# Patient Record
Sex: Male | Born: 1935
Health system: Southern US, Community
[De-identification: ages and names within clinical notes are randomized; demographics above are authoritative.]

## PROBLEM LIST (undated history)

## (undated) DIAGNOSIS — I2510 Atherosclerotic heart disease of native coronary artery without angina pectoris: Secondary | ICD-10-CM

## (undated) DIAGNOSIS — M1990 Unspecified osteoarthritis, unspecified site: Secondary | ICD-10-CM

## (undated) DIAGNOSIS — F32A Depression, unspecified: Secondary | ICD-10-CM

## (undated) DIAGNOSIS — I214 Non-ST elevation (NSTEMI) myocardial infarction: Secondary | ICD-10-CM

## (undated) DIAGNOSIS — I959 Hypotension, unspecified: Secondary | ICD-10-CM

## (undated) DIAGNOSIS — F419 Anxiety disorder, unspecified: Secondary | ICD-10-CM

## (undated) DIAGNOSIS — F329 Major depressive disorder, single episode, unspecified: Secondary | ICD-10-CM

## (undated) DIAGNOSIS — I10 Essential (primary) hypertension: Secondary | ICD-10-CM

## (undated) DIAGNOSIS — E7800 Pure hypercholesterolemia, unspecified: Secondary | ICD-10-CM

## (undated) DIAGNOSIS — R42 Dizziness and giddiness: Secondary | ICD-10-CM

## (undated) HISTORY — PX: JOINT REPLACEMENT: SHX530

## (undated) HISTORY — DX: Non-ST elevation (NSTEMI) myocardial infarction: I21.4

## (undated) HISTORY — DX: Atherosclerotic heart disease of native coronary artery without angina pectoris: I25.10

## (undated) HISTORY — DX: Dizziness and giddiness: R42

## (undated) HISTORY — DX: Depression, unspecified: F32.A

## (undated) HISTORY — DX: Hypotension, unspecified: I95.9

## (undated) HISTORY — PX: CORONARY ANGIOPLASTY: SHX604

## (undated) HISTORY — PX: KNEE SURGERY: SHX244

## (undated) HISTORY — PX: CATARACT EXTRACTION W/ INTRAOCULAR LENS  IMPLANT, BILATERAL: SHX1307

## (undated) HISTORY — PX: CARDIAC CATHETERIZATION: SHX172

## (undated) HISTORY — PX: HERNIA REPAIR: SHX51

---
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
## 1995-06-08 HISTORY — PX: SHOULDER SURGERY: SHX246

## 2000-07-25 ENCOUNTER — Encounter: Payer: Self-pay | Admitting: Specialist

## 2000-08-01 ENCOUNTER — Inpatient Hospital Stay (HOSPITAL_COMMUNITY): Admission: RE | Admit: 2000-08-01 | Discharge: 2000-08-02 | Payer: Self-pay | Admitting: Specialist

## 2003-04-19 ENCOUNTER — Encounter
Admission: RE | Admit: 2003-04-19 | Discharge: 2003-04-19 | Payer: Self-pay | Admitting: Physical Medicine and Rehabilitation

## 2010-03-31 ENCOUNTER — Inpatient Hospital Stay (HOSPITAL_COMMUNITY): Admission: RE | Admit: 2010-03-31 | Discharge: 2010-04-03 | Payer: Self-pay | Admitting: Orthopedic Surgery

## 2010-04-28 ENCOUNTER — Encounter
Admission: RE | Admit: 2010-04-28 | Discharge: 2010-06-04 | Payer: Self-pay | Source: Home / Self Care | Attending: Orthopedic Surgery | Admitting: Orthopedic Surgery

## 2010-05-28 ENCOUNTER — Encounter
Admission: RE | Admit: 2010-05-28 | Discharge: 2010-06-04 | Payer: Self-pay | Source: Home / Self Care | Attending: Orthopedic Surgery | Admitting: Orthopedic Surgery

## 2010-08-19 LAB — CBC
HCT: 30.1 % — ABNORMAL LOW (ref 39.0–52.0)
HCT: 39.3 % (ref 39.0–52.0)
Hemoglobin: 13.2 g/dL (ref 13.0–17.0)
MCH: 31.9 pg (ref 26.0–34.0)
MCHC: 33.6 g/dL (ref 30.0–36.0)
MCV: 94.9 fL (ref 78.0–100.0)
MCV: 95.6 fL (ref 78.0–100.0)
Platelets: 215 10*3/uL (ref 150–400)
Platelets: 215 10*3/uL (ref 150–400)
Platelets: 279 10*3/uL (ref 150–400)
RBC: 3.15 MIL/uL — ABNORMAL LOW (ref 4.22–5.81)
RBC: 3.46 MIL/uL — ABNORMAL LOW (ref 4.22–5.81)
RBC: 4.14 MIL/uL — ABNORMAL LOW (ref 4.22–5.81)
RDW: 13.5 % (ref 11.5–15.5)
RDW: 13.6 % (ref 11.5–15.5)
WBC: 10.8 10*3/uL — ABNORMAL HIGH (ref 4.0–10.5)
WBC: 12.3 10*3/uL — ABNORMAL HIGH (ref 4.0–10.5)
WBC: 12.7 10*3/uL — ABNORMAL HIGH (ref 4.0–10.5)

## 2010-08-19 LAB — URINALYSIS, ROUTINE W REFLEX MICROSCOPIC
Glucose, UA: NEGATIVE mg/dL
Nitrite: NEGATIVE
Protein, ur: NEGATIVE mg/dL
Urobilinogen, UA: 0.2 mg/dL (ref 0.0–1.0)

## 2010-08-19 LAB — BASIC METABOLIC PANEL
BUN: 32 mg/dL — ABNORMAL HIGH (ref 6–23)
CO2: 31 mEq/L (ref 19–32)
Calcium: 8.5 mg/dL (ref 8.4–10.5)
Calcium: 9.3 mg/dL (ref 8.4–10.5)
Chloride: 104 mEq/L (ref 96–112)
Chloride: 104 mEq/L (ref 96–112)
Creatinine, Ser: 1.47 mg/dL (ref 0.4–1.5)
Creatinine, Ser: 1.62 mg/dL — ABNORMAL HIGH (ref 0.4–1.5)
Creatinine, Ser: 1.71 mg/dL — ABNORMAL HIGH (ref 0.4–1.5)
GFR calc Af Amer: 46 mL/min — ABNORMAL LOW (ref >60)
GFR calc Af Amer: 49 mL/min — ABNORMAL LOW (ref >60)
GFR calc Af Amer: 55 mL/min — ABNORMAL LOW (ref >60)
GFR calc non Af Amer: 38 mL/min — ABNORMAL LOW (ref >60)
GFR calc non Af Amer: 46 mL/min — ABNORMAL LOW (ref >60)
Glucose, Bld: 85 mg/dL (ref 70–99)
Potassium: 4.4 mEq/L (ref 3.5–5.1)
Potassium: 4.7 mEq/L (ref 3.5–5.1)
Sodium: 140 mEq/L (ref 135–145)

## 2010-08-19 LAB — DIFFERENTIAL
Basophils Absolute: 0 10*3/uL (ref 0.0–0.1)
Basophils Relative: 0 % (ref 0–1)
Eosinophils Absolute: 0.1 10*3/uL (ref 0.0–0.7)
Eosinophils Relative: 1 % (ref 0–5)
Lymphocytes Relative: 20 % (ref 12–46)
Lymphs Abs: 2.2 10*3/uL (ref 0.7–4.0)
Monocytes Absolute: 0.8 10*3/uL (ref 0.1–1.0)
Monocytes Relative: 8 % (ref 3–12)
Neutro Abs: 7.6 10*3/uL (ref 1.7–7.7)
Neutrophils Relative %: 70 % (ref 43–77)

## 2010-08-19 LAB — PROTIME-INR
INR: 1.03 (ref 0.00–1.49)
Prothrombin Time: 13.7 seconds (ref 11.6–15.2)

## 2010-08-19 LAB — TYPE AND SCREEN
ABO/RH(D): O POS
Antibody Screen: NEGATIVE

## 2010-08-19 LAB — URINE MICROSCOPIC-ADD ON

## 2010-08-19 LAB — ABO/RH: ABO/RH(D): O POS

## 2010-10-23 NOTE — Op Note (Signed)
[facility]  Patient:    Leon Morris, Leon Morris                    MRN: [id number] Proc. Date: 08/01/00 Adm. Date:  [id number] Disc. Date: [id number] Attending:  Montez Morita, Philips John                           Operative Report  PREOPERATIVE DIAGNOSIS:  Rotator cuff tear, right shoulder.  POSTOPERATIVE DIAGNOSIS:  Severe massive rotator cuff tear of right shoulder.  OPERATION:  Anterior acromioplasty, distal clavicle excision, and rotator cuff repair.  SURGEON:  Philips J. Montez Morita, M.D.  ASSISTANT:  Alexzandrew L. Perkins, P.A.-C.  DESCRIPTION OF PROCEDURE:  After suitable general anesthesia, he was positioned in the shoulder frame and prepped and draped routinely.  An incision was made over the distal clavicle in line with the anterior acromion and about an inch into the deltoid.  The anterior acromion was exposed as was the tip of the clavicle, and both were removed with a saw, beveling the anterior acromion with protection by ends of a Cobb underneath.   Hemostasis all along with a Bovie and bone wax to the end of the clavicle.  A terrible rotator cuff tear with the infraspinatus tendons retracted, very difficult to get to.  Three longitudinal sutures were placed from anterior to posterior in a mattress type so they could be pulled on, and two horizontal sutures of #1 PDS were used to repair the longitudinal defects in three segments.  A semicircular trough was cut from anterior to posterior, and three drill holes were made.  The two sutures in the most posterior portion were passed through that posterior hole, and the two in the middle portion were passed through the middle hole, and in the anterior portion, one through the most anterior hole and one through the middle hole.  At this point, the shoulder was flexed and abducted, and the two sutures through the rear hole were tied to the two sutures through the middle hole, and then the front two sutures  were tied to each other, one having passed through the front hole and one through the middle.  Nice repair and nice pulling of the tendon back into it trough except at the very middle center, there was just a wee bit of gap.  There was nothing really there to close.  It was felt that the underneath portion adhered nicely.  Two drill holes were placed in the acromion for three sutures of PDS #1, and two additional sutures of PDS more proximal to that over the distal clavicle were placed.  A catheter was put in anteriorly, threaded in the pain pump catheter which was folded on itself and placed beneath the sutures.  Once the catheter was in place, the previously inserted sutures were tied down, reapproximating the deltoid.  Remnants of the bursa had been excised all along and were trimmed out.  The subcutaneum was closed with 2-0 PDS, and then 20 cc of 0.5% plain Marcaine was put about the area, and the pain pump was started. Closure of the skin with 4-0 running Monocryl, Steri-Strips, to recovery in good condition.DD:  08/01/00 TD:  08/02/00 Job: [id number]

## 2011-06-12 ENCOUNTER — Emergency Department (HOSPITAL_COMMUNITY): Payer: Medicare Other

## 2011-06-12 ENCOUNTER — Other Ambulatory Visit: Payer: Self-pay

## 2011-06-12 ENCOUNTER — Inpatient Hospital Stay (HOSPITAL_COMMUNITY)
Admission: EM | Admit: 2011-06-12 | Discharge: 2011-06-16 | DRG: 246 | Disposition: A | Discharge status: Home / Self Care | Payer: Medicare Other | Source: Ambulatory Visit | Attending: Cardiology | Admitting: Cardiology

## 2011-06-12 ENCOUNTER — Encounter: Payer: Self-pay | Admitting: *Deleted

## 2011-06-12 DIAGNOSIS — I214 Non-ST elevation (NSTEMI) myocardial infarction: Principal | ICD-10-CM

## 2011-06-12 DIAGNOSIS — Z7901 Long term (current) use of anticoagulants: Secondary | ICD-10-CM

## 2011-06-12 DIAGNOSIS — M129 Arthropathy, unspecified: Secondary | ICD-10-CM | POA: Diagnosis present

## 2011-06-12 DIAGNOSIS — Z79899 Other long term (current) drug therapy: Secondary | ICD-10-CM

## 2011-06-12 DIAGNOSIS — Z87891 Personal history of nicotine dependence: Secondary | ICD-10-CM

## 2011-06-12 DIAGNOSIS — D72829 Elevated white blood cell count, unspecified: Secondary | ICD-10-CM | POA: Diagnosis not present

## 2011-06-12 DIAGNOSIS — I10 Essential (primary) hypertension: Secondary | ICD-10-CM

## 2011-06-12 DIAGNOSIS — D649 Anemia, unspecified: Secondary | ICD-10-CM | POA: Diagnosis not present

## 2011-06-12 DIAGNOSIS — E785 Hyperlipidemia, unspecified: Secondary | ICD-10-CM

## 2011-06-12 DIAGNOSIS — Z6827 Body mass index (BMI) 27.0-27.9, adult: Secondary | ICD-10-CM

## 2011-06-12 DIAGNOSIS — R079 Chest pain, unspecified: Secondary | ICD-10-CM

## 2011-06-12 DIAGNOSIS — I2510 Atherosclerotic heart disease of native coronary artery without angina pectoris: Secondary | ICD-10-CM

## 2011-06-12 DIAGNOSIS — F411 Generalized anxiety disorder: Secondary | ICD-10-CM | POA: Diagnosis present

## 2011-06-12 DIAGNOSIS — Z7982 Long term (current) use of aspirin: Secondary | ICD-10-CM

## 2011-06-12 HISTORY — DX: Essential (primary) hypertension: I10

## 2011-06-12 HISTORY — DX: Pure hypercholesterolemia, unspecified: E78.00

## 2011-06-12 HISTORY — DX: Unspecified osteoarthritis, unspecified site: M19.90

## 2011-06-12 HISTORY — DX: Anxiety disorder, unspecified: F41.9

## 2011-06-12 LAB — POCT I-STAT, CHEM 8
BUN: 25 mg/dL — ABNORMAL HIGH (ref 6–23)
Calcium, Ion: 1.15 mmol/L (ref 1.12–1.32)
Chloride: 102 mEq/L (ref 96–112)
HCT: 39 % (ref 39.0–52.0)
Potassium: 3.9 mEq/L (ref 3.5–5.1)
Sodium: 139 mEq/L (ref 135–145)

## 2011-06-12 LAB — COMPREHENSIVE METABOLIC PANEL
Albumin: 3.5 g/dL (ref 3.5–5.2)
BUN: 21 mg/dL (ref 6–23)
Chloride: 102 mEq/L (ref 96–112)
Creatinine, Ser: 1.29 mg/dL (ref 0.50–1.35)
GFR calc Af Amer: 56 mL/min — ABNORMAL LOW (ref >90)
Glucose, Bld: 116 mg/dL — ABNORMAL HIGH (ref 70–99)
Total Bilirubin: 0.7 mg/dL (ref 0.3–1.2)
Total Protein: 6.9 g/dL (ref 6.0–8.3)

## 2011-06-12 LAB — DIFFERENTIAL
Basophils Absolute: 0 10*3/uL (ref 0.0–0.1)
Basophils Relative: 0 % (ref 0–1)
Eosinophils Relative: 1 % (ref 0–5)
Lymphocytes Relative: 21 % (ref 12–46)
Lymphs Abs: 2.1 10*3/uL (ref 0.7–4.0)
Lymphs Abs: 2.5 10*3/uL (ref 0.7–4.0)
Monocytes Absolute: 1.1 10*3/uL — ABNORMAL HIGH (ref 0.1–1.0)
Monocytes Relative: 9 % (ref 3–12)
Neutro Abs: 8.7 10*3/uL — ABNORMAL HIGH (ref 1.7–7.7)
Neutro Abs: 8.2 10*3/uL — ABNORMAL HIGH (ref 1.7–7.7)
Neutrophils Relative %: 72 % (ref 43–77)

## 2011-06-12 LAB — CBC
HCT: 38.3 % — ABNORMAL LOW (ref 39.0–52.0)
Hemoglobin: 12.6 g/dL — ABNORMAL LOW (ref 13.0–17.0)
MCHC: 32.9 g/dL (ref 30.0–36.0)
MCV: 92.9 fL (ref 78.0–100.0)
Platelets: 271 10*3/uL (ref 150–400)
RBC: 4.09 MIL/uL — ABNORMAL LOW (ref 4.22–5.81)
RBC: 4.2 MIL/uL — ABNORMAL LOW (ref 4.22–5.81)
WBC: 11.6 10*3/uL — ABNORMAL HIGH (ref 4.0–10.5)

## 2011-06-12 LAB — CARDIAC PANEL(CRET KIN+CKTOT+MB+TROPI)
CK, MB: 6.1 ng/mL (ref 0.3–4.0)
CK, MB: 5.2 ng/mL — ABNORMAL HIGH (ref 0.3–4.0)
Relative Index: INVALID (ref 0.0–2.5)
Total CK: 92 U/L (ref 7–232)
Troponin I: 0.6 ng/mL (ref <0.30)
Troponin I: 0.52 ng/mL (ref <0.30)

## 2011-06-12 LAB — APTT
aPTT: 30 seconds (ref 24–37)
aPTT: 101 seconds — ABNORMAL HIGH (ref 24–37)

## 2011-06-12 LAB — POCT I-STAT TROPONIN I: Troponin i, poc: 0.17 ng/mL (ref 0.00–0.08)

## 2011-06-12 LAB — HEPARIN LEVEL (UNFRACTIONATED)
Heparin Unfractionated: 0.51 IU/mL (ref 0.30–0.70)
Heparin Unfractionated: 0.27 IU/mL — ABNORMAL LOW (ref 0.30–0.70)

## 2011-06-12 LAB — PROTIME-INR: INR: 1.01 (ref 0.00–1.49)

## 2011-06-12 LAB — HEMOGLOBIN A1C
Hgb A1c MFr Bld: 6 % — ABNORMAL HIGH (ref <5.7)
Mean Plasma Glucose: 126 mg/dL — ABNORMAL HIGH (ref <117)

## 2011-06-12 MED ORDER — SODIUM CHLORIDE 0.9 % IV SOLN
INTRAVENOUS | Status: DC
  Administered 2011-06-12: 06:00:00 via INTRAVENOUS

## 2011-06-12 MED ORDER — SODIUM CHLORIDE 0.9 % IV SOLN: 1.0000 mL/kg/h | INTRAVENOUS | Status: DC

## 2011-06-12 MED ORDER — ACETAMINOPHEN 325 MG PO TABS: 650.0000 mg | ORAL_TABLET | ORAL | Status: DC | PRN

## 2011-06-12 MED ORDER — ALUM & MAG HYDROXIDE-SIMETH 200-200-20 MG/5ML PO SUSP
30.0000 mL | Freq: Once | ORAL | Status: AC
  Administered 2011-06-12: 30 mL via ORAL
  Filled 2011-06-12: qty 30

## 2011-06-12 MED ORDER — ONDANSETRON HCL 4 MG/2ML IJ SOLN
4.0000 mg | Freq: Four times a day (QID) | INTRAMUSCULAR | Status: DC | PRN
  Administered 2011-06-14: 4 mg via INTRAVENOUS
  Filled 2011-06-12: qty 2

## 2011-06-12 MED ORDER — TICAGRELOR 90 MG PO TABS
180.0000 mg | ORAL_TABLET | ORAL | Status: AC
  Administered 2011-06-12: 180 mg via ORAL
  Filled 2011-06-12: qty 2

## 2011-06-12 MED ORDER — SODIUM CHLORIDE 0.9 % IV SOLN
250.0000 mL | INTRAVENOUS | Status: DC | PRN
  Administered 2011-06-12: 1000 mL via INTRAVENOUS
  Administered 2011-06-13: 250 mL via INTRAVENOUS

## 2011-06-12 MED ORDER — ASPIRIN 300 MG RE SUPP: 300.0000 mg | RECTAL | Status: DC

## 2011-06-12 MED ORDER — HEPARIN SOD (PORCINE) IN D5W 100 UNIT/ML IV SOLN
INTRAVENOUS | Status: AC
  Filled 2011-06-12: qty 250

## 2011-06-12 MED ORDER — SODIUM CHLORIDE 0.9 % IJ SOLN
3.0000 mL | Freq: Two times a day (BID) | INTRAMUSCULAR | Status: DC
Start: 2011-06-12 — End: 2011-06-16
  Administered 2011-06-13 – 2011-06-16 (×6): 3 mL via INTRAVENOUS

## 2011-06-12 MED ORDER — SODIUM CHLORIDE 0.9 % IJ SOLN: 3.0000 mL | INTRAMUSCULAR | Status: DC | PRN

## 2011-06-12 MED ORDER — HEPARIN (PORCINE) IN NACL 100-0.45 UNIT/ML-% IJ SOLN
1200.0000 [IU]/h | INTRAMUSCULAR | Status: DC
  Administered 2011-06-12 (×2): 1000 [IU]/h via INTRAVENOUS
  Filled 2011-06-12 (×3): qty 250

## 2011-06-12 MED ORDER — ROSUVASTATIN CALCIUM 20 MG PO TABS
20.0000 mg | ORAL_TABLET | Freq: Every day | ORAL | Status: DC
  Administered 2011-06-12 – 2011-06-14 (×3): 20 mg via ORAL
  Filled 2011-06-12 (×4): qty 1

## 2011-06-12 MED ORDER — ASPIRIN EC 81 MG PO TBEC
81.0000 mg | DELAYED_RELEASE_TABLET | Freq: Every day | ORAL | Status: DC
  Administered 2011-06-13 – 2011-06-16 (×4): 81 mg via ORAL
  Filled 2011-06-12 (×5): qty 1

## 2011-06-12 MED ORDER — NITROGLYCERIN 0.4 MG SL SUBL
0.4000 mg | SUBLINGUAL_TABLET | SUBLINGUAL | Status: DC | PRN
  Administered 2011-06-13 (×2): 0.4 mg via SUBLINGUAL
  Filled 2011-06-12 (×4): qty 25

## 2011-06-12 MED ORDER — CITALOPRAM HYDROBROMIDE 10 MG PO TABS
10.0000 mg | ORAL_TABLET | Freq: Every day | ORAL | Status: DC
  Administered 2011-06-12 – 2011-06-16 (×5): 10 mg via ORAL
  Filled 2011-06-12 (×5): qty 1

## 2011-06-12 MED ORDER — ASPIRIN 81 MG PO CHEW
324.0000 mg | CHEWABLE_TABLET | ORAL | Status: DC
  Filled 2011-06-12: qty 1

## 2011-06-12 MED ORDER — HEPARIN BOLUS VIA INFUSION
4000.0000 [IU] | Freq: Once | INTRAVENOUS | Status: AC
  Administered 2011-06-12: 4000 [IU] via INTRAVENOUS

## 2011-06-12 MED ORDER — TICAGRELOR 90 MG PO TABS
90.0000 mg | ORAL_TABLET | Freq: Two times a day (BID) | ORAL | Status: DC
  Administered 2011-06-13: 90 mg via ORAL
  Filled 2011-06-12 (×2): qty 1

## 2011-06-12 MED ORDER — ZOLPIDEM TARTRATE 5 MG PO TABS
5.0000 mg | ORAL_TABLET | Freq: Every evening | ORAL | Status: DC | PRN
  Administered 2011-06-12: 5 mg via ORAL
  Filled 2011-06-12: qty 1

## 2011-06-12 MED ORDER — SODIUM CHLORIDE 0.9 % IJ SOLN
3.0000 mL | Freq: Two times a day (BID) | INTRAMUSCULAR | Status: DC
  Administered 2011-06-13 – 2011-06-14 (×2): 3 mL via INTRAVENOUS

## 2011-06-12 MED ORDER — METOPROLOL TARTRATE 25 MG PO TABS
25.0000 mg | ORAL_TABLET | Freq: Two times a day (BID) | ORAL | Status: DC
Start: 2011-06-12 — End: 2011-06-16
  Administered 2011-06-12 – 2011-06-15 (×6): 25 mg via ORAL
  Filled 2011-06-12 (×13): qty 1

## 2011-06-12 NOTE — Progress Notes (Signed)
ANTICOAGULATION CONSULT NOTE - Initial Consult  Pharmacy Consult for Heparin Indication: chest pain/ACS  Assessment: 78 y.o. M who was initiated on heparin for CP/ACS this a.m. The patient received a 4000 unit bolus and was started on drip at rate of 1000 units/hr in the ED by the physician however pharmacy now asked to dose heparin. Per discussion with patient, no recent surgeries or hx CVA. Hgb/Hct/Plt ok. No bleeding noted at this time. Heparin level ~8 hours after drip initiated was therapeutic at 0.51 (goal of 0.3-0.7).   Goal of Therapy:  Heparin level 0.3-0.7 units/ml   Plan:  1. Continue heparin drip at rate of 1000 units/hr (10 ml/hr) 2. Daily heparin levels 3. Will continue to monitor for any signs/symptoms of bleeding and will follow up with heparin level in 8 hours to confirm level therapeutic.   Georgina Pillion, PharmD, BCPS 06/12/2011 4:33 PM     No Known Allergies  Patient Measurements: Height: 5\' 3"  (160 cm) Weight: 147 lb (66.679 kg) IBW/kg (Calculated) : 56.9  Heparin Dosing Weight: 66.7 kg   Vital Signs: Temp: 97.9 F (36.6 C) (01/05 0522) Temp src: Oral (01/05 0522) BP: 149/68 mmHg (01/05 1527) Pulse Rate: 71  (01/05 1527)  Labs:  Basename 06/12/11 1413 06/12/11 1400 06/12/11 1131 06/12/11 0550 06/12/11 0517  HGB 13.3 -- -- 13.3 --  HCT 39.0 -- -- 39.0 38.3*  PLT 271 -- -- -- 268  APTT 101* -- -- -- 30  LABPROT 14.1 -- -- -- 13.5  INR 1.07 -- -- -- 1.01  HEPARINUNFRC -- 0.51 -- -- --  CREATININE 1.29 -- -- 1.40* --  CKTOTAL -- -- 94 -- --  CKMB -- -- 6.1* -- --  TROPONINI -- -- 0.60* -- --   Estimated Creatinine Clearance: 33.1 ml/min (by C-G formula based on Cr of 1.29).  Medical History: Past Medical History  Diagnosis Date  . Anxiety   . Hypertension   . Hypercholesterolemia     Medications:   (Not in a hospital admission) Scheduled:    . alum & mag hydroxide-simeth  30 mL Oral Once  . aspirin  324 mg Oral NOW   Or  . aspirin   300 mg Rectal NOW  . aspirin EC  81 mg Oral Daily  . citalopram  10 mg Oral Daily  . heparin      . heparin  4,000 Units Intravenous Once  . metoprolol tartrate  25 mg Oral BID  . rosuvastatin  20 mg Oral Daily  . sodium chloride  3 mL Intravenous Q12H  . sodium chloride  3 mL Intravenous Q12H  . Ticagrelor  180 mg Oral To Major   Followed by  . Ticagrelor  90 mg Oral BID

## 2011-06-12 NOTE — ED Notes (Signed)
Patient transported to X-ray 

## 2011-06-12 NOTE — ED Notes (Signed)
Family at bedside. 

## 2011-06-12 NOTE — ED Notes (Signed)
Per Lanora Manis in Pharmacy. Keep heparin infusion at the same rate.

## 2011-06-12 NOTE — ED Notes (Signed)
Troponin of .60 and ckmb of6.1 reported to Plunk[place],pa

## 2011-06-12 NOTE — ED Notes (Signed)
Meal tray delivered to pt room at this time.

## 2011-06-12 NOTE — Progress Notes (Signed)
  Patient Name: Zaine Elsass [facility]      SUBJECTIVE: No further chest pain.  Past Medical History  Diagnosis Date  . Anxiety   . Hypertension   . Hypercholesterolemia     PHYSICAL EXAM Filed Vitals:   06/12/11 1200 06/12/11 1300 06/12/11 1400 06/12/11 1527  BP: 146/82 130/80 159/83 149/68  Pulse: 64 64 67 71  Temp:      TempSrc:      Resp: 23 21 13    Height:  5\' 3"  (1.6 m)    Weight:  147 lb (66.679 kg)    SpO2: 98% 98% 99% 97%     Well developed and nourished in no acute distress HENT normal Neck supple with JVP-flat Carotids brisk and full without bruits Clear Regular rate and rhythm, no murmurs or gallops Abd-soft with active BS without hepatomegaly No Clubbing cyanosis edema Skin-warm and dry A & Oriented  Grossly normal sensory and motor function    TELEMETRY: Reviewed telemetry pt in nsr :   No intake or output data in the 24 hours ending 06/12/11 1609  LABS: Basic Metabolic Panel:  Lab 06/12/11 1610 06/12/11 0550  NA 138 139  K 4.0 3.9  CL 102 102  CO2 26 --  GLUCOSE 116* 102*  BUN 21 25*  CREATININE 1.29 1.40*  CALCIUM 9.3 --  MG -- --  PHOS -- --   Cardiac Enzymes:  Basename 06/12/11 1131  CKTOTAL 94  CKMB 6.1*  CKMBINDEX --  TROPONINI 0.60*   CBC:  Lab 06/12/11 1413 06/12/11 0550 06/12/11 0517  WBC 11.6* -- 12.1*  NEUTROABS 8.2* -- 8.7*  HGB 13.3 13.3 12.6*  HCT 39.0 39.0 38.3*  MCV 92.9 -- 93.6  PLT 271 -- 268   PROTIME:  Basename 06/12/11 1413 06/12/11 0517  LABPROT 14.1 13.5  INR 1.07 1.01   Liver Function Tests:  Basename 06/12/11 1413  AST 22  ALT 11  ALKPHOS 89  BILITOT 0.7  PROT 6.9  ALBUMIN 3.5       ASSESSMENT AND PLAN: The patient presented with new onset exertional chest discomfort associated with positive enzymes. His electrocardiogram was normal. He is on heparin and aspirin. We'll add tricagelor will anticipate catheterization on Monday     Signed, Sherryl Manges MD  06/12/2011

## 2011-06-12 NOTE — ED Notes (Signed)
Per EMS:  Pt c/o intermittent CP since 9 am this morning that was pressure in his left chest and radiating down left arm.  NO n/v/d, diaphoresis.  Pain associated with SOB.  No hx ofo angina, MI.

## 2011-06-12 NOTE — ED Notes (Signed)
Meal tray ordered for pt at this time.

## 2011-06-12 NOTE — ED Provider Notes (Signed)
History     CSN: [id number]  Arrival date & time 06/12/11  0451   First MD Initiated Contact with Patient 06/12/11 0507      Chief Complaint  Patient presents with  . Chest Pain    (Consider location/radiation/quality/duration/timing/severity/associated sxs/prior treatment) The history is provided by the patient.   patient's had episodes of chest pain since yesterday. Began yesterday while at the gym. He states he can normally ride the bike for 30 minutes, yesterday at 15 minutes developed left-sided chest pain. It went away with rest. Today the pain has been more frequent. The gym is only to 3 minutes on the bike. He's also had the pain come on at rest and with other exertions. He is pain-free now. The pain will comply. He does not smoke. He has a history of high cholesterol. He does not have any coronary disease he knows of. No cough. The pain is associated with shortness of breath.  Past Medical History  Diagnosis Date  . Anxiety   . Hypertension   . Hypercholesterolemia     Past Surgical History  Procedure Date  . Knee surgery   . Shoulder surgery     No family history on file.  History  Substance Use Topics  . Smoking status: Not on file  . Smokeless tobacco: Not on file  . Alcohol Use:       Review of Systems  Constitutional: Negative for activity change and appetite change.  HENT: Negative for neck stiffness.   Eyes: Negative for pain.  Respiratory: Negative for chest tightness and shortness of breath.   Cardiovascular: Positive for chest pain. Negative for leg swelling.  Gastrointestinal: Negative for nausea, vomiting, abdominal pain and diarrhea.  Genitourinary: Negative for flank pain.  Musculoskeletal: Negative for back pain.  Skin: Negative for rash.  Neurological: Negative for weakness, numbness and headaches.  Psychiatric/Behavioral: Negative for behavioral problems.    Allergies  Review of patient's allergies indicates no known allergies.  Home  Medications   Current Outpatient Rx  Name Route Sig Dispense Refill  . CITALOPRAM HYDROBROMIDE 10 MG PO TABS Oral Take 10 mg by mouth daily.      Marland Kitchen SIMVASTATIN 10 MG PO TABS Oral Take 10 mg by mouth at bedtime.        BP 141/84  Pulse 63  Temp(Src) 97.9 F (36.6 C) (Oral)  Resp 20  SpO2 100%  Physical Exam  Nursing note and vitals reviewed. Constitutional: He is oriented to person, place, and time. He appears well-developed and well-nourished.  HENT:  Head: Normocephalic and atraumatic.  Eyes: EOM are normal. Pupils are equal, round, and reactive to light.  Neck: Normal range of motion. Neck supple.  Cardiovascular: Normal rate, regular rhythm and normal heart sounds.   No murmur heard. Pulmonary/Chest: Effort normal and breath sounds normal.  Abdominal: Soft. Bowel sounds are normal. He exhibits no distension and no mass. There is no tenderness. There is no rebound and no guarding.  Musculoskeletal: Normal range of motion. He exhibits no edema.  Neurological: He is alert and oriented to person, place, and time. No cranial nerve deficit.  Skin: Skin is warm and dry.  Psychiatric: He has a normal mood and affect.    ED Course  Procedures (including critical care time)  Labs Reviewed  POCT I-STAT, CHEM 8 - Abnormal; Notable for the following:    BUN 25 (*)    Creatinine, Ser 1.40 (*)    Glucose, Bld 102 (*)  All other components within normal limits  POCT I-STAT TROPONIN I - Abnormal; Notable for the following:    Troponin i, poc 0.17 (*)    All other components within normal limits  CBC  DIFFERENTIAL  I-STAT, CHEM 8  PROTIME-INR  APTT  I-STAT TROPONIN I   No results found.   1. Non-STEMI (non-ST elevated myocardial infarction)      Date: 06/12/2011  Rate: 61  Rhythm: normal sinus rhythm  QRS Axis: normal  Intervals: normal  ST/T Wave abnormalities: normal  Conduction Disutrbances:none  Narrative Interpretation:   Old EKG Reviewed:  unchanged  CRITICAL CARE Performed by: Billee Cashing   Total critical care time: 30  Critical care time was exclusive of separately billable procedures and treating other patients.  Critical care was necessary to treat or prevent imminent or life-threatening deterioration.  Critical care was time spent personally by me on the following activities: development of treatment plan with patient and/or surrogate as well as nursing, discussions with consultants, evaluation of patient's response to treatment, examination of patient, obtaining history from patient or surrogate, ordering and performing treatments and interventions, ordering and review of laboratory studies, ordering and review of radiographic studies, pulse oximetry and re-evaluation of patient's condition.  MDM  Patient presents with left-sided chest pain for the last 2 days. It initially started after 15 minutes on an exercise bike, then 3 minutes, then at rest. Pain-free now. It woke him tonight. He describes the pain as crushing left-sided. Patient has a good story for unstable angina. He was given aspirin by EMS. Initial troponin was slightly elevated. He'll be admitted to cardiology. His pain-free now. Heparin has been started.        Juliet Rude. Rubin Payor, MD 06/12/11 [phone]

## 2011-06-12 NOTE — H&P (Signed)
Leon Morris is an 78 y.o. male.    Chief Complaint: Chest pain  HPI: 78 y/o male with a PMH of hyperlipidemia presenting with a two day history of escalating angina.  He states that since 1983 he has consistently been able to exercise for 30 to 45 minutes by lifting weights and riding on a stationary bicycle without having any symptoms.  Two days prior to presentation, he complained of chest pain, which he describes as "something sitting on my chest" that started about 15 minutes after he started to exercise.  The chest pain lasted about 20 minutes before spontaneous relief.  Yesterday, he noted that he could only exercise for 3 minutes before experiencing similar chest pain.  His chest pain is associated with shortness of breath, and he has noted that he now has PND and orthopnea for the last 2 days.  He denies any radiation, diaphoresis, palpitation or syncope.  In the ER, his ECG did not show any evidence of acute ischemia.  He is currently chest pain free.   Past Medical History  Diagnosis Date  . Anxiety   . Hypertension   . Hypercholesterolemia     Past Surgical History  Procedure Date  . Knee surgery   . Shoulder surgery     No family history on file. Social History:  does not have a smoking history on file. He does not have any smokeless tobacco history on file. His alcohol and drug histories not on file.  Allergies: No Known Allergies  Medications Prior to Admission  Medication Dose Route Frequency Provider Last Rate Last Dose  . 0.9 %  sodium chloride infusion   Intravenous Continuous Juliet Rude. Pickering, MD 125 mL/hr at 06/12/11 0547    . heparin 100 UNIT/ML infusion           . heparin ADULT infusion 100 units/mL (25000 units/250 mL)  1,000 Units/hr Intravenous Continuous Juliet Rude. Pickering, MD 10 mL/hr at 06/12/11 0642 1,000 Units/hr at 06/12/11 0642  . heparin bolus via infusion 4,000 Units  4,000 Units Intravenous Once Nathan R. Rubin Payor, MD   4,000 Units at  06/12/11 0640   No current outpatient prescriptions on file as of 06/12/2011.    Results for orders placed during the hospital encounter of 06/12/11 (from the past 48 hour(s))  CBC     Status: Abnormal   Collection Time   06/12/11  5:17 AM      Component Value Range Comment   WBC 12.1 (*) 4.0 - 10.5 (K/uL)    RBC 4.09 (*) 4.22 - 5.81 (MIL/uL)    Hemoglobin 12.6 (*) 13.0 - 17.0 (g/dL)    HCT 40.9 (*) 81.1 - 52.0 (%)    MCV 93.6  78.0 - 100.0 (fL)    MCH 30.8  26.0 - 34.0 (pg)    MCHC 32.9  30.0 - 36.0 (g/dL)    RDW 91.4  78.2 - 95.6 (%)    Platelets 268  150 - 400 (K/uL)   DIFFERENTIAL     Status: Abnormal   Collection Time   06/12/11  5:17 AM      Component Value Range Comment   Neutrophils Relative 72  43 - 77 (%)    Neutro Abs 8.7 (*) 1.7 - 7.7 (K/uL)    Lymphocytes Relative 17  12 - 46 (%)    Lymphs Abs 2.1  0.7 - 4.0 (K/uL)    Monocytes Relative 9  3 - 12 (%)    Monocytes Absolute 1.1 (*)  0.1 - 1.0 (K/uL)    Eosinophils Relative 1  0 - 5 (%)    Eosinophils Absolute 0.2  0.0 - 0.7 (K/uL)    Basophils Relative 0  0 - 1 (%)    Basophils Absolute 0.0  0.0 - 0.1 (K/uL)   PROTIME-INR     Status: Normal   Collection Time   06/12/11  5:17 AM      Component Value Range Comment   Prothrombin Time 13.5  11.6 - 15.2 (seconds)    INR 1.01  0.00 - 1.49    APTT     Status: Normal   Collection Time   06/12/11  5:17 AM      Component Value Range Comment   aPTT 30  24 - 37 (seconds)   POCT I-STAT TROPONIN I     Status: Abnormal   Collection Time   06/12/11  5:47 AM      Component Value Range Comment   Troponin i, poc 0.17 (*) 0.00 - 0.08 (ng/mL)    Comment NOTIFIED PHYSICIAN      Comment 3            POCT I-STAT, CHEM 8     Status: Abnormal   Collection Time   06/12/11  5:50 AM      Component Value Range Comment   Sodium 139  135 - 145 (mEq/L)    Potassium 3.9  3.5 - 5.1 (mEq/L)    Chloride 102  96 - 112 (mEq/L)    BUN 25 (*) 6 - 23 (mg/dL)    Creatinine, Ser 4.09 (*) 0.50 - 1.35  (mg/dL)    Glucose, Bld 811 (*) 70 - 99 (mg/dL)    Calcium, Ion 9.14  1.12 - 1.32 (mmol/L)    TCO2 27  0 - 100 (mmol/L)    Hemoglobin 13.3  13.0 - 17.0 (g/dL)    HCT 78.2  95.6 - 21.3 (%)    Dg Chest Port 1 View  06/12/2011  *RADIOLOGY REPORT*  Clinical Data: Shortness of breath, chest pain.  PORTABLE CHEST - 1 VIEW  Comparison: None.  Findings: Heart size upper normal limits to mildly enlarged.  Mild eventration of the right hemi diaphragm.  Linear lung base opacities are mild.  Otherwise, no focal areas of consolidation. Limited osseous evaluation by portable technique demonstrates no definite acute osseous abnormality. Right AC joint DJD.  IMPRESSION: Heart size upper normal limits to mildly enlarged.  Minimal linear lung base opacities, favored to represent scarring or atelectasis.  Original Report Authenticated By: Waneta Martins, M.D.    Review of Systems  Constitutional: Negative.   HENT: Negative.   Eyes: Negative.   Respiratory: Negative.   Cardiovascular: Positive for chest pain and PND. Negative for palpitations, orthopnea, claudication and leg swelling.  Gastrointestinal: Negative.   Musculoskeletal: Negative.   Skin: Negative.   Neurological: Negative.   Endo/Heme/Allergies: Negative.   Psychiatric/Behavioral: Negative.     Blood pressure 166/75, pulse 73, temperature 97.9 F (36.6 C), temperature source Oral, resp. rate 18, SpO2 100.00%. Physical Exam  Constitutional: He is oriented to person, place, and time. He appears well-developed and well-nourished. No distress.  HENT:  Head: Normocephalic.  Eyes: EOM are normal.  Neck: Normal range of motion. Neck supple.  Cardiovascular: Normal rate and regular rhythm.  Exam reveals no gallop and no friction rub.   No murmur heard. Respiratory: Effort normal and breath sounds normal.  GI: Soft. Bowel sounds are normal.  Musculoskeletal: Normal range of  motion.  Neurological: He is alert and oriented to person, place, and  time.  Skin: He is not diaphoretic.  Psychiatric: He has a normal mood and affect.     Assessment/Plan  1. Unstable angina 2. Hyperlipidemia  I will admit patient to cardiology and obtain 3 sets of cardiac markers.  He is already on Aspirin and Heparin drip since he has typical symptoms.  I will start him on beta-blockers and change his statin from Simvastatin to Rosuvastatin.  I will obtain fasting lipids and 2-D echo tomorrow to evaluate his lipids and LV function.  He will undergo a cardiac cath on Monday to define his coronaries.  If he develops uncontrolled chest pain and/or becomes hemodynamically unstable, will send him to the cath lab sooner.  Florentina Marquart E 06/12/2011, 7:23 AM

## 2011-06-13 ENCOUNTER — Other Ambulatory Visit: Payer: Self-pay

## 2011-06-13 ENCOUNTER — Inpatient Hospital Stay (HOSPITAL_COMMUNITY): Payer: Medicare Other

## 2011-06-13 ENCOUNTER — Encounter (HOSPITAL_COMMUNITY)
Admission: EM | Disposition: A | Discharge status: Home / Self Care | Payer: Self-pay | Source: Ambulatory Visit | Attending: Cardiology

## 2011-06-13 DIAGNOSIS — I517 Cardiomegaly: Secondary | ICD-10-CM

## 2011-06-13 DIAGNOSIS — I2510 Atherosclerotic heart disease of native coronary artery without angina pectoris: Secondary | ICD-10-CM

## 2011-06-13 HISTORY — PX: LEFT HEART CATHETERIZATION WITH CORONARY ANGIOGRAM: SHX5451

## 2011-06-13 LAB — CBC
Hemoglobin: 12.1 g/dL — ABNORMAL LOW (ref 13.0–17.0)
MCH: 31.3 pg (ref 26.0–34.0)
MCHC: 33.4 g/dL (ref 30.0–36.0)
Platelets: 246 10*3/uL (ref 150–400)

## 2011-06-13 LAB — LIPID PANEL
LDL Cholesterol: 72 mg/dL (ref 0–99)
Total CHOL/HDL Ratio: 3.2 RATIO
Triglycerides: 84 mg/dL (ref <150)
VLDL: 17 mg/dL (ref 0–40)

## 2011-06-13 LAB — CARDIAC PANEL(CRET KIN+CKTOT+MB+TROPI)
CK, MB: 5.4 ng/mL — ABNORMAL HIGH (ref 0.3–4.0)
Total CK: 101 U/L (ref 7–232)

## 2011-06-13 LAB — HEPARIN LEVEL (UNFRACTIONATED): Heparin Unfractionated: 0.64 IU/mL (ref 0.30–0.70)

## 2011-06-13 SURGERY — LEFT HEART CATHETERIZATION WITH CORONARY ANGIOGRAM
Anesthesia: LOCAL

## 2011-06-13 MED ORDER — NITROGLYCERIN 0.2 MG/ML ON CALL CATH LAB
INTRAVENOUS | Status: AC
  Filled 2011-06-13: qty 1

## 2011-06-13 MED ORDER — NITROGLYCERIN IN D5W 200-5 MCG/ML-% IV SOLN
INTRAVENOUS | Status: AC
  Filled 2011-06-13: qty 250

## 2011-06-13 MED ORDER — LIDOCAINE HCL (PF) 1 % IJ SOLN
INTRAMUSCULAR | Status: AC
  Filled 2011-06-13: qty 30

## 2011-06-13 MED ORDER — SODIUM CHLORIDE 0.9 % IV SOLN
1.0000 mL/kg/h | INTRAVENOUS | Status: AC
  Administered 2011-06-13: 1 mL/kg/h via INTRAVENOUS

## 2011-06-13 MED ORDER — NITROGLYCERIN 0.4 MG SL SUBL
0.4000 mg | SUBLINGUAL_TABLET | SUBLINGUAL | Status: DC | PRN
  Administered 2011-06-13 – 2011-06-14 (×4): 0.4 mg via SUBLINGUAL
  Filled 2011-06-13 (×3): qty 25
  Filled 2011-06-13: qty 50

## 2011-06-13 MED ORDER — VERAPAMIL HCL 2.5 MG/ML IV SOLN
INTRAVENOUS | Status: AC
  Filled 2011-06-13: qty 2

## 2011-06-13 MED ORDER — LORAZEPAM 0.5 MG PO TABS: 0.5000 mg | ORAL_TABLET | Freq: Four times a day (QID) | ORAL | Status: DC | PRN

## 2011-06-13 MED ORDER — MORPHINE SULFATE 4 MG/ML IJ SOLN
4.0000 mg | INTRAMUSCULAR | Status: DC | PRN
  Administered 2011-06-14: 4 mg via INTRAVENOUS
  Filled 2011-06-13: qty 1

## 2011-06-13 MED ORDER — HEPARIN SODIUM (PORCINE) 1000 UNIT/ML IJ SOLN
INTRAMUSCULAR | Status: AC
  Filled 2011-06-13: qty 1

## 2011-06-13 MED ORDER — NITROGLYCERIN IN D5W 200-5 MCG/ML-% IV SOLN
2.0000 ug/min | INTRAVENOUS | Status: DC
  Administered 2011-06-13: 30 ug/min via INTRAVENOUS
  Administered 2011-06-13: 10:00:00 via INTRAVENOUS
  Administered 2011-06-14: 80 ug/min via INTRAVENOUS
  Administered 2011-06-15: 50 ug/min via INTRAVENOUS
  Filled 2011-06-13 (×2): qty 250

## 2011-06-13 MED ORDER — MORPHINE SULFATE 10 MG/ML IJ SOLN
INTRAMUSCULAR | Status: AC
  Filled 2011-06-13: qty 1

## 2011-06-13 MED ORDER — HEPARIN SOD (PORCINE) IN D5W 100 UNIT/ML IV SOLN
1350.0000 [IU]/h | INTRAVENOUS | Status: DC
  Administered 2011-06-14: 1200 [IU]/h via INTRAVENOUS
  Filled 2011-06-13 (×3): qty 250

## 2011-06-13 MED ORDER — FENTANYL CITRATE 0.05 MG/ML IJ SOLN
INTRAMUSCULAR | Status: AC
  Filled 2011-06-13: qty 2

## 2011-06-13 MED ORDER — MIDAZOLAM HCL 2 MG/2ML IJ SOLN
INTRAMUSCULAR | Status: AC
  Filled 2011-06-13: qty 2

## 2011-06-13 MED ORDER — HEPARIN (PORCINE) IN NACL 2-0.9 UNIT/ML-% IJ SOLN
INTRAMUSCULAR | Status: AC
  Filled 2011-06-13: qty 2000

## 2011-06-13 NOTE — Progress Notes (Signed)
12:18 AM   Heparin level now 0.27. (last level most likely bolus effect)   Increase to 1200/hr f/u scheduled am lab.  No bleeding reported   Janice Coffin

## 2011-06-13 NOTE — Op Note (Signed)
Cardiac Catheterization Procedure Note  Name: Leon Morris MRN: [id number] DOB: 1922/06/08  Procedure: Left Heart Cath, Selective Coronary Angiography, LV angiography  Indication: 78 year old white male who presents with a non-ST elevation myocardial infarction. He has refractory chest pain at rest despite optimal medical therapy.   Procedural Details: The right wrist was prepped, draped, and anesthetized with 1% lidocaine. Using the modified Seldinger technique, a 6 French sheath was introduced into the right radial artery. 3 mg of verapamil was administered through the sheath, weight-based unfractionated heparin was administered intravenously. Standard Judkins catheters were used for selective coronary angiography and left ventriculography. Catheter exchanges were performed over an exchange length guidewire. There were no immediate procedural complications. A TR band was used for radial hemostasis at the completion of the procedure.  The patient was transferred to the post catheterization recovery area for further monitoring.  Procedural Findings: Hemodynamics: AO 155/70 with a mean of 109 mmHg LV 153/18 mmHg  Coronary angiography: Coronary dominance: right  Left mainstem: Short without significant disease.  Left anterior descending (LAD): Segmental 80-90% disease in the proximal vessel involving the first septal perforator and first diagonal. There is then a 90% focal stenosis in the mid LAD. The first diagonal has a 60-70% stenosis proximally.  Left circumflex (LCx): 80% segmental disease proximally.  Right coronary artery (RCA): Dominant vessel with a 95% stenosis in the mid vessel. There is TIMI grade 2 flow distally. There is a 60-70% stenosis in the first posterior lateral branch.  Left ventriculography: Left ventricular systolic function demonstrates inferior wall hypokinesis, LVEF is well preserved at 55%, there is no significant mitral regurgitation   Final Conclusions:     1. Severe 3 vessel obstructive atherosclerotic coronary disease. The culprit lesion appears to be in the mid right coronary but he has complex three-vessel disease. 2. Well-preserved left ventricular systolic function with inferior hypokinesis. Recommendations: Recommend surgical evaluation for coronary bypass surgery.  Lynae Pederson [facility] 06/13/2011, 6:00 PM

## 2011-06-13 NOTE — Progress Notes (Signed)
06/13/2011  Pt c/o CP 5/10. Increase Ntg gtt bp 162/102. Increased o2 [place] to 4L.  At 2304 pain 6/10 gave 1 SL Ntg BP 143/91.  At 2309 pt CP 4/10 increased Ntg gtt and gave 2nd SL Ntg.  BP 146/78 Pain decreased to 1/10.  Obtained EKg  ST elevation noted Md Hochrein called aware and no new orders at this time.   Obtained 2nd Ekg improved some.  At 2329 still not pain free 1/10 increased Ntg Gtt to 30 pt became pain free.VS stable will continue to monitor.  Emilie [place] Liter

## 2011-06-13 NOTE — Progress Notes (Signed)
  Echocardiogram 2D Echocardiogram has been performed.  Cathie Beams Deneen 06/13/2011, 12:41 PM

## 2011-06-13 NOTE — Progress Notes (Signed)
Pt used bsc to have bm. After returning to bed after bsc, bp 187/85. 1 minute later at 0809 pt complained of chest pain level 7/10. 1 nitro sl administered, after 5 minutes pain level 2/10 and bp 122/67. After 2 more minutes chest pain free. ekg on the way to perform am ekg.

## 2011-06-13 NOTE — Progress Notes (Signed)
  Patient Name: Leon Morris      SUBJECTIVE: Recurrent chest pain with minimal movement Past Medical History  Diagnosis Date  . Anxiety   . Hypercholesterolemia   . Arthritis   . Shortness of breath     PHYSICAL EXAM Filed Vitals:   06/13/11 0500 06/13/11 0805 06/13/11 0806 06/13/11 0817  BP: 108/51  187/85 122/67  Pulse: 66  63   Temp:  97.6 F (36.4 C)    TempSrc:  Oral    Resp: 16  20   Height:      Weight: 146 lb 6.2 oz (66.4 kg)     SpO2: 97%  96%      Well developed and nourished in no acute distress HENT normal Neck supple with JVP-flat Carotids brisk and full without bruits Clear Regular rate and rhythm, no murmurs or gallops Abd-soft with active BS without hepatomegaly No Clubbing cyanosis edema Skin-warm and dry A & Oriented  Grossly normal sensory and motor function    TELEMETRY: Reviewed telemetry pt in nsr : ECGs demonstrate dynamic T-wave inversions and a hint of elevation in the inferior leads   Intake/Output Summary (Last 24 hours) at 06/13/11 0939 Last data filed at 06/13/11 0800  Gross per 24 hour  Intake  374.9 ml  Output    825 ml  Net -450.1 ml    LABS: Basic Metabolic Panel:  Lab 06/12/11 1413 06/12/11 0550  NA 138 139  K 4.0 3.9  CL 102 102  CO2 26 --  GLUCOSE 116* 102*  BUN 21 25*  CREATININE 1.29 1.40*  CALCIUM 9.3 --  MG -- --  PHOS -- --   Cardiac Enzymes:  Basename 06/12/11 1756 06/12/11 1131  CKTOTAL 92 94  CKMB 5.2* 6.1*  CKMBINDEX -- --  TROPONINI 0.52* 0.60*   CBC:  Lab 06/12/11 1413 06/12/11 0550 06/12/11 0517  WBC 11.6* -- 12.1*  NEUTROABS 8.2* -- 8.7*  HGB 13.3 13.3 12.6*  HCT 39.0 39.0 38.3*  MCV 92.9 -- 93.6  PLT 271 -- 268   PROTIME:  Basename 06/12/11 1413 06/12/11 0517  LABPROT 14.1 13.5  INR 1.07 1.01   Liver Function Tests:  Basename 06/12/11 1413  AST 22  ALT 11  ALKPHOS 89  BILITOT 0.7  PROT 6.9  ALBUMIN 3.5       ASSESSMENT AND PLAN:   The patient presented  with new onset exertional chest discomfort associated with positive enzymes. His electrocardiogram was normal. He is on heparin and aspirin and  Ticagelor.  will add intravenous nitroglycerin   I have spoken with Dr. Jordan. If he has recurrent pain following the addition of IV nitroglycerin, go to the Cath Lab today. Heart rate +/- blood pressure preclude further up titration of his beta blockers and a limits the utility of IV nitroglycerin.  I have reviewed this with the patient.   Signed, Bilal Manzer MD  06/13/2011   

## 2011-06-13 NOTE — Progress Notes (Signed)
06/13/11 0305 At 0228 Pt C/O Left CP  to the Left arm 7/10 pain. Short of breath. Increased O2NC to 4l and 1 Sl NTG given BP 183/99.  0230 BP 174/108 5/10 CP. 0233 2nd SL NTG given BP 129/70.  0240 pain free BP 112/55. EKG obtained NSR with 1st degree AV block noted.  Pt resting will continue to monitor. Md notified aware with no new orders at this time.Emilie [place] Liter

## 2011-06-13 NOTE — Progress Notes (Signed)
   I was called to see the patient late this afternoon in light of recurrent unstable angina symptoms. He has been managed with antiplatelet therapy, heparin, intravenous nitroglycerin. Serial ECGs reviewed showing dynamic ST segment changes, mainly in the inferior leads, suggestive of perhaps an RCA injury current, although not diagnostic for STEMI. Dr. Graciela Husbands was also informed of this, and as a result the patient is being taken to the heart catheterization lab by Dr. Swaziland today. I also spoke with the patient's son and his wife present. Patient had been apprised of the risks and benefits of the procedure. He is in agreement to proceed.   Jonelle Sidle, M.D., F.A.C.C.

## 2011-06-13 NOTE — H&P (View-Only) (Signed)
  Patient Name: Leon Morris [facility]      SUBJECTIVE: Recurrent chest pain with minimal movement Past Medical History  Diagnosis Date  . Anxiety   . Hypercholesterolemia   . Arthritis   . Shortness of breath     PHYSICAL EXAM Filed Vitals:   06/13/11 0500 06/13/11 0805 06/13/11 0806 06/13/11 0817  BP: 108/51  187/85 122/67  Pulse: 66  63   Temp:  97.6 F (36.4 C)    TempSrc:  Oral    Resp: 16  20   Height:      Weight: 146 lb 6.2 oz (66.4 kg)     SpO2: 97%  96%      Well developed and nourished in no acute distress HENT normal Neck supple with JVP-flat Carotids brisk and full without bruits Clear Regular rate and rhythm, no murmurs or gallops Abd-soft with active BS without hepatomegaly No Clubbing cyanosis edema Skin-warm and dry A & Oriented  Grossly normal sensory and motor function    TELEMETRY: Reviewed telemetry pt in nsr : ECGs demonstrate dynamic T-wave inversions and a hint of elevation in the inferior leads   Intake/Output Summary (Last 24 hours) at 06/13/11 0939 Last data filed at 06/13/11 0800  Gross per 24 hour  Intake  374.9 ml  Output    825 ml  Net -450.1 ml    LABS: Basic Metabolic Panel:  Lab 06/12/11 4540 06/12/11 0550  NA 138 139  K 4.0 3.9  CL 102 102  CO2 26 --  GLUCOSE 116* 102*  BUN 21 25*  CREATININE 1.29 1.40*  CALCIUM 9.3 --  MG -- --  PHOS -- --   Cardiac Enzymes:  Basename 06/12/11 1756 06/12/11 1131  CKTOTAL 92 94  CKMB 5.2* 6.1*  CKMBINDEX -- --  TROPONINI 0.52* 0.60*   CBC:  Lab 06/12/11 1413 06/12/11 0550 06/12/11 0517  WBC 11.6* -- 12.1*  NEUTROABS 8.2* -- 8.7*  HGB 13.3 13.3 12.6*  HCT 39.0 39.0 38.3*  MCV 92.9 -- 93.6  PLT 271 -- 268   PROTIME:  Basename 06/12/11 1413 06/12/11 0517  LABPROT 14.1 13.5  INR 1.07 1.01   Liver Function Tests:  Basename 06/12/11 1413  AST 22  ALT 11  ALKPHOS 89  BILITOT 0.7  PROT 6.9  ALBUMIN 3.5       ASSESSMENT AND PLAN:   The patient presented  with new onset exertional chest discomfort associated with positive enzymes. His electrocardiogram was normal. He is on heparin and aspirin and  Ticagelor.  will add intravenous nitroglycerin   I have spoken with Dr. Swaziland. If he has recurrent pain following the addition of IV nitroglycerin, go to the Cath Lab today. Heart rate +/- blood pressure preclude further up titration of his beta blockers and a limits the utility of IV nitroglycerin.  I have reviewed this with the patient.   Signed, Sherryl Manges MD  06/13/2011

## 2011-06-13 NOTE — Progress Notes (Signed)
ANTICOAGULATION CONSULT NOTE - Follow Up Consult  Pharmacy Consult for Heparin Indication: chest pain/ACS  Assessment: 88 yom on heparin for cp/acs. Heparin level 0.64. Slight drop in H/H/Plts no bleeding reported. Noted plans for potential cath today if pt still complaining of chest pain after IV nitro drip started.   Goal of Therapy:  Heparin level 0.3-0.7 units/ml   Plan:  1. Continue heparin at 1200 units/hr (no change) 2. F/u AM Heparin level and CBC  Thank you,  Brett Fairy, PharmD Pager: [phone]  06/13/2011 1:43 PM   No Known Allergies  Patient Measurements: Height: 5\' 3"  (160 cm) Weight: 146 lb 6.2 oz (66.4 kg) IBW/kg (Calculated) : 56.9   Vital Signs: Temp: 97.9 F (36.6 C) (01/06 1130) Temp src: Oral (01/06 1130) BP: 114/62 mmHg (01/06 1015) Pulse Rate: 53  (01/06 1015)  Labs:  Basename 06/13/11 1042 06/13/11 0906 06/13/11 0500 06/12/11 2238 06/12/11 1756 06/12/11 1413 06/12/11 1400 06/12/11 1131 06/12/11 0550 06/12/11 0517  HGB -- 12.1* -- -- -- 13.3 -- -- -- --  HCT -- 36.2* -- -- -- 39.0 -- -- 39.0 --  PLT -- 246 -- -- -- 271 -- -- -- 268  APTT -- -- -- -- -- 101* -- -- -- 30  LABPROT -- -- -- -- -- 14.1 -- -- -- 13.5  INR -- -- -- -- -- 1.07 -- -- -- 1.01  HEPARINUNFRC -- -- 0.64 0.27* -- -- 0.51 -- -- --  CREATININE -- -- -- -- -- 1.29 -- -- 1.40* --  CKTOTAL 101 -- -- -- 92 -- -- 94 -- --  CKMB 5.4* -- -- -- 5.2* -- -- 6.1* -- --  TROPONINI 0.47* -- -- -- 0.52* -- -- 0.60* -- --   Estimated Creatinine Clearance: 33.1 ml/min (by C-G formula based on Cr of 1.29).  Medications:  Scheduled:    . alum & mag hydroxide-simeth  30 mL Oral Once  . aspirin  324 mg Oral NOW   Or  . aspirin  300 mg Rectal NOW  . aspirin EC  81 mg Oral Daily  . citalopram  10 mg Oral Daily  . heparin      . metoprolol tartrate  25 mg Oral BID  . rosuvastatin  20 mg Oral Daily  . sodium chloride  3 mL Intravenous Q12H  . sodium chloride  3 mL Intravenous Q12H  .  Ticagrelor  180 mg Oral To Major   Followed by  . Ticagrelor  90 mg Oral BID   Infusions:    . sodium chloride    . heparin 1,200 Units/hr (06/13/11 0028)  . nitroGLYCERIN 6 mcg/min (06/13/11 1000)  . DISCONTD: sodium chloride 125 mL/hr at 06/12/11 0547   PRN: sodium chloride, acetaminophen, nitroGLYCERIN, nitroGLYCERIN, ondansetron (ZOFRAN) IV, sodium chloride, sodium chloride, zolpidem   Bufford Buttner, Yurianna Tusing N 06/13/2011,1:37 PM

## 2011-06-13 NOTE — Progress Notes (Signed)
Cath revealed severe 3vCAD, necessitating surgical evaluation for CABG.  To resume heparin 8h after sheath pulled per MD orders.  Will resume heparin at previously therapeutic rate of 1200 units/hr.  Check heparin level 8h after drip is resumed. Check daily heparin level and CBC.  Thanks,  Marchelle Folks. Illene Bolus, PharmD, BCPS Clinical Pharmacist Pager: [phone] 06/13/2011 6:58 PM

## 2011-06-13 NOTE — Interval H&P Note (Signed)
History and Physical Interval Note:  06/13/2011 5:08 PM  Leon Morris [facility]  has presented today for surgery, with the diagnosis of Non-stemi. He has continuous chest pain at rest despite optimal medical therapy.  The various methods of treatment have been discussed with the patient and family. After consideration of risks, benefits and other options for treatment, the patient has consented to  Procedure(s): LEFT HEART CATHETERIZATION WITH CORONARY ANGIOGRAM as a surgical intervention .  The patients' history has been reviewed, patient examined, no change in status, stable for surgery.  I have reviewed the patients' chart and labs.  Questions were answered to the patient's satisfaction.     Theron Arista [facility]

## 2011-06-13 NOTE — Progress Notes (Signed)
Pt taken to cath lab

## 2011-06-14 ENCOUNTER — Encounter (HOSPITAL_COMMUNITY)
Admission: EM | Disposition: A | Discharge status: Home / Self Care | Payer: Self-pay | Source: Ambulatory Visit | Attending: Cardiology

## 2011-06-14 ENCOUNTER — Encounter (HOSPITAL_COMMUNITY): Payer: Medicare Other

## 2011-06-14 ENCOUNTER — Other Ambulatory Visit: Payer: Self-pay

## 2011-06-14 DIAGNOSIS — I2510 Atherosclerotic heart disease of native coronary artery without angina pectoris: Secondary | ICD-10-CM

## 2011-06-14 DIAGNOSIS — Z01810 Encounter for preprocedural cardiovascular examination: Secondary | ICD-10-CM

## 2011-06-14 LAB — CARDIAC PANEL(CRET KIN+CKTOT+MB+TROPI)
CK, MB: 14.2 ng/mL (ref 0.3–4.0)
Total CK: 128 U/L (ref 7–232)
Troponin I: 1.2 ng/mL (ref <0.30)

## 2011-06-14 LAB — BASIC METABOLIC PANEL
BUN: 20 mg/dL (ref 6–23)
BUN: 18 mg/dL (ref 6–23)
CO2: 29 mEq/L (ref 19–32)
Calcium: 8.3 mg/dL — ABNORMAL LOW (ref 8.4–10.5)
Calcium: 8.2 mg/dL — ABNORMAL LOW (ref 8.4–10.5)
Creatinine, Ser: 1.56 mg/dL — ABNORMAL HIGH (ref 0.50–1.35)
GFR calc non Af Amer: 39 mL/min — ABNORMAL LOW (ref >90)
GFR calc non Af Amer: 44 mL/min — ABNORMAL LOW (ref >90)
Glucose, Bld: 109 mg/dL — ABNORMAL HIGH (ref 70–99)
Glucose, Bld: 122 mg/dL — ABNORMAL HIGH (ref 70–99)
Sodium: 140 mEq/L (ref 135–145)
Sodium: 137 mEq/L (ref 135–145)

## 2011-06-14 LAB — POCT ACTIVATED CLOTTING TIME: Activated Clotting Time: 165 seconds

## 2011-06-14 SURGERY — PERCUTANEOUS CORONARY STENT INTERVENTION (PCI-S)
Anesthesia: LOCAL

## 2011-06-14 MED ORDER — SODIUM CHLORIDE 0.9 % IV SOLN
INTRAVENOUS | Status: DC
  Administered 2011-06-14: 19:00:00 via INTRAVENOUS

## 2011-06-14 MED ORDER — HEPARIN (PORCINE) IN NACL 2-0.9 UNIT/ML-% IJ SOLN
INTRAMUSCULAR | Status: AC
  Filled 2011-06-14: qty 2000

## 2011-06-14 MED ORDER — NITROGLYCERIN 0.2 MG/ML ON CALL CATH LAB
INTRAVENOUS | Status: AC
  Filled 2011-06-14: qty 1

## 2011-06-14 MED ORDER — HYDRALAZINE HCL 20 MG/ML IJ SOLN
10.0000 mg | INTRAMUSCULAR | Status: DC | PRN
  Filled 2011-06-14: qty 1

## 2011-06-14 MED ORDER — ACETAMINOPHEN 325 MG PO TABS: 650.0000 mg | ORAL_TABLET | ORAL | Status: DC | PRN

## 2011-06-14 MED ORDER — MORPHINE SULFATE 2 MG/ML IJ SOLN
INTRAMUSCULAR | Status: AC
  Administered 2011-06-14: 2 mg via INTRAVENOUS
  Filled 2011-06-14: qty 1

## 2011-06-14 MED ORDER — BIVALIRUDIN 250 MG IV SOLR
INTRAVENOUS | Status: AC
  Filled 2011-06-14: qty 250

## 2011-06-14 MED ORDER — CEFAZOLIN SODIUM 1-5 GM-% IV SOLN
INTRAVENOUS | Status: AC
  Filled 2011-06-14: qty 50

## 2011-06-14 MED ORDER — LIDOCAINE HCL (PF) 1 % IJ SOLN
INTRAMUSCULAR | Status: AC
  Filled 2011-06-14: qty 30

## 2011-06-14 MED ORDER — SODIUM CHLORIDE 0.9 % IJ SOLN
3.0000 mL | Freq: Two times a day (BID) | INTRAMUSCULAR | Status: DC
  Administered 2011-06-14: 3 mL via INTRAVENOUS

## 2011-06-14 MED ORDER — MIDAZOLAM HCL 2 MG/2ML IJ SOLN
INTRAMUSCULAR | Status: AC
  Filled 2011-06-14: qty 2

## 2011-06-14 MED ORDER — MORPHINE SULFATE 2 MG/ML IJ SOLN: 2.0000 mg | INTRAMUSCULAR | Status: DC | PRN

## 2011-06-14 MED ORDER — ROSUVASTATIN CALCIUM 40 MG PO TABS
40.0000 mg | ORAL_TABLET | Freq: Every day | ORAL | Status: DC
  Administered 2011-06-15: 40 mg via ORAL
  Filled 2011-06-14: qty 1

## 2011-06-14 MED ORDER — SODIUM CHLORIDE 0.9 % IV SOLN
INTRAVENOUS | Status: DC
  Administered 2011-06-14: 10 mL via INTRAVENOUS

## 2011-06-14 MED ORDER — SODIUM CHLORIDE 0.9 % IV SOLN: 250.0000 mL | INTRAVENOUS | Status: DC | PRN

## 2011-06-14 MED ORDER — TICAGRELOR 90 MG PO TABS
90.0000 mg | ORAL_TABLET | Freq: Two times a day (BID) | ORAL | Status: DC
  Administered 2011-06-14 – 2011-06-15 (×2): 90 mg via ORAL
  Filled 2011-06-14 (×3): qty 1

## 2011-06-14 MED ORDER — ONDANSETRON HCL 4 MG/2ML IJ SOLN: 4.0000 mg | Freq: Four times a day (QID) | INTRAMUSCULAR | Status: DC | PRN

## 2011-06-14 MED ORDER — FENTANYL CITRATE 0.05 MG/ML IJ SOLN
INTRAMUSCULAR | Status: AC
  Filled 2011-06-14: qty 2

## 2011-06-14 MED ORDER — ATROPINE SULFATE 1 MG/ML IJ SOLN
INTRAMUSCULAR | Status: AC
  Filled 2011-06-14: qty 1

## 2011-06-14 MED ORDER — SODIUM CHLORIDE 0.9 % IJ SOLN: 3.0000 mL | INTRAMUSCULAR | Status: DC | PRN

## 2011-06-14 MED ORDER — TICAGRELOR 90 MG PO TABS
ORAL_TABLET | ORAL | Status: AC
  Filled 2011-06-14: qty 2

## 2011-06-14 NOTE — Progress Notes (Signed)
Nurse called regarding pt c/o increased chest pain rated at a 10/10. EKG drawn revealed precordial ST depressions, inferolateral ST elevations. He underwent cath on 01/06 revealing severe 3-vessel obstructive disease with the culprit lesion suspected to be the mid-RCA; preserved LVEF 55%. Loading dose of Brilinta was given 01/05, maintenance dose given 01/06. He was titrated to NTG IV and received Morphine 4mg  inj with mild relief to 8/10. An additional Morphine 2 mg inj was given. Chest pain reduced to 4/10 with BP in 140s systolic. Pt states BP runs in the 90s/50s at home. Pain rechecked 10 minutes later, decreased to 0/10. Will order repeat EKG. Spoke with Dr. Tenny Craw who subsequently spoke to [facility] regarding CABG scheduling. The limiting factor is Brilinta. Threshold for balloon pump is being discussed currently. Will continue to monitor.  Seen by Dr. Tenny Craw and Dr. Clifton James. Discussed case with TCTS. Will proceed to cath today for attempted PCI of LAD, RCA lesions. Pre-cath orders entered. Pt agrees and is currently being consented for the procedure.   Jacqulyn Bath, PA-C 06/14/2011 3:46 PM   I have seen this patient with Hurman Horn, PA-C. He is having recurrent chest pain. He has critical lesions in the RCA and LAD. Diagnostic cath per Dr. Swaziland on 06/12/10. He was referred for CABG but this has been delayed since he was loaded with Brilinta and his surgical bleeding risk is too high. He has been seen today by Dr. Laneta Simmers and this was confirmed. I believe that urgent PCI is necessary if CABG cannot be performed today. To cath lab for PCI of RCA and possibly the LAD. He is high risk for PCI. He understands this and agrees to proceed. Family present at bedside.   [name] 4:53 PM 06/14/2011

## 2011-06-14 NOTE — Progress Notes (Addendum)
VASCULAR LAB PRELIMINARY  PRELIMINARY  PRELIMINARY  PRELIMINARY  Pre-op Cardiac Surgery  Carotid Findings:   Bilateral:  No ICA stenosis.  Vertebrals antegrade.   Upper Extremity Right Left  Brachial Pressures 110 Not taken due to IV placement  Radial Waveforms Tri Tri  Ulnar Waveforms Tri Tri  Palmar Arch (Allen's Test) Waveform remains normal with radial and ulnar compression. Waveform remains normal with radial compression and decreases >50% with ulnar compression.   Findings:      Lower  Extremity Right Left  Dorsalis Pedis Bilateral palpable pulses.    Anterior Tibial    Posterior Tibial    Ankle/Brachial Indices      Findings:   Bilateral palpable pulses.    Thereasa Parkin, RVT Farrel Demark, [place] 06/14/2011 1:50 PM

## 2011-06-14 NOTE — H&P (View-Only) (Signed)
  Patient Name: Leon Morris      SUBJECTIVE: Patient had several episodes of recurrent chest pain during the night. Lasting 15-25 minutes. Moved to ICU bed to titrate NTG drip. Now pain free.  Past Medical History  Diagnosis Date  . Anxiety   . Hypercholesterolemia   . Arthritis   . Shortness of breath     PHYSICAL EXAM Filed Vitals:   06/14/11 0400 06/14/11 0500 06/14/11 0600 06/14/11 0700  BP: 119/52 102/54 102/54 111/69  Pulse: 55 53 50 61  Temp: 98.6 F (37 C)     TempSrc: Oral     Resp: 18 16 16 19  Height:      Weight:   69.2 kg (152 lb 8.9 oz)   SpO2: 100% 100% 99% 100%     Well developed and nourished in no acute distress HENT normal Neck supple with JVP-flat Carotids brisk and full without bruits Lungs: Clear CV: Regular rate and rhythm, no murmurs or gallops Abd-soft with active BS without hepatomegaly No Clubbing cyanosis edema, right wrist without hematoma Skin-warm and dry A & Oriented  Grossly normal sensory and motor function    TELEMETRY: Reviewed telemetry sinus bradycardia. : ECGs demonstrate sinus brady. Q waves in III and avl. Slight ST elevation in these leads.   Intake/Output Summary (Last 24 hours) at 06/14/11 0836 Last data filed at 06/14/11 0600  Gross per 24 hour  Intake 1182.85 ml  Output   1200 ml  Net -17.15 ml    LABS: Basic Metabolic Panel:  Lab 06/12/11 1413 06/12/11 0550  NA 138 139  K 4.0 3.9  CL 102 102  CO2 26 --  GLUCOSE 116* 102*  BUN 21 25*  CREATININE 1.29 1.40*  CALCIUM 9.3 --  MG -- --  PHOS -- --   Cardiac Enzymes:  Basename 06/13/11 1042 06/12/11 1756 06/12/11 1131  CKTOTAL 101 92 94  CKMB 5.4* 5.2* 6.1*  CKMBINDEX -- -- --  TROPONINI 0.47* 0.52* 0.60*   CBC:  Lab 06/13/11 0906 06/12/11 1413 06/12/11 0550 06/12/11 0517  WBC 11.5* 11.6* -- 12.1*  NEUTROABS -- 8.2* -- 8.7*  HGB 12.1* 13.3 13.3 12.6*  HCT 36.2* 39.0 39.0 38.3*  MCV 93.8 92.9 -- 93.6  PLT 246 271 -- 268    PROTIME:  Basename 06/12/11 1413 06/12/11 0517  LABPROT 14.1 13.5  INR 1.07 1.01   Liver Function Tests:  Basename 06/12/11 1413  AST 22  ALT 11  ALKPHOS 89  BILITOT 0.7  PROT 6.9  ALBUMIN 3.5       ASSESSMENT AND PLAN: 1. NSTEMI with multiple recurrent episodes of chest pain despite optimal medical Rx. On IV heparin and Ntg. Well beta blocked. Ticagrelor held for surgery.  Awaiting CV surgery consult. Patient has severe 3 vessel CAD by cath. LV function is good. 2. Hyperlipidemia on statin.     Signed, Peter Jordan MD, FACC 06/14/2011   

## 2011-06-14 NOTE — Interval H&P Note (Signed)
History and Physical Interval Note:  06/14/2011 4:48 PM  The patient is here today for PCI of the RCA and possibly the LAD. He is having ongoing angina with critical stenoses in the RCA and the LAD. He is not a candidate for CABG for the next 5 days because he was preloaded with Brilinta and the bleeding risk is too high with CABG. He has been seen by Dr. Laneta Simmers today and this has been confirmed.  I have personally reviewed the labs and examined the patient. The History and Physical has been reviewed and there are no changes. The risks and benefits of the procedure have been reviewed with the patient. Informed consent has been signed by the patient and is present in the chart. The patient agrees to proceed with the procedure.      MCALHANY,CHRISTOPHER

## 2011-06-14 NOTE — Progress Notes (Signed)
06/14/2011 Pt C/O CP 6/10 Nausea and diaphoretic BP 160/86 increased NTG gtt to 35 no relief gave 1 Sl Ntg.  At 0115 BP 153/83 2/10 CP.  Increased NTG to 40. Md Paged to bedside.  0119 BP 130/76 pain free and nausea gone. Still gave 4 zofran.  Md at bedside will move to ICU.  Will Continue to monitor. Emilie [place] Liter

## 2011-06-14 NOTE — Progress Notes (Signed)
06/14/2011 0150 Pt moved to ICU and family called. VS stable pt remains pain free. Report given to RN Janee Morn, Shirlee Limerick

## 2011-06-14 NOTE — Consult Note (Signed)
[address]            [place] [address]          [phone]       Leon Morris [facility] Medical Record #[id number] Date of Birth: Dec 26, 1922  Referring physician:  Peter Swaziland, MD   Chief Complaint:    Chief Complaint  Patient presents with  . Chest Pain    History of Present Illness:      The patient is an 52 her old gentleman with a history of hyperlipidemia who is quite active going to the gym 5 days per week and lifting weights and riding a stationary bicycle. He said he never had any problem at the gym until last Thursday when he developed substernal chest pressure and fatigue after about 15 minutes of exercise. This lasted about 20 minutes and resolves spontaneously. He never had these symptoms before. Then on Friday he went back to the gym and after about 3 minutes he developed similar symptoms and stopped. At the time of presentation he was chest pain free and had no ischemic EKG changes. Yesterday he developed recurrent chest pain and was taken the catheterization lab by Dr. Swaziland which showed severe three-vessel coronary disease with the culprit appearing to be a very tight mid right coronary stenosis. The patient had received a loading dose of Brilinta as well as one additional dose. The patient was returned to the CCU in continued on heparin and nitroglycerin. He was free of chest pain most of the day and then this afternoon had 10 out of 10 chest pain.   Current Activity/ Functional Status: Patient is independent with mobility/ambulation, transfers, ADL's, IADL's.   Past Medical History  Diagnosis Date  . Anxiety   . Hypercholesterolemia   . Arthritis   . Shortness of breath     Past Surgical History  Procedure Date  . Knee surgery   . Shoulder surgery   . Hernia repair   . Joint replacement     History  Smoking status  . Former Smoker  . Quit date: 05/18/1941  Smokeless tobacco  . Not on file      History  Alcohol Use No    History   Social History  . Marital Status: Widowed    Spouse Name: N/A    Number of Children: N/A  . Years of Education: N/A   Occupational History  . Not on file.   Social History Main Topics  . Smoking status: Former Smoker    Quit date: 05/18/1941  . Smokeless tobacco: Not on file  . Alcohol Use: No  . Drug Use: No  . Sexually Active: No   Other Topics Concern  . Not on file   Social History Narrative  . No narrative on file    No Known Allergies  Current Facility-Administered Medications  Medication Dose Route Frequency Provider Last Rate Last Dose  . 0.9 %  sodium chloride infusion  1 mL/kg/hr Intravenous Continuous Peter Swaziland, MD 66.4 mL/hr at 06/13/11 2032 1 mL/kg/hr at 06/13/11 2032  . 0.9 %  sodium chloride infusion   Intravenous Continuous Rollene Rotunda, MD 10 mL/hr at 06/14/11 0127 10 mL at 06/14/11 0127  . acetaminophen (TYLENOL) tablet 650 mg  650 mg Oral Q4H PRN Juliet Rude. Pickering, MD      . aspirin EC tablet 81 mg  81  mg Oral Daily Juliet Rude. Pickering, MD   81 mg at 06/14/11 1116  . bivalirudin (ANGIOMAX) 250 MG injection           . citalopram (CELEXA) tablet 10 mg  10 mg Oral Daily Juliet Rude. Pickering, MD   10 mg at 06/14/11 1003  . fentaNYL (SUBLIMAZE) 0.05 MG/ML injection           . heparin 1000 UNIT/ML injection           . heparin 2-0.9 UNIT/ML-% infusion           . heparin 2-0.9 UNIT/ML-% infusion           . heparin ADULT infusion 100 units/ml (25000 units/250 ml)  1,350 Units/hr Intravenous Continuous Judie Bonus Hammons, PHARMD 13.5 mL/hr at 06/14/11 1229 1,350 Units/hr at 06/14/11 1229  . lidocaine (XYLOCAINE) 1 % injection           . lidocaine (XYLOCAINE) 1 % injection           . LORazepam (ATIVAN) tablet 0.5 mg  0.5 mg Oral Q6H PRN Peter Swaziland, MD      . metoprolol tartrate (LOPRESSOR) tablet 25 mg  25 mg Oral BID Juliet Rude. Pickering, MD   25 mg at 06/14/11 1003  . midazolam (VERSED) 2 MG/2ML  injection           . morphine 10 MG/ML injection           . morphine 2 MG/ML injection 2 mg  2 mg Intravenous Q4H PRN Odella Aquas      . morphine 2 MG/ML injection        2 mg at 06/14/11 1520  . morphine 4 MG/ML injection 4 mg  4 mg Intravenous Q1H PRN Peter Swaziland, MD   4 mg at 06/14/11 1439  . nitroGLYCERIN (NITROSTAT) SL tablet 0.4 mg  0.4 mg Sublingual Q5 min PRN Luis Abed, MD   0.4 mg at 06/14/11 0114  . nitroGLYCERIN (NTG ON-CALL) 0.2 mg/mL injection           . nitroGLYCERIN (NTG ON-CALL) 0.2 mg/mL injection           . nitroGLYCERIN 0.2 mg/mL in dextrose 5 % infusion  2-200 mcg/min Intravenous Titrated Duke Salvia, MD 36 mL/hr at 06/14/11 1600 120 mcg/min at 06/14/11 1600  . ondansetron (ZOFRAN) injection 4 mg  4 mg Intravenous Q6H PRN Juliet Rude. Pickering, MD   4 mg at 06/14/11 0125  . rosuvastatin (CRESTOR) tablet 20 mg  20 mg Oral Daily Juliet Rude. Pickering, MD   20 mg at 06/14/11 1116  . sodium chloride 0.9 % injection 3 mL  3 mL Intravenous Q12H Nathan R. Pickering, MD   3 mL at 06/14/11 1005  . sodium chloride 0.9 % injection 3 mL  3 mL Intravenous PRN Juliet Rude. Pickering, MD      . sodium chloride 0.9 % injection 3 mL  3 mL Intravenous Q12H Duke Salvia, MD   3 mL at 06/13/11 2130  . sodium chloride 0.9 % injection 3 mL  3 mL Intravenous PRN Duke Salvia, MD      . verapamil (ISOPTIN) 2.5 MG/ML injection           . zolpidem (AMBIEN) tablet 5 mg  5 mg Oral QHS PRN Laurann Montana, PA   5 mg at 06/12/11 2214  . DISCONTD: 0.9 %  sodium chloride infusion  250 mL Intravenous PRN Duke Salvia,  MD   250 mL at 06/13/11 1408  . DISCONTD: 0.9 %  sodium chloride infusion  1 mL/kg/hr Intravenous Continuous Luis Abed, MD      . DISCONTD: aspirin chewable tablet 324 mg  324 mg Oral NOW Juliet Rude. Pickering, MD      . DISCONTD: aspirin suppository 300 mg  300 mg Rectal NOW Juliet Rude. Pickering, MD      . DISCONTD: heparin ADULT infusion 100 units/mL (25000 units/250 mL)  1,200  Units/hr Intravenous Continuous Janice Coffin, RPH 12 mL/hr at 06/13/11 0028 1,200 Units/hr at 06/13/11 0028  . DISCONTD: nitroGLYCERIN (NITROSTAT) SL tablet 0.4 mg  0.4 mg Sublingual Q5 min PRN Juliet Rude. Pickering, MD   0.4 mg at 06/13/11 0454  . DISCONTD: Ticagrelor (BRILINTA) tablet 90 mg  90 mg Oral BID Duke Salvia, MD   90 mg at 06/13/11 6295     History reviewed. No pertinent family history.   Review of Systems:     Cardiac Review of Systems: Y or N  Chest Pain [  y  ]  Resting SOB [n   ] Exertional SOB  [ y ]  Pollyann Kennedy Milo.Brash ]   Pedal Edema Milo.Brash  ]    Palpitations [ n ] Syncope  [n  ]  Presyncope [n ]   General Review of Systems: [Y] = yes [  ]=no  Constitional: recent weight change [n]; anorexia [ n ]; fatigue [ n ]; nausea [n ]; night sweats [n  ]; fever [n  ]; or chills [ n ];                                                                                                                                          Dental: poor dentition[n  ];    Eye : blurred vision [n ]; diplopia Milo.Brash ]; vision changes [ n ];  Amaurosis fugax[ n];  Resp: cough [n  ];  wheezing[ n ];  hemoptysis[ n ]; shortness of breath[ n ]; paroxysmal nocturnal dyspnea[ n ]; dyspnea on exertion[ y]; or orthopnea[ n ];   GI:  gallstones[ n], vomiting[ n ];  dysphagia[n]; melena[ n ];  hematochezia [n ]; heartburn[ n ];   Hx of  Colonoscopy[  n];  GU: kidney stones [n  ]; hematuria[n  ];   dysuria [n ];  nocturia[  n];  history of     obstruction [n  ];              Skin: rash, swelling[ n ];, hair loss[n];  peripheral edema[n];  or itching[ n ];  Musculosketetal: myalgias[ n ];  joint swelling[ n ];  joint erythema[ n ];  joint pain[ y ] s/p left knee replacement last year;  back pain[ n ];   Heme/Lymph: bruising[ n ];  bleeding[ n;  anemia[ n ];   Neuro: TIA[  n ];  headaches[  n];  stroke[  n];  vertigo[ n ];  seizures[ n ];   paresthesias[  n];  difficulty walking[n  ];   Psych:depression[ n];  anxiety[ n ];   Endocrine: diabetes[  n  thyroid dysfunction[ n ]   Immunizations: Flu [ n ]; Pneumococcal[ n ];   Other:  Physical Exam: BP 110/42  Pulse 57  Temp(Src) 97.9 F (36.6 C) (Oral)  Resp 14  Ht 5\' 3"  (1.6 m)  Wt 69.2 kg (152 lb 8.9 oz)  BMI 27.02 kg/m2  SpO2 98%  General appearance: alert and cooperative HEENT: Normocephalic and atraumatic. Pupils are equal reactive to light and accommodation. Extraocular muscles are intact. Oropharynx is clear. Neck: Carotid pulses are palpable bilaterally. There no bruits. There is no adenopathy or thyromegaly. Neurologic: intact Heart: regular rate and rhythm, S1, S2 normal, no murmur, click, rub or gallop Lungs: clear to auscultation bilaterally Abdomen: soft, non-tender; bowel sounds normal; no masses,  no organomegaly Extremities: swelling around left knee with scar from TKR   Diagnostic Studies & Laboratory data:    Procedural Findings: Hemodynamics: AO 155/70 with a mean of 109 mmHg LV 153/18 mmHg  Coronary angiography: Coronary dominance: right  Left mainstem: Short without significant disease.  Left anterior descending (LAD): Segmental 80-90% disease in the proximal vessel involving the first septal perforator and first diagonal. There is then a 90% focal stenosis in the mid LAD. The first diagonal has a 60-70% stenosis proximally.  Left circumflex (LCx): 80% segmental disease proximally.  Right coronary artery (RCA): Dominant vessel with a 95% stenosis in the mid vessel. There is TIMI grade 2 flow distally. There is a 60-70% stenosis in the first posterior lateral branch.  Left ventriculography: Left ventricular systolic function demonstrates inferior wall hypokinesis, LVEF is well preserved at 55%, there is no significant mitral regurgitation   Final Conclusions:   1. Severe 3 vessel obstructive atherosclerotic coronary disease. The culprit lesion appears to be in the mid right coronary but he has complex  three-vessel disease. 2. Well-preserved left ventricular systolic function with inferior hypokinesis. Recommendations: Recommend surgical evaluation for coronary bypass surgery.    Recent Radiology Findings:   No results found.    Recent Lab Findings: Lab Results  Component Value Date   WBC 11.5* 06/13/2011   HGB 12.1* 06/13/2011   HCT 36.2* 06/13/2011   PLT 246 06/13/2011   GLUCOSE 109* 06/14/2011   CHOL 130 06/13/2011   TRIG 84 06/13/2011   HDL 41 06/13/2011   LDLCALC 72 06/13/2011   ALT 11 06/12/2011   AST 22 06/12/2011   NA 140 06/14/2011   K 4.1 06/14/2011   CL 104 06/14/2011   CREATININE 1.56* 06/14/2011   BUN 20 06/14/2011   CO2 29 06/14/2011   TSH 2.446 06/12/2011   INR 1.07 06/12/2011   HGBA1C 6.0* 06/12/2011      Assessment / Plan:      The patient has severe three-vessel coronary disease with the culprit appearing to be a 95-99% mid right coronary stenosis with TIMI 2 flow distally. He also a significant high-grade LAD stenosis and 80% left circumflex stenosis. He had been very active until this past Thursday without any symptoms at all and I suspect he has had a ruptured plaque in his mid right coronary artery. While coronary bypass surgery may be indicated for three-vessel coronary disease in this patient, he has received a loading dose and 1 further dose of Brilinta. He would be at  high risk for severe hemorrhage with coronary bypass surgery at this time and ideally we should wait at least 5 days before performing surgery. He continues to have chest pain and EKG changes. I discussed the case with Dr. Clifton James and he feels that the right coronary artery and LAD are both amenable to PCI. Therefore I would agree that acute PCI of the right coronary lesion would be the best treatment for this patient using a drug-eluting stent, followed by stenting of the LAD. This will be performed by Dr. Clifton James this afternoon.      @[email or web address] @ 06/14/2011 4:55 PM

## 2011-06-14 NOTE — Progress Notes (Signed)
Orthopedic Tech Progress Note Patient Details:  Leon Morris Leon Morris Jul 13, 1922 [id number]  Other Ortho Devices Type of Ortho Device: Knee Immobilizer Ortho Device Location: right leg Ortho Device Interventions: Application   Nikki Dom 06/14/2011, 6:45 PM

## 2011-06-14 NOTE — Procedures (Signed)
Cardiac Catheterization Operative Report  Leon Morris [facility] [id number] 1/7/20136:03 PM No primary provider on file.  Procedure Performed:  1. PTCA/DES x 1 mid RCA 2. PTCA/DES x 2 mid LAD, PTCA/DES x 1 proximal LAD  Operator: Verne Carrow, MD  Indication: Admitted with unstable angina, NSTEMI and found to have severe CAD by cath yesterday (cath per Dr. Swaziland). CT surgery consulted for CABG but pt had been loaded with Brilinta so surgery plans delayed for at least 5 days. Pt had recurrent pain today with dynamic EKG changes. Discussed case with Dr. Laneta Simmers. Elected to perform PCI.                                     Procedure Details: The risks, benefits, complications, treatment options, and expected outcomes were discussed with the patient. The patient and/or family concurred with the proposed plan, giving informed consent. The patient was brought to the cath lab after IV hydration was begun and oral premedication was given. The patient was further sedated with Versed. The right groin was prepped and draped in the usual manner. Using the modified Seldinger access technique, a 6 French sheath was placed in the right femoral artery. The patient was given a bolus of Angiomax and a drip was started. He had been loaded previously with Brilinta. I engaged the RCA with a 6Fr JR4 guiding catheter. When the ACT was >200, a Cougar IC wire was advanced down the RCA. A 2.5 x 12 mm balloon was used to predilated the lesion. A 3.5 x 20mm Promus Element DES was deployed in the mid vessel. A 3.75 x 15 mm [place] balloon was used to post-dilated the stent. The stenosis was taken from 99% down to 0%.   I then turned my attention toward the LAD lesions. A XB LAD 3.5 guiding catheter was used to engage the left main artery. A Cougar IC wire was advanced down the LAD. A 2.5 x 12 mm balloon was used to pre-dilate the mid stenosis. A 2.5 x 12 mm Promus Element DES was deployed in the mid stenosis. A 2.75 x 8 mm [place]  balloon was inflated twice in the stent. This balloon was used to measure the length of the proximal stenosis. A 3.5 x 20 mm Promus Element DES was deployed in the proximal vessel. A 3.75 x 15 [place] balloon was used to post-dilate the stent. After this, the patient began to have 10/10 chest pain and follow up imaging showed occlusion of the mid stent at the distal edge, likely from an edge tear with dissection. I passed the 2.5 x 12 mm balloon back down the vessel and inflated over the distal stent edge without restoration of flow. A then passed a 2.5 x 20 mm Promus Element DES into the mid vessel and deployed at the distal edge of the mid stent extending into the distal vessel. Flow was re-established. The mid stenosis was taken from 90% down to 0%. The proximal stenosis was taken from 80% down to 0%.   At the conclusion of the case, he was pain free.  The patient was taken to the recovery area in stable condition.     Hemodynamic Findings: Central aortic pressure: 109/54  Impression: 1. Successful PTCA/DES x 1 mid RCA 2. Successful PTCA/DES x 2 mid LAD, DES x 1 proximal LAD  Recommendations: ASA and Brilinta for at least one year.  Complications:  LAD dissection post stenting. Resolved with further stenting.

## 2011-06-14 NOTE — Progress Notes (Signed)
  Patient Name: Leon Morris [facility]      SUBJECTIVE: Patient had several episodes of recurrent chest pain during the night. Lasting 15-25 minutes. Moved to ICU bed to titrate NTG drip. Now pain free.  Past Medical History  Diagnosis Date  . Anxiety   . Hypercholesterolemia   . Arthritis   . Shortness of breath     PHYSICAL EXAM Filed Vitals:   06/14/11 0400 06/14/11 0500 06/14/11 0600 06/14/11 0700  BP: 119/52 102/54 102/54 111/69  Pulse: 55 53 50 61  Temp: 98.6 F (37 C)     TempSrc: Oral     Resp: 18 16 16 19   Height:      Weight:   69.2 kg (152 lb 8.9 oz)   SpO2: 100% 100% 99% 100%     Well developed and nourished in no acute distress HENT normal Neck supple with JVP-flat Carotids brisk and full without bruits Lungs: Clear CV: Regular rate and rhythm, no murmurs or gallops Abd-soft with active BS without hepatomegaly No Clubbing cyanosis edema, right wrist without hematoma Skin-warm and dry A & Oriented  Grossly normal sensory and motor function    TELEMETRY: Reviewed telemetry sinus bradycardia. : ECGs demonstrate sinus brady. Q waves in III and avl. Slight ST elevation in these leads.   Intake/Output Summary (Last 24 hours) at 06/14/11 0836 Last data filed at 06/14/11 0600  Gross per 24 hour  Intake 1182.85 ml  Output   1200 ml  Net -17.15 ml    LABS: Basic Metabolic Panel:  Lab 06/12/11 2130 06/12/11 0550  NA 138 139  K 4.0 3.9  CL 102 102  CO2 26 --  GLUCOSE 116* 102*  BUN 21 25*  CREATININE 1.29 1.40*  CALCIUM 9.3 --  MG -- --  PHOS -- --   Cardiac Enzymes:  Basename 06/13/11 1042 06/12/11 1756 06/12/11 1131  CKTOTAL 101 92 94  CKMB 5.4* 5.2* 6.1*  CKMBINDEX -- -- --  TROPONINI 0.47* 0.52* 0.60*   CBC:  Lab 06/13/11 0906 06/12/11 1413 06/12/11 0550 06/12/11 0517  WBC 11.5* 11.6* -- 12.1*  NEUTROABS -- 8.2* -- 8.7*  HGB 12.1* 13.3 13.3 12.6*  HCT 36.2* 39.0 39.0 38.3*  MCV 93.8 92.9 -- 93.6  PLT 246 271 -- 268    PROTIME:  Basename 06/12/11 1413 06/12/11 0517  LABPROT 14.1 13.5  INR 1.07 1.01   Liver Function Tests:  Basename 06/12/11 1413  AST 22  ALT 11  ALKPHOS 89  BILITOT 0.7  PROT 6.9  ALBUMIN 3.5       ASSESSMENT AND PLAN: 1. NSTEMI with multiple recurrent episodes of chest pain despite optimal medical Rx. On IV heparin and Ntg. Well beta blocked. Ticagrelor held for surgery.  Awaiting CV surgery consult. Patient has severe 3 vessel CAD by cath. LV function is good. 2. Hyperlipidemia on statin.     Signed, Joanathan Affeldt Swaziland MD, [facility] 06/14/2011

## 2011-06-14 NOTE — Progress Notes (Signed)
ANTICOAGULATION CONSULT NOTE - Follow Up Consult  Pharmacy Consult for Heparin Indication: CAD pending CABG eval  No Known Allergies  Patient Measurements: Height: 5\' 3"  (160 cm) Weight: 152 lb 8.9 oz (69.2 kg) IBW/kg (Calculated) : 56.9  Heparin Dosing Weight: 69.2 kg  Vital Signs: Temp: 97.7 F (36.5 C) (01/07 1224) Temp src: Oral (01/07 1224) BP: 104/32 mmHg (01/07 1100) Pulse Rate: 52  (01/07 1200)  Labs:  Basename 06/14/11 0939 06/14/11 0938 06/13/11 1042 06/13/11 0906 06/13/11 0500 06/12/11 2238 06/12/11 1756 06/12/11 1413 06/12/11 1131 06/12/11 0550 06/12/11 0517  HGB -- -- -- 12.1* -- -- -- 13.3 -- -- --  HCT -- -- -- 36.2* -- -- -- 39.0 -- 39.0 --  PLT -- -- -- 246 -- -- -- 271 -- -- 268  APTT -- -- -- -- -- -- -- 101* -- -- 30  LABPROT -- -- -- -- -- -- -- 14.1 -- -- 13.5  INR -- -- -- -- -- -- -- 1.07 -- -- 1.01  HEPARINUNFRC 0.20* -- -- -- 0.64 0.27* -- -- -- -- --  CREATININE -- 1.56* -- -- -- -- -- 1.29 -- 1.40* --  CKTOTAL -- -- 101 -- -- -- 92 -- 94 -- --  CKMB -- -- 5.4* -- -- -- 5.2* -- 6.1* -- --  TROPONINI -- -- 0.47* -- -- -- 0.52* -- 0.60* -- --   Estimated Creatinine Clearance: 29.7 ml/min (by C-G formula based on Cr of 1.56).   Medications:  Infusions:    . sodium chloride 1 mL/kg/hr (06/13/11 2032)  . sodium chloride 10 mL (06/14/11 0127)  . heparin 1,200 Units/hr (06/14/11 0308)  . nitroGLYCERIN 40 mcg/min (06/14/11 0118)  . DISCONTD: sodium chloride    . DISCONTD: heparin 1,200 Units/hr (06/13/11 0028)    Assessment: 78 yo M restarted on heparin after cath for 3 vessel disease, pending CABG eval.  Heparin level < goal.  Will increase infusion conservatively since pt s/p cath and with advanced age and poor renal function.  Goal of Therapy:  Heparin level 0.3-0.7 units/ml   Plan:  Increase heparin to 1350 units/hr. Recheck heparin level in 6 hours. Continue daily heparin level and CBC. Follow up CABG consult  recommendations.  [employer], Pharm.D., BCPS Clinical Pharmacist Pager [phone]   06/14/2011,12:30 PM

## 2011-06-15 ENCOUNTER — Encounter (HOSPITAL_COMMUNITY): Payer: Medicare Other

## 2011-06-15 DIAGNOSIS — I214 Non-ST elevation (NSTEMI) myocardial infarction: Principal | ICD-10-CM

## 2011-06-15 LAB — URINALYSIS, ROUTINE W REFLEX MICROSCOPIC
Bilirubin Urine: NEGATIVE
Leukocytes, UA: NEGATIVE
Nitrite: NEGATIVE
Specific Gravity, Urine: 1.016 (ref 1.005–1.030)
Urobilinogen, UA: 0.2 mg/dL (ref 0.0–1.0)
pH: 6 (ref 5.0–8.0)

## 2011-06-15 LAB — BASIC METABOLIC PANEL
Calcium: 8.3 mg/dL — ABNORMAL LOW (ref 8.4–10.5)
GFR calc Af Amer: 50 mL/min — ABNORMAL LOW (ref >90)
GFR calc non Af Amer: 43 mL/min — ABNORMAL LOW (ref >90)
Potassium: 3.5 mEq/L (ref 3.5–5.1)
Sodium: 138 mEq/L (ref 135–145)

## 2011-06-15 LAB — CBC
HCT: 31.9 % — ABNORMAL LOW (ref 39.0–52.0)
MCHC: 33.2 g/dL (ref 30.0–36.0)
RDW: 13.7 % (ref 11.5–15.5)

## 2011-06-15 MED ORDER — BIOTENE DRY MOUTH MT LIQD: 15.0000 mL | OROMUCOSAL | Status: DC | PRN

## 2011-06-15 MED ORDER — POTASSIUM CHLORIDE CRYS ER 20 MEQ PO TBCR
40.0000 meq | EXTENDED_RELEASE_TABLET | Freq: Once | ORAL | Status: AC
  Administered 2011-06-15: 40 meq via ORAL
  Filled 2011-06-15: qty 2

## 2011-06-15 MED FILL — Dextrose Inj 5%: INTRAVENOUS | Qty: 50 | Status: AC

## 2011-06-15 NOTE — Progress Notes (Signed)
Cardiology Progress Note Patient Name: Leon Morris [facility] Date of Encounter: 06/15/2011, 9:07 AM     Subjective  No overnight events. Patient resting comfortably this morning without complaints of shortness of breath or chest pain.   Objective   Telemetry: Sinus Rhythm w/ 1st degree AV Block, 50s-60s  Medications: . aspirin EC  81 mg Oral Daily  . citalopram  10 mg Oral Daily  . metoprolol tartrate  25 mg Oral BID  . nitroGLYCERIN   Intravenous Continuous  . rosuvastatin  40 mg Oral q1800  . Ticagrelor  90 mg Oral BID   Physical Exam: Temp:  [97.5 F (36.4 C)-98.3 F (36.8 C)] 97.5 F (36.4 C) (01/08 0821) Pulse Rate:  [52-68] 57  (01/08 0700) Resp:  [13-22] 16  (01/08 0700) BP: (97-156)/(31-80) 138/56 mmHg (01/08 0700) SpO2:  [91 %-100 %] 100 % (01/08 0700) Weight:  [152 lb 8.9 oz (69.2 kg)] 152 lb 8.9 oz (69.2 kg) (01/08 0500)  General: Elderly white male, in no acute distress. Head: Normocephalic, atraumatic, sclera non-icteric, nares are without discharge.  Neck: Supple. Negative for carotid bruits or JVD Lungs: Clear bilaterally to auscultation without wheezes, rales, or rhonchi. Breathing is unlabored. Heart: RRR S1 S2 without murmurs, rubs, or gallops.  Abdomen: Soft, non-tender, non-distended with normoactive bowel sounds. No rebound/guarding. No obvious abdominal masses. Msk:  Strength and tone appear normal for age. Extremities: No edema. Right groin without bruit. Right wrist with bruising, no bruit. No clubbing or cyanosis. Distal pedal pulses are 2+ and equal bilaterally. Neuro: Alert and oriented X 3. Moves all extremities spontaneously. Psych:  Responds to questions appropriately with a normal affect.  Intake/Output Summary (Last 24 hours) at 06/15/11 0907 Last data filed at 06/15/11 0842  Gross per 24 hour  Intake 750.03 ml  Output   1120 ml  Net -369.97 ml   Labs:  [facility] 06/15/11 0519 06/14/11 2042  NA 138 137  K 3.5 3.8  CL 104 104  CO2  26 24  GLUCOSE 124* 122*  BUN 17 18  CREATININE 1.42* 1.40*  CALCIUM 8.3* 8.2*   Basename 06/12/11 1413  AST 22  ALT 11  ALKPHOS 89  BILITOT 0.7  PROT 6.9  ALBUMIN 3.5   Basename 06/15/11 0519 06/13/11 0906 06/12/11 1413  WBC 14.9* 11.5* --  NEUTROABS -- -- 8.2*  HGB 10.6* 12.1* --  HCT 31.9* 36.2* --  MCV 94.4 93.8 --  PLT 229 246 --   Basename 06/14/11 1627 06/13/11 1042 06/12/11 1756 06/12/11 1131  CKTOTAL 128 101 92 94  CKMB 14.2* 5.4* 5.2* 6.1*  TROPONINI 1.20* 0.47* 0.52* 0.60*   Basename 06/12/11 1413  HGBA1C 6.0*   Basename 06/13/11 0500  CHOL 130  HDL 41  LDLCALC 72  TRIG 84  CHOLHDL 3.2   Basename 06/12/11 1413  TSH 2.446   Radiology/Studies:   06/13/11 - 2D Echocardiogram Study Conclusions - Left ventricle: Technically limited study. I feel the overall EF is normal. I cannot fully assess all walls. But I do not see an obvious wall abnormality. The cavity size was normal. Wall thickness was increased in a pattern of mild LVH. - Right atrium: The atrium was mildly dilated.  06/13/11 - Left Heart Cath, Selective Coronary Angiography, LV angiography Hemodynamics:  AO 155/70 with a mean of 109 mmHg  LV 153/18 mmHg  Coronary dominance: right  Left mainstem: Short without significant disease.  Left anterior descending (LAD): Segmental 80-90% disease in the  proximal vessel involving the first septal perforator and first diagonal. There is then a 90% focal stenosis in the mid LAD. The first diagonal has a 60-70% stenosis proximally.  Left circumflex (LCx): 80% segmental disease proximally.  Right coronary artery (RCA): Dominant vessel with a 95% stenosis in the mid vessel. There is TIMI grade 2 flow distally. There is a 60-70% stenosis in the first posterior lateral branch.  Left ventriculography: Left ventricular systolic function demonstrates inferior wall hypokinesis, LVEF is well preserved at 55%, there is no significant mitral regurgitation  Final  Conclusions:  1. Severe 3 vessel obstructive atherosclerotic coronary disease. The culprit lesion appears to be in the mid right coronary but he has complex three-vessel disease.  2. Well-preserved left ventricular systolic function with inferior hypokinesis.  Recommendations: Recommend surgical evaluation for coronary bypass surgery.  06/14/11 - Cardiac Catheterization w/ PCI Impression:  1. Successful PTCA/DES x 1 mid RCA  2. Successful PTCA/DES x 2 mid LAD, DES x 1 proximal LAD  Recommendations: ASA and Brilinta for at least one year.  Complications: LAD dissection post stenting. Resolved with further stenting.   Dg Chest Port 1 View 06/12/2011  Findings: Heart size upper normal limits to mildly enlarged.  Mild eventration of the right hemi diaphragm.  Linear lung base opacities are mild.  Otherwise, no focal areas of consolidation. Limited osseous evaluation by portable technique demonstrates no definite acute osseous abnormality. Right AC joint DJD.  IMPRESSION: Heart size upper normal limits to mildly enlarged.  Minimal linear lung base opacities, favored to represent scarring or atelectasis.      Assessment and Plan  78 y.o. male w/ PMHx significant for HLD and HTN who presented to [facility] on 06/12/11 with complaints of chest pain. He ruled in for NSTEMI and underwent cardiac catheterization.  1. NSTEMI: Cardiac cath 06/13/11 revealed severe 3V disease and he was consulted by TCTS with plans for CABG, however, this had to be delayed since he was loaded with Brilinta. He had recurrent severe chest pain with EKG changes on 06/14/11 and underwent subsequent cardiac cath with PTCA/DES x1 to mid RCA and PTCA/DES x2 to mid LAD and x1 to proximal LAD. He has remained chest pain free post-procedure with resolution of ST elevations on EKG. Continue ASA, Brilinta, BB, and statin. Discontinue IV NTG and monitor for chest pain.  2. Hyperlipidemia: LDL 72. Continue Statin  3. Anemia: H&H 10.6/31.9  this am. Right groin and wrist site without hematoma. Denies abdominal pain or bloody stools. Cont to monitor. CBC in AM.  4. Leukocytosis: WBC 14.9 this am. Patient afebrile, lungs CTA. Check urinalysis. Cont to monitor.  Signed, HOPE, JESSICA PA-C  Patient seen and examined and history reviewed. Agree with above findings and plan. Patient is doing well without angina post complex PCI and abrupt closure of LAD. ECG looks good with Q waves in III and Avf but no ST changes. Will transfer to telemetry and ambulate. Possible DC tomorrow if Hgb and creatnine stable.  Thedora Hinders, [facility] 06/15/2011 11:41 AM

## 2011-06-15 NOTE — Progress Notes (Signed)
Ed completed with pt. Not interested in CRPII due to his own ex regimen. Not to lift weights until approved by cardiologist. Told pt he can walk with increasing duration for 2 weeks and then begin bike in 2 weeks with no resistance, increasing duration from there. (Unable to walk long distances due to knee).  [id number]  Ethelda Chick CES, ACSM

## 2011-06-15 NOTE — Progress Notes (Signed)
CARDIAC REHAB PHASE I   PRE:  Rate/Rhythm: 59SB  BP:  Supine: 132/63  Sitting:   Standing:    SaO2: 100%RA  MODE:  Ambulation: 350 ft   POST:  Rate/Rhythem: 91SR  BP:  Supine:   Sitting: 127/68  Standing:    SaO2: 100%RA 1105-1138 Pt happy to walk. Very active before admission. Walked 350 ft with asst x 2 with steady gait. Denied CP. Did c/o slight lightheadedness upon standing but better with activity. Tolerated well. Can be asst x 1 next walk. To recliner with call bell after walk.  Leon Morris

## 2011-06-15 NOTE — Progress Notes (Signed)
Pt's creatinine clearance <30. ACT done per protocol was 165. Right groin sheath pulled out at 2100 and pressure applied for  thirty-six minutes.Right groin site level 0.Pt tolerated procedure well.However pt non-compliant with bedrest prior to and after sheath removal.Educated several times during these times.Reinforced education as needed.

## 2011-06-15 NOTE — Progress Notes (Signed)
Received order post PCI after complicated procedure. Will plan to ambulate pt today unless otherwise specified. Thx. Ethelda Chick CES, ACSM

## 2011-06-16 ENCOUNTER — Encounter (HOSPITAL_COMMUNITY)
Admission: EM | Disposition: A | Discharge status: Home / Self Care | Payer: Self-pay | Source: Ambulatory Visit | Attending: Cardiology

## 2011-06-16 ENCOUNTER — Encounter (HOSPITAL_COMMUNITY): Payer: Self-pay | Admitting: Cardiology

## 2011-06-16 DIAGNOSIS — E785 Hyperlipidemia, unspecified: Secondary | ICD-10-CM

## 2011-06-16 DIAGNOSIS — I10 Essential (primary) hypertension: Secondary | ICD-10-CM

## 2011-06-16 DIAGNOSIS — I2510 Atherosclerotic heart disease of native coronary artery without angina pectoris: Secondary | ICD-10-CM

## 2011-06-16 HISTORY — DX: Essential (primary) hypertension: I10

## 2011-06-16 LAB — BASIC METABOLIC PANEL
Chloride: 103 mEq/L (ref 96–112)
GFR calc Af Amer: 46 mL/min — ABNORMAL LOW (ref >90)
GFR calc non Af Amer: 40 mL/min — ABNORMAL LOW (ref >90)
Potassium: 3.8 mEq/L (ref 3.5–5.1)
Sodium: 139 mEq/L (ref 135–145)

## 2011-06-16 LAB — CBC
HCT: 35.5 % — ABNORMAL LOW (ref 39.0–52.0)
Hemoglobin: 11.7 g/dL — ABNORMAL LOW (ref 13.0–17.0)
RBC: 3.77 MIL/uL — ABNORMAL LOW (ref 4.22–5.81)
WBC: 11.5 10*3/uL — ABNORMAL HIGH (ref 4.0–10.5)

## 2011-06-16 SURGERY — CORONARY ARTERY BYPASS GRAFTING (CABG)
Anesthesia: General | Site: Chest

## 2011-06-16 MED ORDER — ROSUVASTATIN CALCIUM 20 MG PO TABS: 20.0000 mg | ORAL_TABLET | Freq: Every day | ORAL | Status: DC

## 2011-06-16 MED ORDER — ROSUVASTATIN CALCIUM 20 MG PO TABS
20.0000 mg | ORAL_TABLET | Freq: Every day | ORAL | Status: DC
  Filled 2011-06-16: qty 1

## 2011-06-16 MED ORDER — TICAGRELOR 90 MG PO TABS
90.0000 mg | ORAL_TABLET | Freq: Two times a day (BID) | ORAL | Status: DC
Start: 2011-06-16 — End: 2011-06-16
  Administered 2011-06-16: 90 mg via ORAL
  Filled 2011-06-16 (×2): qty 1

## 2011-06-16 MED ORDER — METOPROLOL TARTRATE 25 MG PO TABS
37.5000 mg | ORAL_TABLET | Freq: Two times a day (BID) | ORAL | Status: DC
  Administered 2011-06-16: 37.5 mg via ORAL
  Filled 2011-06-16 (×2): qty 1

## 2011-06-16 MED ORDER — METOPROLOL TARTRATE 25 MG PO TABS: 37.5000 mg | ORAL_TABLET | Freq: Two times a day (BID) | ORAL | Status: DC

## 2011-06-16 MED ORDER — TICAGRELOR 90 MG PO TABS: 90.0000 mg | ORAL_TABLET | Freq: Two times a day (BID) | ORAL | Status: DC

## 2011-06-16 MED ORDER — NITROGLYCERIN 0.4 MG SL SUBL: 0.4000 mg | SUBLINGUAL_TABLET | SUBLINGUAL | Status: DC | PRN

## 2011-06-16 MED ORDER — ASPIRIN 81 MG PO TBEC: 81.0000 mg | DELAYED_RELEASE_TABLET | Freq: Every day | ORAL | Status: DC

## 2011-06-16 NOTE — Discharge Summary (Signed)
Discharge Summary   Patient ID: Leon Morris MRN: [id number], DOB/AGE: 76/31/1924 76 y.o.  Primary MD: No primary provider on file. Primary Cardiologist: Peter Swaziland MD  Admit date: 06/12/2011 D/C date:     06/16/2011      Primary Discharge Diagnoses:  1. NSTEMI/CAD  - s/p cardiac catheterization on 06/13/11 which revealed severe 3 vessel obstructive atherosclerotic coronary disease  - CABG recommended, however, he received Brilinta and plans for operating were delayed  - Recurrence of CP and EKG changes resulted in repeat catheterization on 06/14/11 with successful PTCA/DES x1 mid RCA, PTCA/DES x 2 mid LAD, and DES x1 proximal LAD  - Recommendations for DAPT with ASA and Brilinta for at least one year.   - Discharged on 81mg  ASA, Brilinta, Metoprolol 37.5mg  bid, Crestor 20mg , SL nitro  Secondary Discharge Diagnoses:  1. Hyperlipidemia - LDL 72, Discharged on Crestor 20mg  2. Hypertension  Allergies No Known Allergies  Diagnostic Studies/Procedures:  06/13/11 - 2D Echocardiogram  Study Conclusions  - Left ventricle: Technically limited study. I feel the overall EF is normal. I cannot fully assess all walls. But I do not see an obvious wall abnormality. The cavity size was normal. Wall thickness was increased in a pattern of mild LVH.  - Right atrium: The atrium was mildly dilated.   06/13/11 - Left Heart Cath, Selective Coronary Angiography, LV angiography  Hemodynamics:  AO 155/70 with a mean of 109 mmHg  LV 153/18 mmHg  Coronary dominance: right  Left mainstem: Short without significant disease.  Left anterior descending (LAD): Segmental 80-90% disease in the proximal vessel involving the first septal perforator and first diagonal. There is then a 90% focal stenosis in the mid LAD. The first diagonal has a 60-70% stenosis proximally.  Left circumflex (LCx): 80% segmental disease proximally.  Right coronary artery (RCA): Dominant vessel with a 95% stenosis in the mid vessel.  There is TIMI grade 2 flow distally. There is a 60-70% stenosis in the first posterior lateral branch.  Left ventriculography: Left ventricular systolic function demonstrates inferior wall hypokinesis, LVEF is well preserved at 55%, there is no significant mitral regurgitation  Final Conclusions:  1. Severe 3 vessel obstructive atherosclerotic coronary disease. The culprit lesion appears to be in the mid right coronary but he has complex three-vessel disease.  2. Well-preserved left ventricular systolic function with inferior hypokinesis.  Recommendations: Recommend surgical evaluation for coronary bypass surgery.   06/14/11 - Cardiac Catheterization w/ PCI  Impression:  1. Successful PTCA/DES x 1 mid RCA  2. Successful PTCA/DES x 2 mid LAD, DES x 1 proximal LAD  Recommendations: ASA and Brilinta for at least one year.  Complications: LAD dissection post stenting. Resolved with further stenting.   06/14/11 - Bilateral Carotid Doppler - No ICA stenosis. Vertebrals antegrade  History of Present Illness: 78 y.o. male w/ PMHx significant for HLD and HTN who presented to [facility] on 06/12/11 with complaints of chest pain and ruled in for NSTEMI.  He stated that since 1983 he had consistently been able to exercise for 30 to 45 minutes by lifting weights and riding on a stationary bicycle without having any symptoms. Two days prior to presentation, he complained of chest pain, which he described as "something sitting on my chest" that started about 15 minutes after he started to exercise. The chest pain lasted about 20 minutes before spontaneous relief. The day prior to presentation, he noted that he could only exercise for 3 minutes before experiencing similar  chest pain. His chest pain was associated with shortness of breath, and he noted that he had PND and orthopnea for the last 2 days. He denied any radiation, diaphoresis, palpitation or syncope.   Hospital Course: In the ED his EKG was  without evidence of acute ischemia and he was chest pain free. His initial poc troponin was elevated and his CXR revealed heart size upper normal limits to mildly enlarged with minimal linear lung base opacities, favored to represent scarring or atelectasis. He was hypertensive with BP 166/75, but otherwise vital signs were stable. He was initiated on ASA, a heparin drip, beta blocker and changed his statin to Crestor and admitted for further evaluation and treatment.  His cardiac enzymes remained elevated, he had recurrent chest pain, and his EKG showed dynamic T-wave inversions with a hint of elevation in the inferior leads, so Tricagelor was initiated and IV NTG drip was added. A 2D echocardiogram was performed on 06/13/11 revealing an overall normal EF and mild LVH, but was unable to fully assess all walls for abnormalities. He underwent cardiac catheterization on 06/13/11 which revealed severe 3 vessel obstructive atherosclerotic coronary disease with the culprit lesion being the mid RCA. Recommendations were made for a surgical evaluation for CABG. TCTS evaluated the patient and agreed that CABG was indicated, however, because he received Brilinta he would have been at high risk for hemorrhage and the operation would need to be delayed. The patient had recurrent chest pain and EKG changes despite medical management and was transferred to the ICU. After discussion with TCTS, it was decided the patient would best benefit from urgent PCI. He underwent cardiac catheterization on 06/14/11 with successful PTCA/DES x1 mid RCA, PTCA/DES x 2 mid LAD, and DES x1 proximal LAD with recommendations for DAPT with ASA and Brilinta for at least one year. He tolerated the procedure well and was chest pain free post-procedure.  He was able to be transferred out of the ICU and remained free of chest pain. During the transfer process he missed one dose of Brilinta and his statin, but these were restarted and continued upon  discharge. He ambulated with cardiac rehab without complaints of chest pain or shortness of breath. He was stable for discharge to home with plans to follow up with Dr. Swaziland as scheduled below with recommendations to continue with cardiac rehab as an outpatient.   Discharge Vitals: Blood pressure 148/77, pulse 60, temperature 97.8 F (36.6 C), temperature source Oral, resp. rate 18, height 5\' 3"  (1.6 m), weight 146 lb (66.225 kg), SpO2 97.00%.  Labs: Component Value Date   WBC 11.5* 06/16/2011   HGB 11.7* 06/16/2011   HCT 35.5* 06/16/2011   MCV 94.2 06/16/2011   PLT 225 06/16/2011    Lab 06/16/11 0542 06/12/11 1413  NA 139 --  K 3.8 --  CL 103 --  CO2 28 --  BUN 18 --  CREATININE 1.51* --  CALCIUM 8.9 --  PROT -- 6.9  BILITOT -- 0.7  ALKPHOS -- 89  ALT -- 11  AST -- 22  GLUCOSE 94 --   Basename  06/14/11 1627  06/13/11 1042  06/12/11 1756  06/12/11 1131   CKTOTAL  128  101  92  94   CKMB  14.2*  5.4*  5.2*  6.1*   TROPONINI  1.20*  0.47*  0.52*  0.60*    Basename  06/12/11 1413   HGBA1C  6.0*    Basename  06/13/11 0500   CHOL  130  HDL  41   LDLCALC  72   TRIG  84   CHOLHDL  3.2    Basename  06/12/11 1413   TSH  2.446     Discharge Medications   Start Taking These Medications   aspirin EC 81 MG EC tablet Take 1 tablet (81 mg total) by mouth daily.   metoprolol tartrate (LOPRESSOR) 25 MG tablet Take 1.5 tablets (37.5 mg total) by mouth 2 (two) times daily.   nitroGLYCERIN (NITROSTAT) 0.4 MG SL tablet Place 1 tablet (0.4 mg total) under the tongue every 5 (five) minutes as needed for chest pain.   rosuvastatin (CRESTOR) 20 MG tablet Take 1 tablet (20 mg total) by mouth daily at 6 PM.   Ticagrelor (BRILINTA) 90 MG TABS tablet Take 1 tablet (90 mg total) by mouth 2 (two) times daily.    Continue Taking These Medications   citalopram (CELEXA) 20 MG tablet Take 30 mg by mouth daily.       STOP taking these medications          simvastatin 20 MG tablet     Disposition   Discharge Orders    Future Appointments: Provider: Department: Dept Phone: Center:   07/02/2011 3:30 PM Peter Swaziland, MD [facility] [phone] None        Outstanding Labs/Studies: None  Duration of Discharge Encounter: Greater than 30 minutes including physician and PA time.  Signed, Shekelia Boutin PA-C 06/16/2011, 9:56 AM

## 2011-06-16 NOTE — Progress Notes (Signed)
Cardiology Progress Note Patient Name: Leon Morris [facility] Date of Encounter: 06/16/2011, 8:03 AM     Subjective  No overnight events. Patient resting comfortably this morning without complaints of shortness of breath or chest pain. Ambulated well with cardiac Rehab   Objective   Telemetry: Sinus Rhythm w/ 1st degree AV Block, 60-70s. Single 5 beat run NSVT last night.  Medications: . aspirin EC  81 mg Oral Daily  . citalopram  10 mg Oral Daily  . metoprolol tartrate  25 mg Oral BID  . nitroGLYCERIN   Intravenous Continuous  . rosuvastatin  40 mg Oral q1800  . Ticagrelor  90 mg Oral BID   Physical Exam: Temp:  [97.5 F (36.4 C)-98.4 F (36.9 C)] 97.8 F (36.6 C) (01/09 0531) Pulse Rate:  [57-70] 60  (01/09 0531) Resp:  [17-21] 18  (01/09 0531) BP: (121-148)/(34-77) 148/77 mmHg (01/09 0531) SpO2:  [95 %-99 %] 97 % (01/09 0531) Weight:  [66.225 kg (146 lb)] 66.225 kg (146 lb) (01/09 0531)  General: Elderly white male, in no acute distress. Head: Normocephalic, atraumatic, sclera non-icteric, nares are without discharge.  Neck: Supple. Negative for carotid bruits or JVD Lungs: Clear bilaterally to auscultation without wheezes, rales, or rhonchi. Breathing is unlabored. Heart: RRR S1 S2 without murmurs, rubs, or gallops.  Abdomen: Soft, non-tender, non-distended with normoactive bowel sounds. No rebound/guarding. No obvious abdominal masses. Msk:  Strength and tone appear normal for age. Extremities: No edema. Right groin without bruit. Right wrist with bruising, no bruit. No clubbing or cyanosis. Distal pedal pulses are 2+ and equal bilaterally. Neuro: Alert and oriented X 3. Moves all extremities spontaneously. Psych:  Responds to questions appropriately with a normal affect.  Intake/Output Summary (Last 24 hours) at 06/15/11 0907 Last data filed at 06/15/11 0842  Gross per 24 hour  Intake 750.03 ml  Output   1120 ml  Net -369.97 ml   Labs:  [facility] 06/15/11 0519  06/14/11 2042  NA 138 137  K 3.5 3.8  CL 104 104  CO2 26 24  GLUCOSE 124* 122*  BUN 17 18  CREATININE 1.42* 1.40*  CALCIUM 8.3* 8.2*   Basename 06/12/11 1413  AST 22  ALT 11  ALKPHOS 89  BILITOT 0.7  PROT 6.9  ALBUMIN 3.5   Basename 06/15/11 0519 06/13/11 0906 06/12/11 1413  WBC 14.9* 11.5* --  NEUTROABS -- -- 8.2*  HGB 10.6* 12.1* --  HCT 31.9* 36.2* --  MCV 94.4 93.8 --  PLT 229 246 --   Basename 06/14/11 1627 06/13/11 1042 06/12/11 1756 06/12/11 1131  CKTOTAL 128 101 92 94  CKMB 14.2* 5.4* 5.2* 6.1*  TROPONINI 1.20* 0.47* 0.52* 0.60*   Basename 06/12/11 1413  HGBA1C 6.0*   Basename 06/13/11 0500  CHOL 130  HDL 41  LDLCALC 72  TRIG 84  CHOLHDL 3.2   Basename 06/12/11 1413  TSH 2.446   Radiology/Studies:   06/13/11 - 2D Echocardiogram Study Conclusions - Left ventricle: Technically limited study. I feel the overall EF is normal. I cannot fully assess all walls. But I do not see an obvious wall abnormality. The cavity size was normal. Wall thickness was increased in a pattern of mild LVH. - Right atrium: The atrium was mildly dilated.  06/13/11 - Left Heart Cath, Selective Coronary Angiography, LV angiography Hemodynamics:  AO 155/70 with a mean of 109 mmHg  LV 153/18 mmHg  Coronary dominance: right  Left mainstem: Short without significant disease.  Left  anterior descending (LAD): Segmental 80-90% disease in the proximal vessel involving the first septal perforator and first diagonal. There is then a 90% focal stenosis in the mid LAD. The first diagonal has a 60-70% stenosis proximally.  Left circumflex (LCx): 80% segmental disease proximally.  Right coronary artery (RCA): Dominant vessel with a 95% stenosis in the mid vessel. There is TIMI grade 2 flow distally. There is a 60-70% stenosis in the first posterior lateral branch.  Left ventriculography: Left ventricular systolic function demonstrates inferior wall hypokinesis, LVEF is well preserved at 55%,  there is no significant mitral regurgitation  Final Conclusions:  1. Severe 3 vessel obstructive atherosclerotic coronary disease. The culprit lesion appears to be in the mid right coronary but he has complex three-vessel disease.  2. Well-preserved left ventricular systolic function with inferior hypokinesis.  Recommendations: Recommend surgical evaluation for coronary bypass surgery.  06/14/11 - Cardiac Catheterization w/ PCI Impression:  1. Successful PTCA/DES x 1 mid RCA  2. Successful PTCA/DES x 2 mid LAD, DES x 1 proximal LAD  Recommendations: ASA and Brilinta for at least one year.  Complications: LAD dissection post stenting. Resolved with further stenting.   Dg Chest Port 1 View 06/12/2011  Findings: Heart size upper normal limits to mildly enlarged.  Mild eventration of the right hemi diaphragm.  Linear lung base opacities are mild.  Otherwise, no focal areas of consolidation. Limited osseous evaluation by portable technique demonstrates no definite acute osseous abnormality. Right AC joint DJD.  IMPRESSION: Heart size upper normal limits to mildly enlarged.  Minimal linear lung base opacities, favored to represent scarring or atelectasis.      Assessment and Plan  78 y.o. male w/ PMHx significant for HLD and HTN who presented to [facility] on 06/12/11 with complaints of chest pain. He ruled in for NSTEMI and underwent cardiac catheterization.  1. NSTEMI: Cardiac cath 06/13/11 revealed severe 3V disease and he was consulted by TCTS with plans for CABG, however, this had to be delayed since he was loaded with Brilinta. He had recurrent severe chest pain with EKG changes on 06/14/11 and underwent subsequent cardiac cath with PTCA/DES x1 to mid RCA and PTCA/DES x2 to mid LAD and x1 to proximal LAD. He has remained chest pain free post-procedure with resolution of ST elevations on EKG. Continue ASA, Brilinta, BB, and statin. Patient did not receive Brilinta last night. Also statin not  ordered on transfer.  2. Hyperlipidemia: LDL 72. Continue Crestor 20 mg daily.  3. Anemia: Hgb improved to 11.7. Right groin and wrist site without hematoma.   4. Leukocytosis: WBC down to 11.5 this am. Patient afebrile, lungs CTA. UA negative.   Plan: will DC home today on ASA, brilinta, crestor. Will increase metoprolol to 37.5 mg bid. Plan phase 2 cardiac rehab. Follow up in our office 1-2 weeks.  Thedora Hinders, [facility] 06/16/2011 8:03 AM

## 2011-06-16 NOTE — Progress Notes (Signed)
   CARE MANAGEMENT NOTE 06/16/2011  Patient:  HERVE, HAUG   Account Number:  [id number]  Date Initiated:  06/16/2011  Documentation initiated by:  GRAVES-BIGELOW,Linnea Todisco  Subjective/Objective Assessment:   P s/p LHC. Plan for home on brilinta. Cm called to [facility] and they dont have brilinta in stock could order but will not be in until 06-17-11 after 2pm. Cm will call [facility].     Action/Plan:   CM will provide pt with 30 day free card and co pay card. MD please write rx for 30 day free no refills and then the original rx with refills.   Anticipated DC Date:  06/16/2011   Anticipated DC Plan:  HOME/SELF CARE      DC Planning Services  Medication Assistance      Choice offered to / List presented to:             Status of service:  Completed, signed off Medicare Important Message given?   (If response is "NO", the following Medicare IM given date fields will be blank) Date Medicare IM given:   Date Additional Medicare IM given:    Discharge Disposition:  HOME/SELF CARE  Per UR Regulation:    Comments:  06-16-11 [address] Tomi Bamberger, RN,BSN [phone] Pt is aware of where to pick meds up from. [employer] has brilinta on hold for pt. Thanks

## 2011-06-16 NOTE — Progress Notes (Signed)
NSG 1550 - Pt d/c home with instructions, r/x, and f/u appointments. Pt and dtr verbalized understanding of instructions, home with family, escorted out by [place] NT.  Handouts given on metoprolol and nitro.  Leon Morris 06/16/2011

## 2011-06-16 NOTE — Discharge Summary (Signed)
Patient seen and examined and history reviewed. Agree with above findings and plan. See note from earlier today.  Theron Arista [facility] 06/16/2011 10:51 AM

## 2011-06-16 NOTE — Progress Notes (Signed)
CARDIAC REHAB PHASE I   PRE:  Rate/Rhythm: 59SB  BP:  Supine:   Sitting: 133/91  Standing:    SaO2:   MODE:  Ambulation: 550 ft   POST:  Rate/Rhythem: 65  BP:  Supine:   Sitting: 120/70  Standing:    SaO2: 99%RA 1010-1040 Pt walked 550 ft with handheld asst with steady gait. Tolerated well. No chest pain. Pt gave permission to refer to [facility] 2.  Leon Morris

## 2011-06-21 ENCOUNTER — Telehealth: Payer: Self-pay | Admitting: Cardiology

## 2011-06-21 NOTE — Telephone Encounter (Signed)
New Msg: Pt calling c/o sharp pain in lower part of back following stent implantation, pt wanted to discuss this with nurse. Pt wanted to know if he could take aleve for pain. Pt also wanted to discuss with nurse if he could remove bandage from wrist where CATH was attempted to inserted without success. Please return pt call to discuss further.

## 2011-06-21 NOTE — Telephone Encounter (Signed)
Pt calling back re previous msg, pt having some pain, and requesting a call back asap, pls [phone]

## 2011-06-21 NOTE — Telephone Encounter (Signed)
Patient was called,states he has noticed since his heart cath he is having sharp pain rt lower back only if turns certain ways.Spoke with Dr.Jordan okay to take aleve if needed.Okay to remove bandage from rt.wrist.Advised to call back if needed and keep appointment with Dr.Jordan 07/02/11.

## 2011-06-30 ENCOUNTER — Encounter: Payer: Self-pay | Admitting: Physician Assistant

## 2011-06-30 ENCOUNTER — Ambulatory Visit (HOSPITAL_COMMUNITY)
Admission: RE | Admit: 2011-06-30 | Discharge: 2011-06-30 | Disposition: A | Discharge status: Home / Self Care | Payer: Medicare Other | Source: Ambulatory Visit | Attending: Internal Medicine | Admitting: Internal Medicine

## 2011-06-30 DIAGNOSIS — I721 Aneurysm of artery of upper extremity: Secondary | ICD-10-CM

## 2011-06-30 DIAGNOSIS — I729 Aneurysm of unspecified site: Secondary | ICD-10-CM | POA: Insufficient documentation

## 2011-06-30 DIAGNOSIS — Z48812 Encounter for surgical aftercare following surgery on the circulatory system: Secondary | ICD-10-CM

## 2011-06-30 NOTE — Progress Notes (Signed)
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   Leon Morris, 06/30/2011, 11:00 AM

## 2011-06-30 NOTE — Progress Notes (Signed)
Patient ID: Leon Morris, male   DOB: [birth year], 78 y.o.   MRN: [id number] [facility] [place] ADMIT/CONSULT NOTE 06/30/2011 [id number]  KV:[id number] radial aneurysm Referring Physician:Dr.Osborne  History of Present Illness: Leon Morris [facility] 78 y.o. Male under went right radial artery cathiterization 3 weeks ago and has developed a pulsatile mass on his right wrist.  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  Past Medical History  Diagnosis Date  . Anxiety   . Hypercholesterolemia   . Arthritis   . Shortness of breath   . Hypertension 06/16/2011    Past Surgical History  Procedure Date  . Knee surgery   . Shoulder surgery   . Hernia repair   . Joint replacement      ROS: [x]  Positive  [ ]  Denies    General: [ ]  Weight loss, [ ]  Fever, [ ]  chills Neurologic: [ ]  Dizziness, [ ]  Blackouts, [ ]  Seizure [ ]  Stroke, [ ]  "Mini stroke", [ ]  Slurred speech, [ ]  Temporary blindness; [ ]  weakness in arms or legs, [ ]  Hoarseness Cardiac: [ ]  Chest pain/pressure, [ ]  Shortness of breath at rest [ ]  Shortness of breath with exertion, [ ]  Atrial fibrillation or irregular heartbeat Vascular: [ ]  Pain in legs with walking, [ ]  Pain in legs at rest, [ ]  Pain in legs at night,  [ ]  Non-healing ulcer, [ ]  Blood clot in vein/DVT,   Pulmonary: [ ]  Home oxygen, [ ]  Productive cough, [ ]  Coughing up blood, [ ]  Asthma,  [ ]  Wheezing Musculoskeletal:  [ ]  Arthritis, [ ]  Low back pain, [ ]  Joint pain Hematologic: [ ]  Easy Bruising, [ ]  Anemia; [ ]  Hepatitis Gastrointestinal: [ ]  Blood in stool, [ ]  Gastroesophageal Reflux/heartburn, [ ]  Trouble swallowing Urinary: [ ]  chronic Kidney disease, [ ]  on HD - [ ]  MWF or [ ]  TTHS, [ ]  Burning with urination, [ ]  Difficulty urinating Skin: [ ]  Rashes, [ ]  Wounds Psychological: [ ]  Anxiety, [ ]  Depression  Social History History  Substance Use Topics  . Smoking status: Former Smoker   Quit date: 05/18/1941  . Smokeless tobacco: Not on file  . Alcohol Use: No    Family History No family history on file.  No Known Allergies  Current Outpatient Prescriptions  Medication Sig Dispense Refill  . aspirin EC 81 MG EC tablet Take 1 tablet (81 mg total) by mouth daily.      . citalopram (CELEXA) 20 MG tablet Take 30 mg by mouth daily.        . metoprolol tartrate (LOPRESSOR) 25 MG tablet Take 1.5 tablets (37.5 mg total) by mouth 2 (two) times daily.  120 tablet  6  . nitroGLYCERIN (NITROSTAT) 0.4 MG SL tablet Place 1 tablet (0.4 mg total) under the tongue every 5 (five) minutes as needed for chest pain.  25 tablet  3  . rosuvastatin (CRESTOR) 20 MG tablet Take 1 tablet (20 mg total) by mouth daily at 6 PM.  30 tablet  6  . Ticagrelor (BRILINTA) 90 MG TABS tablet Take 1 tablet (90 mg total) by mouth 2 (two) times daily.  60 tablet  6     Imaging: No results found.  Significant Diagnostic Studies: CBC    Component Value Date/Time   WBC 11.5* 06/16/2011 0542   RBC 3.77* 06/16/2011 0542   HGB 11.7* 06/16/2011 0542   HCT 35.5* 06/16/2011 0542  PLT 225 06/16/2011 0542   MCV 94.2 06/16/2011 0542   MCH 31.0 06/16/2011 0542   MCHC 33.0 06/16/2011 0542   RDW 13.7 06/16/2011 0542   LYMPHSABS 2.5 06/12/2011 1413   MONOABS 0.8 06/12/2011 1413   EOSABS 0.1 06/12/2011 1413   BASOSABS 0.0 06/12/2011 1413    BMET    Component Value Date/Time   NA 139 06/16/2011 0542   K 3.8 06/16/2011 0542   CL 103 06/16/2011 0542   CO2 28 06/16/2011 0542   GLUCOSE 94 06/16/2011 0542   BUN 18 06/16/2011 0542   CREATININE 1.51* 06/16/2011 0542   CALCIUM 8.9 06/16/2011 0542   GFRNONAA 40* 06/16/2011 0542   GFRAA 46* 06/16/2011 0542    COAG Lab Results  Component Value Date   INR 1.07 06/12/2011   INR 1.01 06/12/2011   INR 1.03 03/24/2010   No results found for this basename: PTT     Physical Examination  @IPVITALS @ Pulse Readings from Last 3 Encounters:  06/16/11 60  06/16/11 60  06/16/11 60    General:  WDWN in  NAD Gait: Normal Palp. Radial pseudo aneurysm.  Bil. Upper extremity are N/M/V intact.   He reports no pain with active ROM or to palp.      Non-Invasive Vascular Imaging: Doppler done today shows a radial pseudo aneurysm 1.10cm by 2.26cm in size.   ASSESSMENT: PLAN:  He will f/u as an out patient for exploration of radial pseudo aneurysm and repair.

## 2011-07-01 ENCOUNTER — Encounter (HOSPITAL_COMMUNITY): Payer: Self-pay | Admitting: Vascular Surgery

## 2011-07-01 ENCOUNTER — Encounter (HOSPITAL_COMMUNITY)
Admission: RE | Admit: 2011-07-01 | Discharge: 2011-07-01 | Disposition: A | Discharge status: Home / Self Care | Payer: Medicare Other | Source: Ambulatory Visit | Attending: Vascular Surgery | Admitting: Vascular Surgery

## 2011-07-01 ENCOUNTER — Other Ambulatory Visit: Payer: Self-pay

## 2011-07-01 ENCOUNTER — Ambulatory Visit (HOSPITAL_COMMUNITY)
Admission: RE | Admit: 2011-07-01 | Discharge: 2011-07-01 | Disposition: A | Discharge status: Home / Self Care | Payer: Medicare Other | Source: Ambulatory Visit | Attending: Anesthesiology | Admitting: Anesthesiology

## 2011-07-01 ENCOUNTER — Encounter (HOSPITAL_COMMUNITY): Payer: Self-pay

## 2011-07-01 DIAGNOSIS — Z01818 Encounter for other preprocedural examination: Secondary | ICD-10-CM | POA: Insufficient documentation

## 2011-07-01 DIAGNOSIS — I10 Essential (primary) hypertension: Secondary | ICD-10-CM | POA: Insufficient documentation

## 2011-07-01 DIAGNOSIS — M4120 Other idiopathic scoliosis, site unspecified: Secondary | ICD-10-CM | POA: Insufficient documentation

## 2011-07-01 DIAGNOSIS — Z01812 Encounter for preprocedural laboratory examination: Secondary | ICD-10-CM | POA: Insufficient documentation

## 2011-07-01 LAB — BASIC METABOLIC PANEL
GFR calc non Af Amer: 38 mL/min — ABNORMAL LOW (ref >90)
Glucose, Bld: 92 mg/dL (ref 70–99)
Potassium: 4.7 mEq/L (ref 3.5–5.1)
Sodium: 138 mEq/L (ref 135–145)

## 2011-07-01 LAB — CBC
Hemoglobin: 12 g/dL — ABNORMAL LOW (ref 13.0–17.0)
MCH: 31.6 pg (ref 26.0–34.0)
RBC: 3.8 MIL/uL — ABNORMAL LOW (ref 4.22–5.81)

## 2011-07-01 LAB — PROTIME-INR
INR: 1.11 (ref 0.00–1.49)
Prothrombin Time: 14.5 seconds (ref 11.6–15.2)

## 2011-07-01 NOTE — Pre-Procedure Instructions (Signed)
20 Leon Morris [facility]  07/01/2011   Your procedure is scheduled on:  Monday 07/05/11    Report to [facility] at 530 AM.  Call this number if you have problems the morning of surgery: [phone]   Remember:   Do not eat food:After Midnight.  May have clear liquids: up to 4 Hours before arrival.  Clear liquids include soda, tea, black coffee, apple or grape juice, broth.  Take these medicines the morning of surgery with A SIP OF WATER:  CELEXA, LOPRESSOR(METOPROLOL),1     Do not wear jewelry, make-up or nail polish.  Do not wear lotions, powders, or perfumes. You may wear deodorant.  Do not shave 48 hours prior to surgery.  Do not bring valuables to the hospital.  Contacts, dentures or bridgework may not be worn into surgery.  Leave suitcase in the car. After surgery it may be brought to your room.  For patients admitted to the hospital, checkout time is 11:00 AM the day of discharge.   Patients discharged the day of surgery will not be allowed to drive home.  Name and phone number of your driver:   Special Instructions: CHG Shower Use Special Wash: 1/2 bottle night before surgery and 1/2 bottle morning of surgery.   Please read over the following fact sheets that you were given: Pain Booklet, Coughing and Deep Breathing, MRSA Information and Surgical Site Infection Prevention

## 2011-07-01 NOTE — Progress Notes (Signed)
SPOKE WITH Leon Morris AT DR Bosie Helper OFFICE WHO STATED PATIENT SHOULD NOT STOP BRILINTA. PATIENT INSTRUCTED TO KEEP TAKING .

## 2011-07-01 NOTE — Progress Notes (Signed)
CHART LEFT FOR ALLISON TO REVIEW LABS.

## 2011-07-01 NOTE — Progress Notes (Signed)
SPOKE WITH ALLISON RE PATIENT JUST HAVING MI WITH STENTS PLACED 06/2011.  PATIENT IS TO SEE DR Swaziland 07/02/11 , ALLISON WILL REVIEW CHART.

## 2011-07-02 ENCOUNTER — Ambulatory Visit (INDEPENDENT_AMBULATORY_CARE_PROVIDER_SITE_OTHER): Payer: Medicare Other | Admitting: Cardiology

## 2011-07-02 ENCOUNTER — Encounter (HOSPITAL_COMMUNITY): Payer: Self-pay | Admitting: Pharmacy Technician

## 2011-07-02 ENCOUNTER — Encounter: Payer: Self-pay | Admitting: Cardiology

## 2011-07-02 ENCOUNTER — Encounter: Payer: Medicare Other | Admitting: Cardiology

## 2011-07-02 VITALS — BP 146/72 | HR 54 | Ht 64.0 in | Wt 147.0 lb

## 2011-07-02 DIAGNOSIS — I729 Aneurysm of unspecified site: Secondary | ICD-10-CM

## 2011-07-02 DIAGNOSIS — I2510 Atherosclerotic heart disease of native coronary artery without angina pectoris: Secondary | ICD-10-CM

## 2011-07-02 DIAGNOSIS — I10 Essential (primary) hypertension: Secondary | ICD-10-CM

## 2011-07-02 DIAGNOSIS — E785 Hyperlipidemia, unspecified: Secondary | ICD-10-CM

## 2011-07-02 DIAGNOSIS — I214 Non-ST elevation (NSTEMI) myocardial infarction: Secondary | ICD-10-CM

## 2011-07-02 NOTE — Assessment & Plan Note (Signed)
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 He has been evaluated by vascular surgery and will undergo surgical repair on Monday. I do not anticipate any long-term sequela with this.

## 2011-07-02 NOTE — Assessment & Plan Note (Signed)
He is status post complex stenting of the LAD and right coronary with drug-eluting stents. He has moderate  residual disease in the circumflex. He is asymptomatic at this point. We will continue with dual antiplatelet therapy and risk factor modification. He is an active participant in the cardiac rehabilitation program.

## 2011-07-02 NOTE — Assessment & Plan Note (Signed)
Currently on Crestor. We'll reassess lipid levels on his next followup visit in 6 weeks.

## 2011-07-02 NOTE — Progress Notes (Signed)
Juandiego Kolenovic Kindt Date of Birth: 02-07-1923 Medical Record #[id number]  History of Present Illness: Mr. Leon Morris is seen for followup after recent hospitalization. He was admitted on 06/12/2011 with a non-ST elevation myocardial infarction. He underwent emergent cardiac catheterization which demonstrated severe three-vessel obstructive disease. Initially we recommended coronary bypass surgery. However the patient had received a loading dose of Brilinta requiring a delay. In the meanwhile he developed unstable symptoms despite aggressive medical therapy and underwent emergent stenting of the right coronary and of the LAD. The LAD had stents placed in the mid and proximal vessel and was complicated by acute vessel closure requiring extension of the stent in the mid vessel. His subsequent hospital course was unremarkable. On outpatient followup with Dr. Earl Gala he was noted to have a firm pulsatile area over his right wrist at the radial access site. Vascular ultrasound confirmed a pseudoaneurysm. He is scheduled for surgical repair of this on Monday. He denies any recurrent chest pain. He has had mild intermittent shortness of breath. He does complain that he hasn't any energy. The right wrist is without pain.  Current Outpatient Prescriptions on File Prior to Visit  Medication Sig Dispense Refill  . aspirin EC 81 MG EC tablet Take 1 tablet (81 mg total) by mouth daily.      . citalopram (CELEXA) 20 MG tablet Take 30 mg by mouth daily.        Marland Kitchen ibuprofen (ADVIL,MOTRIN) 200 MG tablet Take 200 mg by mouth 2 (two) times daily as needed. FOR PAIN      . metoprolol tartrate (LOPRESSOR) 25 MG tablet Take 1.5 tablets (37.5 mg total) by mouth 2 (two) times daily.  120 tablet  6  . nitroGLYCERIN (NITROSTAT) 0.4 MG SL tablet Place 1 tablet (0.4 mg total) under the tongue every 5 (five) minutes as needed for chest pain.  25 tablet  3  . rosuvastatin (CRESTOR) 20 MG tablet Take 1 tablet (20 mg total) by mouth  daily at 6 PM.  30 tablet  6  . Ticagrelor (BRILINTA) 90 MG TABS tablet Take 1 tablet (90 mg total) by mouth 2 (two) times daily.  60 tablet  6    No Known Allergies  Past Medical History  Diagnosis Date  . Anxiety   . Hypercholesterolemia   . Hypertension 06/16/2011  . NSTEMI (non-ST elevated myocardial infarction)     06/16/2011   DR Swaziland   . Arthritis     LOWER BACK PAIN   . CAD (coronary artery disease)     Past Surgical History  Procedure Date  . Knee surgery   . Shoulder surgery   . Hernia repair   . Joint replacement   . Cardiac catheterization   . Coronary angioplasty     06/2011   DR Swaziland   . Cataract extraction w/ intraocular lens  implant, bilateral     History  Smoking status  . Former Smoker  . Quit date: 05/18/1941  Smokeless tobacco  . Not on file    History  Alcohol Use No    History reviewed. No pertinent family history.  Review of Systems: As noted in history of present illness.  All other systems were reviewed and are negative.  Physical Exam: BP 146/72  Pulse 54  Ht 5\' 4"  (1.626 m)  Wt 147 lb (66.679 kg)  BMI 25.23 kg/m2 He is a pleasant elderly white male in no acute distress. He is normocephalic, atraumatic. Pupils are equal round and reactive to light  accommodation. Extraocular movements are full. Oropharynx is clear. Neck is supple without JVD, adenopathy, thyromegaly, or bruits. Lungs are clear. Cardiac exam reveals a regular rate and rhythm without gallop or murmur. His abdomen is soft and nontender without masses or bruits. Extremities reveal a pulsatile, firm mass over the right radial access site measuring about 1.5 cm. It is nontender. There is no significant bruising. He has no edema. Neurologic exam is nonfocal. LABORATORY DATA:   Assessment / Plan:

## 2011-07-02 NOTE — Patient Instructions (Signed)
Continue your current medication.  When you run out of Brilinta we will switch you to Plavix 75 mg daily.  I will see you again in 6 weeks and we will check fasting lab work then

## 2011-07-02 NOTE — Consult Note (Signed)
Anesthesia:  Patient is a 78 year old male scheduled for repair of a right radial artery pseudoaneurysm on 07/05/11.  He is s/p cardiac cath via a radial artery approach on 06/13/11. He is s/p PTCA/DES X 1 mid RCA and PTCA/DES X 2 mid LAD, PTCA/DES X 1 proximal LAD on 06/14/11.  He is now on Brilinta which he is not suppose to stop preoperatively.    EKG from 06/15/11 showed SB with first degree AVB, inferior infarct.  Echo on 06/13/11 read by Dr. Myrtis Ser showed: Left ventricle: Technically limited study. He felt the overall EF was normal. He could not fully assess all walls, but did not  see an obvious wall abnormality. The cavity size was normal. Wall thickness was increased in a pattern of mild LVH.  The right atrium was mildly dilated.  Cath from 06/13/11 (See Notes tab, Op Note) showed: 1. Severe 3 vessel obstructive atherosclerotic coronary disease. The culprit lesion appears to be in the mid right coronary but he has complex three-vessel disease.  2. Well-preserved left ventricular systolic function with inferior hypokinesis.  Initially, CABG was recommended, but since he had received a loading dose of Brilinta and continued to have chest pain, Dr. Laneta Simmers recommended percutaneous intervention to the RCA and LAD lesions instead.  Labs noted.  Cr is 1.57, this appears stable when compared to his Cr levels over the last two weeks.  H/H 12.0/35.8.  Coags are WNL.    CXR on 07/01/11 showed no acute cardiopulmonary abnormality and scoliosis.  Patient is seeing his Cardiologist today (07/02/11) at 1345.  He reports that Dr. Swaziland is already aware of his planned procedure.  I'll follow-up on today's office note if it is in Epic before 1700 today.   Addendum: 07/01/10 1650  Dr. Elvis Coil note from today reviewed, plan to proceed.

## 2011-07-02 NOTE — Assessment & Plan Note (Signed)
Blood pressure readings at home have been acceptable. We will continue to monitor her cardiac rehabilitation. We have not started an ACE inhibitor because of his history of chronic renal insufficiency.

## 2011-07-04 MED ORDER — CEFAZOLIN SODIUM 1-5 GM-% IV SOLN
1.0000 g | Freq: Once | INTRAVENOUS | Status: DC
  Filled 2011-07-04: qty 50

## 2011-07-05 ENCOUNTER — Encounter (HOSPITAL_COMMUNITY): Payer: Self-pay | Admitting: Vascular Surgery

## 2011-07-05 ENCOUNTER — Encounter (HOSPITAL_COMMUNITY): Payer: Self-pay | Admitting: *Deleted

## 2011-07-05 ENCOUNTER — Encounter (HOSPITAL_COMMUNITY)
Admission: RE | Disposition: A | Discharge status: Home / Self Care | Payer: Self-pay | Source: Ambulatory Visit | Attending: Vascular Surgery

## 2011-07-05 ENCOUNTER — Ambulatory Visit (HOSPITAL_COMMUNITY)
Admission: RE | Admit: 2011-07-05 | Discharge: 2011-07-05 | Disposition: A | Discharge status: Home / Self Care | Payer: Medicare Other | Source: Ambulatory Visit | Attending: Vascular Surgery | Admitting: Vascular Surgery

## 2011-07-05 DIAGNOSIS — Z538 Procedure and treatment not carried out for other reasons: Secondary | ICD-10-CM | POA: Insufficient documentation

## 2011-07-05 DIAGNOSIS — I721 Aneurysm of artery of upper extremity: Secondary | ICD-10-CM | POA: Insufficient documentation

## 2011-07-05 SURGERY — REPAIR, PSEUDOANEURYSM
Anesthesia: General | Site: Arm Lower | Laterality: Right

## 2011-07-05 MED ORDER — SODIUM CHLORIDE 0.9 % IV SOLN: INTRAVENOUS | Status: DC

## 2011-07-05 MED ORDER — LACTATED RINGERS IV SOLN
INTRAVENOUS | Status: DC | PRN
  Administered 2011-07-05: 07:00:00 via INTRAVENOUS

## 2011-07-05 SURGICAL SUPPLY — 44 items
BANDAGE ESMARK 6X9 LF (GAUZE/BANDAGES/DRESSINGS) IMPLANT
BENZOIN TINCTURE PRP APPL 2/3 (GAUZE/BANDAGES/DRESSINGS) ×2 IMPLANT
BNDG ESMARK 6X9 LF (GAUZE/BANDAGES/DRESSINGS)
CANISTER SUCTION 2500CC (MISCELLANEOUS) ×2 IMPLANT
CLIP LIGATING EXTRA MED SLVR (CLIP) ×2 IMPLANT
CLIP LIGATING EXTRA SM BLUE (MISCELLANEOUS) ×2 IMPLANT
CLOTH BEACON ORANGE TIMEOUT ST (SAFETY) ×2 IMPLANT
COVER SURGICAL LIGHT HANDLE (MISCELLANEOUS) ×4 IMPLANT
CUFF TOURNIQUET SINGLE 18IN (TOURNIQUET CUFF) IMPLANT
CUFF TOURNIQUET SINGLE 24IN (TOURNIQUET CUFF) IMPLANT
CUFF TOURNIQUET SINGLE 34IN LL (TOURNIQUET CUFF) IMPLANT
CUFF TOURNIQUET SINGLE 44IN (TOURNIQUET CUFF) IMPLANT
DRAIN SNY 10X20 3/4 PERF (WOUND CARE) IMPLANT
DRAPE WARM FLUID 44X44 (DRAPE) ×2 IMPLANT
DRAPE X-RAY CASS 24X20 (DRAPES) IMPLANT
DRSG COVADERM 4X8 (GAUZE/BANDAGES/DRESSINGS) IMPLANT
ELECT REM PT RETURN 9FT ADLT (ELECTROSURGICAL) ×2
ELECTRODE REM PT RTRN 9FT ADLT (ELECTROSURGICAL) ×1 IMPLANT
EVACUATOR SILICONE 100CC (DRAIN) IMPLANT
GLOVE SS BIOGEL STRL SZ 7.5 (GLOVE) ×1 IMPLANT
GLOVE SUPERSENSE BIOGEL SZ 7.5 (GLOVE) ×1
GOWN STRL NON-REIN LRG LVL3 (GOWN DISPOSABLE) ×6 IMPLANT
KIT BASIN OR (CUSTOM PROCEDURE TRAY) ×2 IMPLANT
KIT ROOM TURNOVER OR (KITS) ×2 IMPLANT
NS IRRIG 1000ML POUR BTL (IV SOLUTION) ×4 IMPLANT
PACK PERIPHERAL VASCULAR (CUSTOM PROCEDURE TRAY) ×2 IMPLANT
PAD ARMBOARD 7.5X6 YLW CONV (MISCELLANEOUS) ×4 IMPLANT
PADDING CAST COTTON 6X4 STRL (CAST SUPPLIES) IMPLANT
SET COLLECT BLD 21X3/4 12 (NEEDLE) IMPLANT
STAPLER VISISTAT 35W (STAPLE) IMPLANT
STOPCOCK 4 WAY LG BORE MALE ST (IV SETS) IMPLANT
STRIP CLOSURE SKIN 1/2X4 (GAUZE/BANDAGES/DRESSINGS) ×2 IMPLANT
SUT ETHILON 3 0 PS 1 (SUTURE) IMPLANT
SUT PROLENE 5 0 C 1 24 (SUTURE) IMPLANT
SUT PROLENE 6 0 CC (SUTURE) IMPLANT
SUT VIC AB 2-0 CTX 36 (SUTURE) ×2 IMPLANT
SUT VIC AB 3-0 SH 27 (SUTURE) ×1
SUT VIC AB 3-0 SH 27X BRD (SUTURE) ×1 IMPLANT
TOWEL OR 17X24 6PK STRL BLUE (TOWEL DISPOSABLE) ×4 IMPLANT
TOWEL OR 17X26 10 PK STRL BLUE (TOWEL DISPOSABLE) ×2 IMPLANT
TRAY FOLEY CATH 14FRSI W/METER (CATHETERS) ×2 IMPLANT
TUBING EXTENTION W/L.L. (IV SETS) IMPLANT
UNDERPAD 30X30 INCONTINENT (UNDERPADS AND DIAPERS) ×2 IMPLANT
WATER STERILE IRR 1000ML POUR (IV SOLUTION) ×2 IMPLANT

## 2011-07-05 NOTE — Progress Notes (Signed)
The patient presented today for repair of his right radial false aneurysm. On physical exam in the holding area he had thrombosed the false aneurysm and had a diminishing hematoma present. He does have a palpable radial pulse distal to the hematoma. I discussed this at length with the patient and his daughters. I explained that he is self treated this false aneurysm with spontaneous thrombosis. Surgery was canceled and he was discharged. He will followup with me on an as-needed basis.

## 2011-07-05 NOTE — Preoperative (Signed)
Beta Blockers   Reason not to administer Beta Blockers:Not Applicable 

## 2011-07-05 NOTE — Progress Notes (Signed)
Pt. Stated on Saturday 1/26 he took a nitroglycerine tablet due to being short of breath.Reports no chest pain.stated the shortness of breath resolved.

## 2011-07-06 ENCOUNTER — Encounter (HOSPITAL_COMMUNITY): Payer: Self-pay | Admitting: Vascular Surgery

## 2011-07-13 ENCOUNTER — Telehealth: Payer: Self-pay | Admitting: Cardiology

## 2011-07-13 NOTE — Telephone Encounter (Signed)
New Problem   Patient Would like a return call at hm# [phone], need clearance from Dr. Swaziland  to go play golf!!!

## 2011-07-13 NOTE — Telephone Encounter (Signed)
Patient called no answer.

## 2011-07-14 NOTE — Telephone Encounter (Signed)
Patient called and told okay with Dr. Swaziland to play golf.

## 2011-07-15 ENCOUNTER — Encounter (HOSPITAL_COMMUNITY)
Admission: RE | Admit: 2011-07-15 | Discharge: 2011-07-15 | Disposition: A | Discharge status: Home / Self Care | Payer: Medicare Other | Source: Ambulatory Visit | Attending: Cardiology | Admitting: Cardiology

## 2011-07-15 ENCOUNTER — Encounter (HOSPITAL_COMMUNITY): Payer: Self-pay

## 2011-07-15 DIAGNOSIS — E785 Hyperlipidemia, unspecified: Secondary | ICD-10-CM | POA: Insufficient documentation

## 2011-07-15 DIAGNOSIS — Z79899 Other long term (current) drug therapy: Secondary | ICD-10-CM | POA: Insufficient documentation

## 2011-07-15 DIAGNOSIS — I214 Non-ST elevation (NSTEMI) myocardial infarction: Secondary | ICD-10-CM | POA: Insufficient documentation

## 2011-07-15 DIAGNOSIS — Z5189 Encounter for other specified aftercare: Secondary | ICD-10-CM | POA: Insufficient documentation

## 2011-07-15 DIAGNOSIS — Z7982 Long term (current) use of aspirin: Secondary | ICD-10-CM | POA: Insufficient documentation

## 2011-07-15 DIAGNOSIS — Z87891 Personal history of nicotine dependence: Secondary | ICD-10-CM | POA: Insufficient documentation

## 2011-07-15 DIAGNOSIS — I10 Essential (primary) hypertension: Secondary | ICD-10-CM | POA: Insufficient documentation

## 2011-07-15 DIAGNOSIS — Z7901 Long term (current) use of anticoagulants: Secondary | ICD-10-CM | POA: Insufficient documentation

## 2011-07-15 DIAGNOSIS — I2510 Atherosclerotic heart disease of native coronary artery without angina pectoris: Secondary | ICD-10-CM | POA: Insufficient documentation

## 2011-07-15 DIAGNOSIS — Z9861 Coronary angioplasty status: Secondary | ICD-10-CM | POA: Insufficient documentation

## 2011-07-15 DIAGNOSIS — F411 Generalized anxiety disorder: Secondary | ICD-10-CM | POA: Insufficient documentation

## 2011-07-15 NOTE — Progress Notes (Signed)
Cardiac Rehab orientation - 0825 Pt in today for cardiac rehab orientation.  Pt noted to have hr in the mid 40's.  Pt placed on the monitor - showed Sinus brady with 1st degree block, bp 122/70.  Pt asymptomatic with no complaints.  [facility]master paged, [facility] PA returned call.  Assessment findings relayed.  No further intervention warranted per Pa.  Dr. Swaziland sent an in basket message along with scanned ekg strips.

## 2011-07-16 ENCOUNTER — Other Ambulatory Visit: Payer: Self-pay

## 2011-07-16 ENCOUNTER — Telehealth: Payer: Self-pay | Admitting: Cardiology

## 2011-07-16 MED ORDER — METOPROLOL TARTRATE 25 MG PO TABS: 25.0000 mg | ORAL_TABLET | Freq: Two times a day (BID) | ORAL | Status: DC

## 2011-07-16 NOTE — Telephone Encounter (Signed)
New Msg: Pt daughter calling regarding pt medication changes. Pt recently went to first cardiac rehab visit and pt HR was 40. Pt was told to decrease the amount of metoprolol pt takes. Pt daughter now calling wanting to know how much metoprolol pt should be taking. Please return pt daughter call to discuss further.

## 2011-07-16 NOTE — Telephone Encounter (Signed)
Patient's daughter called stating patient went to cardiac rehab yesterday 07/15/11 and heart rate 40 beats/min.Cardiac rehab nurse sent Dr.Jordan a message.Dr.Jordan decreased metoprolol to 25 mg twice daily.Daughter and patient were told.

## 2011-07-19 ENCOUNTER — Encounter (HOSPITAL_COMMUNITY)
Admission: RE | Admit: 2011-07-19 | Discharge: 2011-07-19 | Disposition: A | Discharge status: Home / Self Care | Payer: Medicare Other | Source: Ambulatory Visit | Attending: Cardiology | Admitting: Cardiology

## 2011-07-20 NOTE — Progress Notes (Signed)
Zyrell started cardiac rehab yesterday.  Telemetry Sinus with a first degree heart block t wave inversion.  T Wave inversion not present in lead II for 12 Lead ECG from January 8 th.  Faxed ECG tracing to Dr Elvis Coil office for review. Emmitt tolerated exercise without major difficulty.  Azazel complained of Left Knee pain when walking the track. Havish had knee replacement surgery in 2011.  Will modify exercise for patients comfort as necessary.  Onesimo's heart rate ranged from 57 to 88 during exercise.  Adonus said he is not taking his 81 mg aspirin.  Dr Elvis Coil office called and notified. Spoke with Elnita Maxwell  Dr Elvis Coil nurse.

## 2011-07-21 ENCOUNTER — Encounter (HOSPITAL_COMMUNITY)
Admission: RE | Admit: 2011-07-21 | Discharge: 2011-07-21 | Disposition: A | Discharge status: Home / Self Care | Payer: Medicare Other | Source: Ambulatory Visit | Attending: Cardiology | Admitting: Cardiology

## 2011-07-21 NOTE — Progress Notes (Signed)
Reviewed home exercise with pt today.  Pt plans to go to the gym and use the bike and track for exercise.  Asked pt to hold returning to weights until next month.  Reviewed THR, pulse (needs practice), RPE, sign and symptoms, NTG use, and when to call 911 or MD.  Pt voiced understanding. Fabio Pierce, MA, ACSM RCEP

## 2011-07-22 NOTE — Progress Notes (Signed)
Leverne Amrhein Squillace 78 y.o. male       Nutrition Screen                                                                    YES  NO Do you live in a nursing home?  x   Do you eat out more than 3 times/week?    x If yes, how many times per week do you eat out?  Do you have food allergies?   x If yes, what are you allergic to?  Have you gained or lost more than 10 lbs without trying?               x If yes, how much weight have you lost and over what time period?  lbs gained or lost over  weeks/month  Do you want to lose weight?     x If yes, what is a goal weight or amount of weight you would like to lose?  lb  Do you eat alone most of the time?   x   Do you eat less than 2 meals/day?  x If yes, how many meals do you eat?  Do you drink more than 3 alcohol drinks/day?  x If yes, how many drinks per day?  Are you having trouble with constipation? * x  If yes, what are you doing to help relieve constipation?  Do you have financial difficulties with buying food?*    x   Are you experiencing regular nausea/ vomiting?*     x   Do you have a poor appetite? *                                        x   Do you have trouble chewing/swallowing? *   x    Pt with diagnoses of:  x Stent/ PTCA x Dyslipidemia  / HDL< 40 / LDL>70 / High TG      x >31 years old  x %  Body fat >goal / Body Mass Index >25 x HTN / BP >120/80 x MI x Pre-diabetes       Pt Risk Score   5       Diagnosis Risk Score  35       Total Risk Score   40                         High Risk               x Low Risk    HT: 62" Ht Readings from Last 1 Encounters:  07/15/11 5\' 2"  (1.575 m)    WT:   145.4 lb (66.1 kg) Wt Readings from Last 3 Encounters:  07/15/11 145 lb 11.6 oz (66.1 kg)  07/02/11 147 lb (66.679 kg)  07/01/11 147 lb 6.4 oz (66.86 kg)     IBW 53.6 123%IBW BMI 26.7 31.2%body fat  Meds reviewed Past Medical History  Diagnosis Date  . Anxiety   . Hypercholesterolemia   . Hypertension 06/16/2011  . NSTEMI (non-ST  elevated myocardial infarction)     06/16/2011   DR  Swaziland   . Arthritis     LOWER BACK PAIN   . CAD (coronary artery disease)         Activity level: Pt is active Wt goal: 145 lb ( 66.1 kg) Current tobacco use? No      Food/Drug Interaction? No       Labs:  Lipid Panel     Component Value Date/Time   CHOL 130 06/13/2011 0500   TRIG 84 06/13/2011 0500   HDL 41 06/13/2011 0500   CHOLHDL 3.2 06/13/2011 0500   VLDL 17 06/13/2011 0500   LDLCALC 72 06/13/2011 0500   Lab Results  Component Value Date   HGBA1C 6.0* 06/12/2011   07/01/11 Glucose 92  LDL goal: < 70      MI, DM, Carotid or PVD and > 2:      HTN, family h/o, > 76 yo male,  Estimated Daily Nutrition Needs for: ? wt maintenance 1800-2100 Kcal , Total Fat 60-65gm, Saturated Fat 14-16 gm, Trans Fat 2.0-2.3 gm,  Sodium less than 1500 mg

## 2011-07-23 ENCOUNTER — Encounter (HOSPITAL_COMMUNITY)
Admission: RE | Admit: 2011-07-23 | Discharge: 2011-07-23 | Disposition: A | Discharge status: Home / Self Care | Payer: Medicare Other | Source: Ambulatory Visit | Attending: Cardiology | Admitting: Cardiology

## 2011-07-23 NOTE — Progress Notes (Signed)
Dr Swaziland aware of t wave inversion.  No new orders received. Cheryl Dr Elvis Coil nurse notified the patient over the phone that he is supposed to take his baby aspirin every day.

## 2011-07-26 ENCOUNTER — Encounter (HOSPITAL_COMMUNITY)
Admission: RE | Admit: 2011-07-26 | Discharge: 2011-07-26 | Disposition: A | Discharge status: Home / Self Care | Payer: Medicare Other | Source: Ambulatory Visit | Attending: Cardiology | Admitting: Cardiology

## 2011-07-28 ENCOUNTER — Encounter (HOSPITAL_COMMUNITY)
Admission: RE | Admit: 2011-07-28 | Discharge: 2011-07-28 | Disposition: A | Discharge status: Home / Self Care | Payer: Medicare Other | Source: Ambulatory Visit | Attending: Cardiology | Admitting: Cardiology

## 2011-07-30 ENCOUNTER — Encounter (HOSPITAL_COMMUNITY)
Admission: RE | Admit: 2011-07-30 | Discharge: 2011-07-30 | Disposition: A | Discharge status: Home / Self Care | Payer: Medicare Other | Source: Ambulatory Visit | Attending: Cardiology | Admitting: Cardiology

## 2011-07-30 NOTE — Progress Notes (Signed)
Leon Morris 78 y.o. male Nutrition Note Spoke with pt.  Nutrition Plan and Nutrition Survey reviewed with pt. Pt is following Step 1 of the Therapeutic Lifestyle Changes diet. HbA1c/Pre-diabetes discussed. Pt c/o constipation.  High fiber diet and drinking more fluids reviewed. Pt expressed understanding. Nutrition Diagnosis   Food-and nutrition-related knowledge deficit related to lack of exposure to information as related to diagnosis of: ? CVD ? Pre-DM (A1c 6.0)   Nutrition RX/ Estimated Daily Nutrition Needs for: wt maintenance 1800-2100 Kcal, 60-65 gm fat, 14-16 gm sat fat, 2.0-2.3 gm trans-fat, <1500 mg sodium  Nutrition Intervention   Pt's individual nutrition plan including cholesterol goals reviewed with pt.   Benefits of adopting Therapeutic Lifestyle Changes discussed when Medficts reviewed.   Pt to attend the Portion Distortion class   Pt to attend the  ? Nutrition I class                     ? Nutrition II class   Pt given handouts for: ? pre- DM ? High fiber diet for Constipation.   Continue client-centered nutrition education by RD, as part of interdisciplinary care. Goal(s)   Pt to describe the benefit of including fruits, vegetables, whole grains, and low-fat dairy products in a heart healthy meal plan. Monitor and Evaluate progress toward nutrition goal with team.  Nutrition Risk: Low

## 2011-08-02 ENCOUNTER — Telehealth: Payer: Self-pay

## 2011-08-02 ENCOUNTER — Encounter (HOSPITAL_COMMUNITY)
Admission: RE | Admit: 2011-08-02 | Discharge: 2011-08-02 | Disposition: A | Discharge status: Home / Self Care | Payer: Medicare Other | Source: Ambulatory Visit | Attending: Cardiology | Admitting: Cardiology

## 2011-08-02 NOTE — Telephone Encounter (Signed)
Leon Morris at Cardiac Rehab called was told spoke with Norma Fredrickson NP, advised patient to hold off on cardiac rehab until after he sees Dr.Jordan this wed 08/04/11.

## 2011-08-02 NOTE — Progress Notes (Signed)
Leon Morris reports having an intermittent chest pain when taking a deep breath.  Leon Morris says he noticed the discomfort on Friday afternoon.  Leon Morris says the pain is not severe.  Telemetry rhythm Sinus Leon Morris with t wave inversion 53.  Dr Elvis Coil office called and notified spoke with Leon Morris, Dr Elvis Coil nurse.  Faxed today's ECG tracing over with vital signs.  Reviewed by Leon Fredrickson NP.  Leon Morris advised that Leon Morris not exercise until he see's Dr Swaziland on Wed.  Leon Morris left without complaints.  Blood pressure stable.  Lung fields clear upon auscultation SAO2 95% on room air.

## 2011-08-02 NOTE — Telephone Encounter (Signed)
Received phone call from Leon Morris,Cardiac Rehab at [facility] stating patient has been having chest pain when he takes a deep breath.Stated pain comes and goes.Appointment with Dr.Jordan wed 08/04/11.Will check with Norma Fredrickson NP.

## 2011-08-04 ENCOUNTER — Other Ambulatory Visit: Payer: Medicare Other

## 2011-08-04 ENCOUNTER — Encounter: Payer: Self-pay | Admitting: Cardiology

## 2011-08-04 ENCOUNTER — Encounter (HOSPITAL_COMMUNITY)
Admission: RE | Admit: 2011-08-04 | Discharge: 2011-08-04 | Disposition: A | Discharge status: Home / Self Care | Payer: Medicare Other | Source: Ambulatory Visit | Attending: Cardiology | Admitting: Cardiology

## 2011-08-04 ENCOUNTER — Ambulatory Visit (INDEPENDENT_AMBULATORY_CARE_PROVIDER_SITE_OTHER): Payer: Medicare Other | Admitting: Cardiology

## 2011-08-04 VITALS — BP 118/72 | HR 58 | Ht 62.0 in | Wt 143.0 lb

## 2011-08-04 DIAGNOSIS — E785 Hyperlipidemia, unspecified: Secondary | ICD-10-CM

## 2011-08-04 DIAGNOSIS — I10 Essential (primary) hypertension: Secondary | ICD-10-CM

## 2011-08-04 DIAGNOSIS — I729 Aneurysm of unspecified site: Secondary | ICD-10-CM

## 2011-08-04 DIAGNOSIS — I214 Non-ST elevation (NSTEMI) myocardial infarction: Secondary | ICD-10-CM

## 2011-08-04 DIAGNOSIS — I2510 Atherosclerotic heart disease of native coronary artery without angina pectoris: Secondary | ICD-10-CM

## 2011-08-04 LAB — BASIC METABOLIC PANEL
BUN: 33 mg/dL — ABNORMAL HIGH (ref 6–23)
Chloride: 106 mEq/L (ref 96–112)
Glucose, Bld: 89 mg/dL (ref 70–99)
Potassium: 4 mEq/L (ref 3.5–5.1)

## 2011-08-04 LAB — LIPID PANEL
LDL Cholesterol: 37 mg/dL (ref 0–99)
VLDL: 14 mg/dL (ref 0.0–40.0)

## 2011-08-04 LAB — HEPATIC FUNCTION PANEL
Bilirubin, Direct: 0.2 mg/dL (ref 0.0–0.3)
Total Bilirubin: 0.8 mg/dL (ref 0.3–1.2)
Total Protein: 6.6 g/dL (ref 6.0–8.3)

## 2011-08-04 LAB — CBC WITH DIFFERENTIAL/PLATELET
Basophils Relative: 0.2 % (ref 0.0–3.0)
Eosinophils Relative: 2 % (ref 0.0–5.0)
HCT: 36.9 % — ABNORMAL LOW (ref 39.0–52.0)
Hemoglobin: 12.3 g/dL — ABNORMAL LOW (ref 13.0–17.0)
Lymphs Abs: 1.9 10*3/uL (ref 0.7–4.0)
Monocytes Relative: 9.1 % (ref 3.0–12.0)
Neutro Abs: 7.4 10*3/uL (ref 1.4–7.7)
WBC: 10.5 10*3/uL (ref 4.5–10.5)

## 2011-08-04 MED ORDER — CLOPIDOGREL BISULFATE 75 MG PO TABS: 75.0000 mg | ORAL_TABLET | Freq: Every day | ORAL | Status: DC

## 2011-08-04 NOTE — Assessment & Plan Note (Signed)
Blood pressure is under excellent control on Toprol only.

## 2011-08-04 NOTE — Progress Notes (Signed)
Leon Morris Date of Birth: 17-Aug-1922 Medical Record #[id number]  History of Present Illness: Leon Morris is seen for followup. He is a. He has been progressing well with cardiac rehabilitation. This week he developed a mild pain in his lower midsternum and epigastric region. It hurt to take a deep breath. He states this felt like a sharp pin. It has resolved. He denies any cough or fever. He's had no anginal pain. He has continued walking on. 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 He now just has a knot there and it is nontender. He still notes easy bruisability.  Current Outpatient Prescriptions on File Prior to Visit  Medication Sig Dispense Refill  . aspirin EC 81 MG EC tablet Take 1 tablet (81 mg total) by mouth daily.      . citalopram (CELEXA) 20 MG tablet Take 20 mg by mouth daily.       . metoprolol tartrate (LOPRESSOR) 25 MG tablet Take 1 tablet (25 mg total) by mouth 2 (two) times daily.  60 tablet  6  . nitroGLYCERIN (NITROSTAT) 0.4 MG SL tablet Place 1 tablet (0.4 mg total) under the tongue every 5 (five) minutes as needed for chest pain.  25 tablet  3  . rosuvastatin (CRESTOR) 20 MG tablet Take 1 tablet (20 mg total) by mouth daily at 6 PM.  30 tablet  6   Current Facility-Administered Medications on File Prior to Visit  Medication Dose Route Frequency Provider Last Rate Last Dose  . lactated ringers infusion    Continuous PRN Elizbeth Squires, CRNA        No Known Allergies  Past Medical History  Diagnosis Date  . Anxiety   . Hypercholesterolemia   . Hypertension 06/16/2011  . NSTEMI (non-ST elevated myocardial infarction)     06/16/2011   DR [place]   . Arthritis     LOWER BACK PAIN   . CAD (coronary artery disease)     Past Surgical History  Procedure Date  . Knee surgery   . Shoulder surgery   . Hernia repair   . Joint replacement   . Cardiac catheterization   . Coronary angioplasty     06/2011   DR [place]   . Cataract  extraction w/ intraocular lens  implant, bilateral   . False aneurysm repair 07/05/2011    Procedure: REPAIR FALSE ANEURYSM;  Surgeon: Larina Earthly, MD;  Location: [facility] OR;  Service: Vascular;  Laterality: Right;  repair false aneurysm right arm    History  Smoking status  . Former Smoker  . Quit date: 05/18/1941  Smokeless tobacco  . Not on file    History  Alcohol Use No    History reviewed. No pertinent family history.  Review of Systems: As noted in history of present illness.  All other systems were reviewed and are negative.  Physical Exam: BP 118/72  Pulse 58  Ht 5\' 2"  (1.575 m)  Wt 143 lb (64.864 kg)  BMI 26.15 kg/m2 He is a pleasant elderly white male in no acute distress. He is normocephalic, atraumatic. Pupils are equal round and reactive to light accommodation. Extraocular movements are full. Oropharynx is clear. Neck is supple without JVD, adenopathy, thyromegaly, or bruits. Lungs are clear. Cardiac exam reveals a regular rate and rhythm without gallop or murmur. He has no significant chest wall tenderness to palpation but localizes his prior pain to the subxiphoid area. His abdomen is soft and nontender without masses or bruits.  Extremities reveal a small firm knot in the right radial area without pulsation or bruit.. It is nontender. There is no significant bruising. He has no edema. Neurologic exam is nonfocal. LABORATORY DATA:   Assessment / Plan:

## 2011-08-04 NOTE — Assessment & Plan Note (Signed)
As noted in the review he has had previous stenting of the midright coronary and extensive stenting of the LAD. He has no significant anginal symptoms. I think that his recent subxiphoid pain was noncardiac and has resolved. He is cleared to resume cardiac rehabilitation. I will plan a followup again in 4 months. We'll continue with his antiplatelet therapy, Crestor, and metoprolol. Given the cost of Brilinta we will switch him to Plavix 75 mg per day.

## 2011-08-04 NOTE — Assessment & Plan Note (Signed)
This has healed spontaneously. No further therapy needed.

## 2011-08-04 NOTE — Assessment & Plan Note (Signed)
We will repeat a fasting lipid and chemistries today on Crestor.

## 2011-08-04 NOTE — Patient Instructions (Signed)
We will switch from brilinta to Plavix 75 mg daily.  We will call with the results of your lab today.  I will see you again in 4 months.

## 2011-08-06 ENCOUNTER — Encounter (HOSPITAL_COMMUNITY)
Admission: RE | Admit: 2011-08-06 | Discharge: 2011-08-06 | Disposition: A | Discharge status: Home / Self Care | Payer: Medicare Other | Source: Ambulatory Visit | Attending: Cardiology | Admitting: Cardiology

## 2011-08-06 DIAGNOSIS — I2510 Atherosclerotic heart disease of native coronary artery without angina pectoris: Secondary | ICD-10-CM | POA: Insufficient documentation

## 2011-08-06 DIAGNOSIS — I214 Non-ST elevation (NSTEMI) myocardial infarction: Secondary | ICD-10-CM | POA: Insufficient documentation

## 2011-08-06 DIAGNOSIS — Z7901 Long term (current) use of anticoagulants: Secondary | ICD-10-CM | POA: Insufficient documentation

## 2011-08-06 DIAGNOSIS — Z87891 Personal history of nicotine dependence: Secondary | ICD-10-CM | POA: Insufficient documentation

## 2011-08-06 DIAGNOSIS — Z5189 Encounter for other specified aftercare: Secondary | ICD-10-CM | POA: Insufficient documentation

## 2011-08-06 DIAGNOSIS — I10 Essential (primary) hypertension: Secondary | ICD-10-CM | POA: Insufficient documentation

## 2011-08-06 DIAGNOSIS — Z9861 Coronary angioplasty status: Secondary | ICD-10-CM | POA: Insufficient documentation

## 2011-08-06 DIAGNOSIS — Z79899 Other long term (current) drug therapy: Secondary | ICD-10-CM | POA: Insufficient documentation

## 2011-08-06 DIAGNOSIS — Z7982 Long term (current) use of aspirin: Secondary | ICD-10-CM | POA: Insufficient documentation

## 2011-08-06 DIAGNOSIS — E785 Hyperlipidemia, unspecified: Secondary | ICD-10-CM | POA: Insufficient documentation

## 2011-08-06 DIAGNOSIS — F411 Generalized anxiety disorder: Secondary | ICD-10-CM | POA: Insufficient documentation

## 2011-08-09 ENCOUNTER — Encounter (HOSPITAL_COMMUNITY)
Admission: RE | Admit: 2011-08-09 | Discharge: 2011-08-09 | Disposition: A | Discharge status: Home / Self Care | Payer: Medicare Other | Source: Ambulatory Visit | Attending: Cardiology | Admitting: Cardiology

## 2011-08-11 ENCOUNTER — Encounter (HOSPITAL_COMMUNITY)
Admission: RE | Admit: 2011-08-11 | Discharge: 2011-08-11 | Disposition: A | Discharge status: Home / Self Care | Payer: Medicare Other | Source: Ambulatory Visit | Attending: Cardiology | Admitting: Cardiology

## 2011-08-13 ENCOUNTER — Encounter (HOSPITAL_COMMUNITY)
Admission: RE | Admit: 2011-08-13 | Discharge: 2011-08-13 | Disposition: A | Discharge status: Home / Self Care | Payer: Medicare Other | Source: Ambulatory Visit | Attending: Cardiology | Admitting: Cardiology

## 2011-08-16 ENCOUNTER — Encounter (HOSPITAL_COMMUNITY)
Admission: RE | Admit: 2011-08-16 | Discharge: 2011-08-16 | Disposition: A | Discharge status: Home / Self Care | Payer: Medicare Other | Source: Ambulatory Visit | Attending: Cardiology | Admitting: Cardiology

## 2011-08-18 ENCOUNTER — Encounter (HOSPITAL_COMMUNITY)
Admission: RE | Admit: 2011-08-18 | Discharge: 2011-08-18 | Disposition: A | Discharge status: Home / Self Care | Payer: Medicare Other | Source: Ambulatory Visit | Attending: Cardiology | Admitting: Cardiology

## 2011-08-20 ENCOUNTER — Encounter (HOSPITAL_COMMUNITY)
Admission: RE | Admit: 2011-08-20 | Discharge: 2011-08-20 | Disposition: A | Discharge status: Home / Self Care | Payer: Medicare Other | Source: Ambulatory Visit | Attending: Cardiology | Admitting: Cardiology

## 2011-08-23 ENCOUNTER — Encounter (HOSPITAL_COMMUNITY)
Admission: RE | Admit: 2011-08-23 | Discharge: 2011-08-23 | Disposition: A | Discharge status: Home / Self Care | Payer: Medicare Other | Source: Ambulatory Visit | Attending: Cardiology | Admitting: Cardiology

## 2011-08-25 ENCOUNTER — Encounter (HOSPITAL_COMMUNITY)
Admission: RE | Admit: 2011-08-25 | Discharge: 2011-08-25 | Disposition: A | Discharge status: Home / Self Care | Payer: Medicare Other | Source: Ambulatory Visit | Attending: Cardiology | Admitting: Cardiology

## 2011-08-27 ENCOUNTER — Encounter (HOSPITAL_COMMUNITY)
Admission: RE | Admit: 2011-08-27 | Discharge: 2011-08-27 | Disposition: A | Discharge status: Home / Self Care | Payer: Medicare Other | Source: Ambulatory Visit | Attending: Cardiology | Admitting: Cardiology

## 2011-08-30 ENCOUNTER — Encounter (HOSPITAL_COMMUNITY)
Admission: RE | Admit: 2011-08-30 | Discharge: 2011-08-30 | Disposition: A | Discharge status: Home / Self Care | Payer: Medicare Other | Source: Ambulatory Visit | Attending: Cardiology | Admitting: Cardiology

## 2011-08-30 NOTE — Progress Notes (Signed)
Patient complained of having palpitations on the nustep.  No ectopy noted on the monitor, blood pressure stable. Had patient to rest for a few minutes then continued exercise without further complaints. Will continue to monitor.

## 2011-09-01 ENCOUNTER — Encounter (HOSPITAL_COMMUNITY)
Admission: RE | Admit: 2011-09-01 | Discharge: 2011-09-01 | Disposition: A | Discharge status: Home / Self Care | Payer: Medicare Other | Source: Ambulatory Visit | Attending: Cardiology | Admitting: Cardiology

## 2011-09-01 ENCOUNTER — Telehealth: Payer: Self-pay | Admitting: Cardiology

## 2011-09-01 NOTE — Telephone Encounter (Signed)
LOVx2,Doppler,Echo,12 faxed to [facility]  @ [phone] 09/01/11/KM

## 2011-09-03 ENCOUNTER — Encounter (HOSPITAL_COMMUNITY)
Admission: RE | Admit: 2011-09-03 | Discharge: 2011-09-03 | Disposition: A | Discharge status: Home / Self Care | Payer: Medicare Other | Source: Ambulatory Visit | Attending: Cardiology | Admitting: Cardiology

## 2011-09-06 ENCOUNTER — Encounter (HOSPITAL_COMMUNITY)
Admission: RE | Admit: 2011-09-06 | Discharge: 2011-09-06 | Disposition: A | Discharge status: Home / Self Care | Payer: Medicare Other | Source: Ambulatory Visit | Attending: Cardiology | Admitting: Cardiology

## 2011-09-06 DIAGNOSIS — I10 Essential (primary) hypertension: Secondary | ICD-10-CM | POA: Insufficient documentation

## 2011-09-06 DIAGNOSIS — F411 Generalized anxiety disorder: Secondary | ICD-10-CM | POA: Insufficient documentation

## 2011-09-06 DIAGNOSIS — E785 Hyperlipidemia, unspecified: Secondary | ICD-10-CM | POA: Insufficient documentation

## 2011-09-06 DIAGNOSIS — I2510 Atherosclerotic heart disease of native coronary artery without angina pectoris: Secondary | ICD-10-CM | POA: Insufficient documentation

## 2011-09-06 DIAGNOSIS — Z79899 Other long term (current) drug therapy: Secondary | ICD-10-CM | POA: Insufficient documentation

## 2011-09-06 DIAGNOSIS — Z5189 Encounter for other specified aftercare: Secondary | ICD-10-CM | POA: Insufficient documentation

## 2011-09-06 DIAGNOSIS — Z7982 Long term (current) use of aspirin: Secondary | ICD-10-CM | POA: Insufficient documentation

## 2011-09-06 DIAGNOSIS — Z7901 Long term (current) use of anticoagulants: Secondary | ICD-10-CM | POA: Insufficient documentation

## 2011-09-06 DIAGNOSIS — Z87891 Personal history of nicotine dependence: Secondary | ICD-10-CM | POA: Insufficient documentation

## 2011-09-06 DIAGNOSIS — Z9861 Coronary angioplasty status: Secondary | ICD-10-CM | POA: Insufficient documentation

## 2011-09-06 DIAGNOSIS — I214 Non-ST elevation (NSTEMI) myocardial infarction: Secondary | ICD-10-CM | POA: Insufficient documentation

## 2011-09-08 ENCOUNTER — Encounter (HOSPITAL_COMMUNITY)
Admission: RE | Admit: 2011-09-08 | Discharge: 2011-09-08 | Disposition: A | Discharge status: Home / Self Care | Payer: Medicare Other | Source: Ambulatory Visit | Attending: Cardiology | Admitting: Cardiology

## 2011-09-08 ENCOUNTER — Telehealth: Payer: Self-pay

## 2011-09-08 NOTE — Telephone Encounter (Signed)
Received message from [facility] at cardiac rehab stating patient is having muscle soreness in upper thighs for 1 month.Spoke to Dr.Jordan he advised to decrease crestor to 10 mg daily.Spoke to patient's daughter advised to decrease crestor to 10 mg daily and call back if not better.

## 2011-09-08 NOTE — Progress Notes (Signed)
Leon Morris complains of having muscle soreness in his upper thigh when he rides a bike or walks.  Bryndan says the symptoms have been going on a month. Will notify Dr [place].

## 2011-09-10 ENCOUNTER — Encounter (HOSPITAL_COMMUNITY)
Admission: RE | Admit: 2011-09-10 | Discharge: 2011-09-10 | Disposition: A | Discharge status: Home / Self Care | Payer: Medicare Other | Source: Ambulatory Visit | Attending: Cardiology | Admitting: Cardiology

## 2011-09-13 ENCOUNTER — Encounter (HOSPITAL_COMMUNITY)
Admission: RE | Admit: 2011-09-13 | Discharge: 2011-09-13 | Disposition: A | Discharge status: Home / Self Care | Payer: Medicare Other | Source: Ambulatory Visit | Attending: Cardiology | Admitting: Cardiology

## 2011-09-15 ENCOUNTER — Encounter (HOSPITAL_COMMUNITY)
Admission: RE | Admit: 2011-09-15 | Discharge: 2011-09-15 | Disposition: A | Discharge status: Home / Self Care | Payer: Medicare Other | Source: Ambulatory Visit | Attending: Cardiology | Admitting: Cardiology

## 2011-09-17 ENCOUNTER — Encounter (HOSPITAL_COMMUNITY)
Admission: RE | Admit: 2011-09-17 | Discharge: 2011-09-17 | Disposition: A | Discharge status: Home / Self Care | Payer: Medicare Other | Source: Ambulatory Visit | Attending: Cardiology | Admitting: Cardiology

## 2011-09-20 ENCOUNTER — Encounter (HOSPITAL_COMMUNITY)
Admission: RE | Admit: 2011-09-20 | Discharge: 2011-09-20 | Disposition: A | Discharge status: Home / Self Care | Payer: Medicare Other | Source: Ambulatory Visit | Attending: Cardiology | Admitting: Cardiology

## 2011-09-22 ENCOUNTER — Encounter (HOSPITAL_COMMUNITY)
Admission: RE | Admit: 2011-09-22 | Discharge: 2011-09-22 | Disposition: A | Discharge status: Home / Self Care | Payer: Medicare Other | Source: Ambulatory Visit | Attending: Cardiology | Admitting: Cardiology

## 2011-09-24 ENCOUNTER — Encounter (HOSPITAL_COMMUNITY)
Admission: RE | Admit: 2011-09-24 | Discharge: 2011-09-24 | Disposition: A | Discharge status: Home / Self Care | Payer: Medicare Other | Source: Ambulatory Visit | Attending: Cardiology | Admitting: Cardiology

## 2011-09-27 ENCOUNTER — Encounter (HOSPITAL_COMMUNITY)
Admission: RE | Admit: 2011-09-27 | Discharge: 2011-09-27 | Disposition: A | Discharge status: Home / Self Care | Payer: Medicare Other | Source: Ambulatory Visit | Attending: Cardiology | Admitting: Cardiology

## 2011-09-27 NOTE — Progress Notes (Signed)
While doing measurements post bike test, discovered open cut on pt's arm with some blood.  No profuse bleeding, wound wiped cleaned and dressed.  Pt does not remember hitting his arm on anything.  He said that he was fine to keep going. Fabio Pierce, MA, ACSM RCEP

## 2011-09-29 ENCOUNTER — Encounter (HOSPITAL_COMMUNITY)
Admission: RE | Admit: 2011-09-29 | Discharge: 2011-09-29 | Disposition: A | Discharge status: Home / Self Care | Payer: Medicare Other | Source: Ambulatory Visit | Attending: Cardiology | Admitting: Cardiology

## 2011-10-01 ENCOUNTER — Encounter (HOSPITAL_COMMUNITY)
Admission: RE | Admit: 2011-10-01 | Discharge: 2011-10-01 | Disposition: A | Discharge status: Home / Self Care | Payer: Medicare Other | Source: Ambulatory Visit | Attending: Cardiology | Admitting: Cardiology

## 2011-10-04 ENCOUNTER — Encounter (HOSPITAL_COMMUNITY)
Admission: RE | Admit: 2011-10-04 | Discharge: 2011-10-04 | Disposition: A | Discharge status: Home / Self Care | Payer: Medicare Other | Source: Ambulatory Visit | Attending: Cardiology | Admitting: Cardiology

## 2011-10-06 ENCOUNTER — Encounter (HOSPITAL_COMMUNITY)
Admission: RE | Admit: 2011-10-06 | Discharge: 2011-10-06 | Disposition: A | Discharge status: Home / Self Care | Payer: Medicare Other | Source: Ambulatory Visit | Attending: Cardiology | Admitting: Cardiology

## 2011-10-06 DIAGNOSIS — Z7982 Long term (current) use of aspirin: Secondary | ICD-10-CM | POA: Insufficient documentation

## 2011-10-06 DIAGNOSIS — I214 Non-ST elevation (NSTEMI) myocardial infarction: Secondary | ICD-10-CM | POA: Insufficient documentation

## 2011-10-06 DIAGNOSIS — Z5189 Encounter for other specified aftercare: Secondary | ICD-10-CM | POA: Insufficient documentation

## 2011-10-06 DIAGNOSIS — I10 Essential (primary) hypertension: Secondary | ICD-10-CM | POA: Insufficient documentation

## 2011-10-06 DIAGNOSIS — Z87891 Personal history of nicotine dependence: Secondary | ICD-10-CM | POA: Insufficient documentation

## 2011-10-06 DIAGNOSIS — Z9861 Coronary angioplasty status: Secondary | ICD-10-CM | POA: Insufficient documentation

## 2011-10-06 DIAGNOSIS — I2510 Atherosclerotic heart disease of native coronary artery without angina pectoris: Secondary | ICD-10-CM | POA: Insufficient documentation

## 2011-10-06 DIAGNOSIS — Z7901 Long term (current) use of anticoagulants: Secondary | ICD-10-CM | POA: Insufficient documentation

## 2011-10-06 DIAGNOSIS — F411 Generalized anxiety disorder: Secondary | ICD-10-CM | POA: Insufficient documentation

## 2011-10-06 DIAGNOSIS — E785 Hyperlipidemia, unspecified: Secondary | ICD-10-CM | POA: Insufficient documentation

## 2011-10-06 DIAGNOSIS — Z79899 Other long term (current) drug therapy: Secondary | ICD-10-CM | POA: Insufficient documentation

## 2011-10-08 ENCOUNTER — Encounter (HOSPITAL_COMMUNITY)
Admission: RE | Admit: 2011-10-08 | Discharge: 2011-10-08 | Disposition: A | Discharge status: Home / Self Care | Payer: Medicare Other | Source: Ambulatory Visit | Attending: Cardiology | Admitting: Cardiology

## 2011-10-11 ENCOUNTER — Encounter (HOSPITAL_COMMUNITY): Payer: Medicare Other

## 2011-10-13 ENCOUNTER — Encounter (HOSPITAL_COMMUNITY): Payer: Medicare Other

## 2011-10-15 ENCOUNTER — Encounter (HOSPITAL_COMMUNITY): Payer: Medicare Other

## 2011-10-18 ENCOUNTER — Encounter (HOSPITAL_COMMUNITY): Payer: Medicare Other

## 2011-10-20 ENCOUNTER — Encounter (HOSPITAL_COMMUNITY): Payer: Medicare Other

## 2011-10-22 ENCOUNTER — Encounter (HOSPITAL_COMMUNITY): Payer: Medicare Other

## 2011-11-30 ENCOUNTER — Ambulatory Visit: Payer: Medicare Other | Admitting: Cardiology

## 2012-02-23 ENCOUNTER — Other Ambulatory Visit: Payer: Self-pay | Admitting: Cardiology

## 2012-02-24 NOTE — Telephone Encounter (Signed)
Refilled metoprolol 

## 2012-08-27 ENCOUNTER — Other Ambulatory Visit: Payer: Self-pay | Admitting: Cardiology

## 2013-03-20 ENCOUNTER — Other Ambulatory Visit: Payer: Self-pay | Admitting: Cardiology

## 2013-05-22 ENCOUNTER — Ambulatory Visit (INDEPENDENT_AMBULATORY_CARE_PROVIDER_SITE_OTHER): Payer: Medicare Other | Admitting: Interventional Cardiology

## 2013-05-22 ENCOUNTER — Encounter (INDEPENDENT_AMBULATORY_CARE_PROVIDER_SITE_OTHER): Payer: Self-pay

## 2013-05-22 ENCOUNTER — Encounter: Payer: Self-pay | Admitting: Interventional Cardiology

## 2013-05-22 VITALS — BP 140/80 | HR 53 | Ht 62.0 in | Wt 143.0 lb

## 2013-05-22 DIAGNOSIS — I10 Essential (primary) hypertension: Secondary | ICD-10-CM

## 2013-05-22 DIAGNOSIS — E785 Hyperlipidemia, unspecified: Secondary | ICD-10-CM

## 2013-05-22 DIAGNOSIS — I2510 Atherosclerotic heart disease of native coronary artery without angina pectoris: Secondary | ICD-10-CM

## 2013-05-22 NOTE — Progress Notes (Signed)
Patient ID: Leon Morris, male   DOB: 1923/05/16, 79 y.o.   MRN: [id number]    [address] [place], [place]  [address] Phone: [phone] Fax:  [phone]  Date:  05/22/2013   ID:  HARVIN KONICEK, DOB 27-Aug-1922, MRN [id number]  PCP:  Darnelle Bos, MD      History of Present Illness: Leon Morris is a 79 y.o. male who had an MI in Jan 2013. It felt like an elephant sitting on his chest. He had pain in his arm and some heavy breathing. He had 3 stents to LAD and one stent to the RCA. He has been playing golf and working out 3-4x/wk. No sx like his heart attack. He has some mild orthopnea at night. He uses one pillow. He moves to the recliner and is more comfortable. No bleeding problems. Does bruise easily. No sx like his prior MI. Did not feeel well on Plavix. He has cut the dose in half and he feels b He had leg pain and he decreased crestor to 10 mg daily and he feels better. No bleeding in his GI tract. He was bruising easily on Brilinta in the past.    Wt Readings from Last 3 Encounters:  05/22/13 143 lb (64.864 kg)  08/04/11 143 lb (64.864 kg)  07/15/11 145 lb 11.6 oz (66.1 kg)     Past Medical History  Diagnosis Date  . Anxiety   . Hypercholesterolemia   . Hypertension 06/16/2011  . NSTEMI (non-ST elevated myocardial infarction)     06/16/2011   DR [place]   . Arthritis     LOWER BACK PAIN   . CAD (coronary artery disease)     Current Outpatient Prescriptions  Medication Sig Dispense Refill  . aspirin EC 81 MG EC tablet Take 1 tablet (81 mg total) by mouth daily.      . citalopram (CELEXA) 20 MG tablet Take 20 mg by mouth daily.       . clopidogrel (PLAVIX) 75 MG tablet TAKE ONE TABLET BY MOUTH ONE TIME DAILY  30 tablet  10  . metoprolol tartrate (LOPRESSOR) 25 MG tablet Take one tablet by mouth twice daily  60 tablet  1  . Naproxen Sodium (ALEVE PO) Take by mouth 2 (two) times daily.      . nitroGLYCERIN (NITROSTAT) 0.4 MG SL tablet  Place 1 tablet (0.4 mg total) under the tongue every 5 (five) minutes as needed for chest pain.  25 tablet  3  . rosuvastatin (CRESTOR) 20 MG tablet Take 1 tablet (20 mg total) by mouth daily at 6 PM.  30 tablet  6   No current facility-administered medications for this visit.   Facility-Administered Medications Ordered in Other Visits  Medication Dose Route Frequency Provider Last Rate Last Dose  . lactated ringers infusion    Continuous PRN Luster Landsberg, CRNA        Allergies:   No Known Allergies  Social History:  The patient  reports that he quit smoking about 72 years ago. He does not have any smokeless tobacco history on file. He reports that he does not drink alcohol or use illicit drugs.   Family History:  The patient's family history is not on file.   ROS:  Please see the history of present illness.  No nausea, vomiting.  No fevers, chills.  No focal weakness.  No dysuria. Left knee pain  All other systems reviewed and negative.   PHYSICAL  EXAM: VS:  BP 140/80  Pulse 53  Ht 5\' 2"  (1.575 m)  Wt 143 lb (64.864 kg)  BMI 26.15 kg/m2 Well nourished, well developed, in no acute distress HEENT: normal Neck: no JVD, no carotid bruits Cardiac:  normal S1, S2; RRR;  Lungs:  clear to auscultation bilaterally, no wheezing, rhonchi or rales Abd: soft, nontender, no hepatomegaly Ext: no edema Skin: warm and dry Neuro:   no focal abnormalities noted  EKG:    NSR, prolonged PR, no ST changes, inferior Q waves.   ASSESSMENT AND PLAN:  MI, Old  Stop Aspirin Tablet, 81 MG, 1 tablet, Orally, Once a day Continue Clopidogrel Bisulfate Tablet, 75 MG, 1 tablet, Orally, Once a day Continue Crestor Tablet, 10 MG, half tablet, Orally, Once a day Continue aggressive secondary prevention. Continue 75 mg daily of Plavix.  He wants to stop aspirin due to easy bruising.    2. Palpitation  Continue Metoprolol Tartrate Tablet, 25 MG, 1 tablet, Orally, Twice a day Mild, rare. If they get more  frequent, let us know and we can use an event monitor.    3. SOB   orthopnea resolved; elevates head of bed.  LV function normal in June 2013.    4. Combined hyperlipidemia  LDL was 59 prior to her MI in August. His Crestor dose has been decreased. He will need his lipids rechecked . Most recently, LDL 57.   LDL 71 in 8/14 on th elower dose of the crestor.  He has had muscle pains on higher doses.     Signed, Fredric Mare, MD, [facility] 05/22/2013 10:22 AM

## 2013-05-22 NOTE — Patient Instructions (Addendum)
Your physician has recommended you make the following change in your medication:   1. Stop Aspirin.  2. Continue all other medications.   Your physician wants you to follow-up in: 1 year with Dr. Eldridge Dace. You will receive a reminder letter in the mail two months in advance. If you don't receive a letter, please call our office to schedule the follow-up appointment.

## 2013-07-18 ENCOUNTER — Other Ambulatory Visit: Payer: Self-pay | Admitting: Cardiology

## 2013-08-05 ENCOUNTER — Emergency Department (HOSPITAL_COMMUNITY): Payer: Medicare Other

## 2013-08-05 ENCOUNTER — Encounter (HOSPITAL_COMMUNITY): Payer: Self-pay | Admitting: Emergency Medicine

## 2013-08-05 ENCOUNTER — Emergency Department (HOSPITAL_COMMUNITY)
Admission: EM | Admit: 2013-08-05 | Discharge: 2013-08-05 | Disposition: A | Discharge status: Home / Self Care | Payer: Medicare Other | Attending: Emergency Medicine | Admitting: Emergency Medicine

## 2013-08-05 DIAGNOSIS — E7800 Pure hypercholesterolemia, unspecified: Secondary | ICD-10-CM | POA: Insufficient documentation

## 2013-08-05 DIAGNOSIS — Z23 Encounter for immunization: Secondary | ICD-10-CM | POA: Insufficient documentation

## 2013-08-05 DIAGNOSIS — Z9889 Other specified postprocedural states: Secondary | ICD-10-CM | POA: Insufficient documentation

## 2013-08-05 DIAGNOSIS — IMO0002 Reserved for concepts with insufficient information to code with codable children: Secondary | ICD-10-CM | POA: Insufficient documentation

## 2013-08-05 DIAGNOSIS — I252 Old myocardial infarction: Secondary | ICD-10-CM | POA: Insufficient documentation

## 2013-08-05 DIAGNOSIS — S50819A Abrasion of unspecified forearm, initial encounter: Secondary | ICD-10-CM

## 2013-08-05 DIAGNOSIS — S4980XA Other specified injuries of shoulder and upper arm, unspecified arm, initial encounter: Secondary | ICD-10-CM | POA: Insufficient documentation

## 2013-08-05 DIAGNOSIS — Z9861 Coronary angioplasty status: Secondary | ICD-10-CM | POA: Insufficient documentation

## 2013-08-05 DIAGNOSIS — I2510 Atherosclerotic heart disease of native coronary artery without angina pectoris: Secondary | ICD-10-CM | POA: Insufficient documentation

## 2013-08-05 DIAGNOSIS — Z79899 Other long term (current) drug therapy: Secondary | ICD-10-CM | POA: Insufficient documentation

## 2013-08-05 DIAGNOSIS — S46909A Unspecified injury of unspecified muscle, fascia and tendon at shoulder and upper arm level, unspecified arm, initial encounter: Secondary | ICD-10-CM | POA: Insufficient documentation

## 2013-08-05 DIAGNOSIS — S0001XA Abrasion of scalp, initial encounter: Secondary | ICD-10-CM

## 2013-08-05 DIAGNOSIS — Z791 Long term (current) use of non-steroidal anti-inflammatories (NSAID): Secondary | ICD-10-CM | POA: Insufficient documentation

## 2013-08-05 DIAGNOSIS — I10 Essential (primary) hypertension: Secondary | ICD-10-CM | POA: Insufficient documentation

## 2013-08-05 DIAGNOSIS — Z9660 Presence of unspecified orthopedic joint implant: Secondary | ICD-10-CM | POA: Insufficient documentation

## 2013-08-05 DIAGNOSIS — Y9289 Other specified places as the place of occurrence of the external cause: Secondary | ICD-10-CM | POA: Insufficient documentation

## 2013-08-05 DIAGNOSIS — F411 Generalized anxiety disorder: Secondary | ICD-10-CM | POA: Insufficient documentation

## 2013-08-05 DIAGNOSIS — W108XXA Fall (on) (from) other stairs and steps, initial encounter: Secondary | ICD-10-CM | POA: Insufficient documentation

## 2013-08-05 DIAGNOSIS — Z7902 Long term (current) use of antithrombotics/antiplatelets: Secondary | ICD-10-CM | POA: Insufficient documentation

## 2013-08-05 DIAGNOSIS — M47817 Spondylosis without myelopathy or radiculopathy, lumbosacral region: Secondary | ICD-10-CM | POA: Insufficient documentation

## 2013-08-05 DIAGNOSIS — Z87891 Personal history of nicotine dependence: Secondary | ICD-10-CM | POA: Insufficient documentation

## 2013-08-05 DIAGNOSIS — W19XXXA Unspecified fall, initial encounter: Secondary | ICD-10-CM

## 2013-08-05 DIAGNOSIS — Y9301 Activity, walking, marching and hiking: Secondary | ICD-10-CM | POA: Insufficient documentation

## 2013-08-05 MED ORDER — TETANUS-DIPHTH-ACELL PERTUSSIS 5-2.5-18.5 LF-MCG/0.5 IM SUSP
0.5000 mL | Freq: Once | INTRAMUSCULAR | Status: AC
  Administered 2013-08-05: 0.5 mL via INTRAMUSCULAR
  Filled 2013-08-05: qty 0.5

## 2013-08-05 NOTE — ED Notes (Signed)
Patient returned from CT

## 2013-08-05 NOTE — ED Notes (Signed)
Pt reports walking down steps and slipped on ice. Has multiple skin tears to left forearm, also abrasion to top of head. C/o right shoulder and neck pain. Also taking plavix. Denies LOC. Bleeding controlled.

## 2013-08-05 NOTE — ED Provider Notes (Signed)
CSN: [id number]     Arrival date & time 08/05/13  1420 History   First MD Initiated Contact with Patient 08/05/13 1628     Chief Complaint  Patient presents with  . Fall     (Consider location/radiation/quality/duration/timing/severity/associated sxs/prior Treatment) Patient is a 80 y.o. male presenting with fall. The history is provided by the patient.  Fall This is a new problem. Pertinent negatives include no chest pain, no abdominal pain, no headaches and no shortness of breath.  patient with fall when he slipped on some stairs. CT was done around 3 status. No loss of consciousness. He has some pain in his right shoulder. No numbness or weakness. No loss of consciousness, however he did hit his head. He has abrasion to his left forearm. He is on Plavix for coronary artery disease.  Past Medical History  Diagnosis Date  . Anxiety   . Hypercholesterolemia   . Hypertension 06/16/2011  . NSTEMI (non-ST elevated myocardial infarction)     06/16/2011   DR [place]   . Arthritis     LOWER BACK PAIN   . CAD (coronary artery disease)    Past Surgical History  Procedure Laterality Date  . Knee surgery    . Shoulder surgery    . Hernia repair    . Joint replacement    . Cardiac catheterization    . Coronary angioplasty      06/2011   DR [place]   . Cataract extraction w/ intraocular lens  implant, bilateral    . False aneurysm repair  07/05/2011    Procedure: REPAIR FALSE ANEURYSM;  Surgeon: Larina Earthly, MD;  Location: [facility] OR;  Service: Vascular;  Laterality: Right;  repair false aneurysm right arm   Family History  Problem Relation Age of Onset  . Lung cancer Father   . CAD Father   . CAD Brother    History  Substance Use Topics  . Smoking status: Former Smoker    Quit date: 05/18/1941  . Smokeless tobacco: Not on file  . Alcohol Use: No    Review of Systems  Constitutional: Negative for activity change and appetite change.  Eyes: Negative for pain.  Respiratory: Negative for  chest tightness and shortness of breath.   Cardiovascular: Negative for chest pain and leg swelling.  Gastrointestinal: Negative for nausea, vomiting, abdominal pain and diarrhea.  Genitourinary: Negative for flank pain.  Musculoskeletal: Negative for back pain and neck stiffness.  Skin: Positive for wound. Negative for rash.  Neurological: Negative for weakness, numbness and headaches.  Psychiatric/Behavioral: Negative for behavioral problems.      Allergies  Review of patient's allergies indicates no known allergies.  Home Medications   Current Outpatient Rx  Name  Route  Sig  Dispense  Refill  . citalopram (CELEXA) 20 MG tablet   Oral   Take 20 mg by mouth daily.          . clopidogrel (PLAVIX) 75 MG tablet   Oral   Take 37.5 mg by mouth daily with breakfast.         . metoprolol tartrate (LOPRESSOR) 25 MG tablet   Oral   Take 25 mg by mouth daily.         . Naproxen Sodium (ALEVE PO)   Oral   Take by mouth 2 (two) times daily.         . rosuvastatin (CRESTOR) 20 MG tablet   Oral   Take 10 mg by mouth daily.         Marland Kitchen  EXPIRED: nitroGLYCERIN (NITROSTAT) 0.4 MG SL tablet   Sublingual   Place 1 tablet (0.4 mg total) under the tongue every 5 (five) minutes as needed for chest pain.   25 tablet   3   . EXPIRED: rosuvastatin (CRESTOR) 20 MG tablet   Oral   Take 10 mg by mouth daily at 6 PM.          BP 155/73  Pulse 56  Temp(Src) 98.7 F (37.1 C) (Oral)  Resp 18  SpO2 100% Physical Exam  Constitutional: He is oriented to person, place, and time. He appears well-developed and well-nourished.  HENT:  Abrasion to occipital scalp area. No crepitance or deformity.  Eyes: EOM are normal. Pupils are equal, round, and reactive to light.  Neck: Normal range of motion. Neck supple.  Cardiovascular: Normal rate and regular rhythm.   Pulmonary/Chest: Effort normal and breath sounds normal.  Abdominal: Soft.  Musculoskeletal: He exhibits tenderness.  Mild  tenderness over right trapezius lateral to spine superiorly. No midline tenderness. Range of motion intact of cervical spine. No ecchymosis. There is skin tears 2 medial left forearm, without underlying bony tenderness.  Neurological: He is alert and oriented to person, place, and time.  Skin: Skin is warm.    ED Course  Procedures (including critical care time) Labs Review Labs Reviewed - No data to display Imaging Review Ct Head Wo Contrast  08/05/2013   CLINICAL DATA:  Fall.  Anticoagulated patient on Plavix.  EXAM: CT HEAD WITHOUT CONTRAST  TECHNIQUE: Contiguous axial images were obtained from the base of the skull through the vertex without intravenous contrast.  COMPARISON:  None.  FINDINGS: No mass lesion, mass effect, midline shift, hydrocephalus, hemorrhage. No acute territorial cortical ischemia/infarct. Atrophy and chronic ischemic white matter disease is present. Intracranial atherosclerosis is present. Paranasal sinuses are within normal limits.  IMPRESSION: Atrophy and chronic ischemic white matter disease without acute intracranial abnormality.   Electronically Signed   By: Andreas NewportGeoffrey  Lamke M.D.   On: 08/05/2013 17:08     EKG Interpretation None      MDM   Final diagnoses:  Fall  Forearm abrasion  Scalp abrasion    Patient with fall. Cervical spine cleared clinically. Head CT done due to Plavix use an abrasion on head. No bleeding. Patient was given instructions what to watch for for delayed bleed. Also former abrasions, but that does not appear to be bony tenderness and does not appear to be able to be sutured.   Juliet RudeNathan R. Rubin PayorPickering, MD 08/05/13 1751

## 2013-08-05 NOTE — ED Notes (Signed)
[place] c-collar applied to patient.

## 2013-08-05 NOTE — Discharge Instructions (Signed)
Open Wound, Forearm An open wound is a break in the skin because of an injury. An open wound can be a scrape, cut, or puncture to the skin. Good wound care will help to:   Reduce pain.  Prevent infection.  Reduce scaring. HOME CARE  Rest and raise (elevate) the injured arm on a pillow. This will help keep the puffiness (swelling) down.  Wash all dirt off the wound.  Clean the wounds daily with gentle soap and water.  Apply medicated cream after the wound has been cleaned or as told by your doctor.  Apply a clean bandage (dressing) daily if necessary. GET HELP RIGHT AWAY IF:   There is increased redness or puffiness in or around the wound.  Your pain increases.  A yellowish white fluid (pus) comes from the wound.  Your pain is not controlled with pain relievers.  You cannot move or feel your fingers.  There is red streaking of the skin that goes above or below the wound. MAKE SURE YOU:   Understand these instructions.  Will watch your condition.  Will get help right away if you are not doing well or get worse. Document Released: 08/20/2008 Document Revised: 08/16/2011 Document Reviewed: 08/20/2008 [facility] Patient Information 2014 [place], [place].

## 2013-08-05 NOTE — ED Notes (Signed)
Patient transported to CT 

## 2013-08-05 NOTE — ED Notes (Signed)
Abrasions to left arm and top of head cleaned, bacitracin applied, and arm wrapped in guaze.

## 2013-09-03 ENCOUNTER — Other Ambulatory Visit: Payer: Self-pay | Admitting: *Deleted

## 2013-09-03 MED ORDER — NITROGLYCERIN 0.4 MG SL SUBL: 0.4000 mg | SUBLINGUAL_TABLET | SUBLINGUAL | Status: DC | PRN

## 2013-09-06 ENCOUNTER — Other Ambulatory Visit: Payer: Self-pay

## 2013-09-06 MED ORDER — CLOPIDOGREL BISULFATE 75 MG PO TABS: 37.5000 mg | ORAL_TABLET | Freq: Every day | ORAL | Status: DC

## 2013-10-02 ENCOUNTER — Telehealth: Payer: Self-pay | Admitting: Interventional Cardiology

## 2013-10-02 NOTE — Telephone Encounter (Signed)
Awaiting Dr. Hoyle BarrVaranasi's signature.

## 2013-10-02 NOTE — Telephone Encounter (Signed)
New message    Fax request sent on  4/14, 4/21,   Procedure on 5/1 .   Extraction of 2 teeth with local anest

## 2013-10-04 NOTE — Telephone Encounter (Signed)
OK for tooth extraction procedure. I would like him to stay on either the aspirin or the clopidogrel. Staying on just aspirin would likely reduce his bleeding risk. If we stop both, he is at higher risk for stent thrombosis. I can speak with whoever the dentist is who will be performing the procedure to further assess risk.

## 2013-10-04 NOTE — Telephone Encounter (Signed)
To Dr. Varanasi, please advise.  

## 2013-10-08 NOTE — Telephone Encounter (Signed)
Faxed to Dr. Shea EvansBelcher.

## 2014-05-16 ENCOUNTER — Encounter (HOSPITAL_COMMUNITY): Payer: Self-pay | Admitting: Cardiology

## 2014-05-23 ENCOUNTER — Ambulatory Visit (INDEPENDENT_AMBULATORY_CARE_PROVIDER_SITE_OTHER): Payer: Medicare Other | Admitting: Interventional Cardiology

## 2014-05-23 ENCOUNTER — Encounter: Payer: Self-pay | Admitting: Interventional Cardiology

## 2014-05-23 VITALS — BP 160/92 | HR 51 | Ht 62.0 in | Wt 141.0 lb

## 2014-05-23 DIAGNOSIS — I214 Non-ST elevation (NSTEMI) myocardial infarction: Secondary | ICD-10-CM

## 2014-05-23 DIAGNOSIS — E785 Hyperlipidemia, unspecified: Secondary | ICD-10-CM

## 2014-05-23 DIAGNOSIS — I2510 Atherosclerotic heart disease of native coronary artery without angina pectoris: Secondary | ICD-10-CM

## 2014-05-23 NOTE — Patient Instructions (Signed)
Your physician recommends that you continue on your current medications as directed. Please refer to the Current Medication list given to you today.  Your physician wants you to follow-up in: 1 year with Dr. Varanasi. You will receive a reminder letter in the mail two months in advance. If you don't receive a letter, please call our office to schedule the follow-up appointment.  

## 2014-05-23 NOTE — Progress Notes (Signed)
Patient ID: Leon Morris, male   DOB: 04-29-1923, 80 y.o.   MRN: [id number] Patient ID: Leon Morris, male   DOB: 04-29-1923, 80 y.o.   MRN: [id number]    [address] [place], [place]  [address] Phone: [phone] Fax:  [phone]  Date:  05/23/2014   ID:  Leon Morris, DOB 04-29-1923, MRN [id number]  PCP:  Darnelle BosSBORNE,JAMES CHARLES, MD      History of Present Illness: Leon Morris is a 80 y.o. male who had an MI in Jan 2013. It felt like an elephant sitting on his chest. He had pain in his arm and some heavy breathing. He had 3 stents to LAD and one stent to the RCA. He has been playing golf and working out 6x/wk. No sx like his heart attack. He has some mild orthopnea at night. He uses one pillow. He moves to the recliner and is more comfortable. No bleeding problems. Does bruise easily. No sx like his prior MI.  He has not been taking Crestor for the past month.  He was getting some samples at times.  Tolerates the 5 mg daily dosage. Not using Aleve very much.  BP has been in the 130s systolic range of late.    Wt Readings from Last 3 Encounters:  05/23/14 141 lb (63.957 kg)  05/22/13 143 lb (64.864 kg)  08/04/11 143 lb (64.864 kg)     Past Medical History  Diagnosis Date  . Anxiety   . Hypercholesterolemia   . Hypertension 06/16/2011  . NSTEMI (non-ST elevated myocardial infarction)     06/16/2011   DR [place]   . Arthritis     LOWER BACK PAIN   . CAD (coronary artery disease)     Current Outpatient Prescriptions  Medication Sig Dispense Refill  . citalopram (CELEXA) 20 MG tablet Take 20 mg by mouth daily.     . clopidogrel (PLAVIX) 75 MG tablet Take 0.5 tablets (37.5 mg total) by mouth daily with breakfast. 30 tablet 6  . metoprolol tartrate (LOPRESSOR) 25 MG tablet Take 25 mg by mouth daily.    . Naproxen Sodium (ALEVE PO) Take 1 capsule by mouth 2 (two) times daily as needed (as needed for joint pain).     . nitroGLYCERIN (NITROSTAT) 0.4 MG  SL tablet Place 1 tablet (0.4 mg total) under the tongue every 5 (five) minutes as needed for chest pain. 25 tablet 3  . rosuvastatin (CRESTOR) 5 MG tablet Take 5 mg by mouth daily.     No current facility-administered medications for this visit.   Facility-Administered Medications Ordered in Other Visits  Medication Dose Route Frequency Provider Last Rate Last Dose  . lactated ringers infusion    Continuous PRN Luster Landsbergonja R Chase, CRNA        Allergies:   No Known Allergies  Social History:  The patient  reports that he quit smoking about 73 years ago. He does not have any smokeless tobacco history on file. He reports that he does not drink alcohol or use illicit drugs.   Family History:  The patient's family history includes CAD in his brother and father; Lung cancer in his father.   ROS:  Please see the history of present illness.  No nausea, vomiting.  No fevers, chills.  No focal weakness.  No dysuria. Left knee pain  All other systems reviewed and negative.   PHYSICAL EXAM: VS:  BP 160/92 mmHg  Pulse 51  Ht 5\' 2"  (  1.575 m)  Wt 141 lb (63.957 kg)  BMI 25.78 kg/m2 Well nourished, well developed, in no acute distress HEENT: normal Neck: no JVD, no carotid bruits Cardiac:  normal S1, S2; RRR;  Lungs:  clear to auscultation bilaterally, no wheezing, rhonchi or rales Abd: soft, nontender, no hepatomegaly Ext: no edema Skin: warm and dry Neuro:   no focal abnormalities noted Psych: normal affect  EKG:    NSR, prolonged PR, no ST changes, inferior Q waves.   ASSESSMENT AND PLAN:  MI, Old/CAD  Off Aspirin Tablet, 81 MG, 1 tablet, Orally, Once a day Continue Clopidogrel Bisulfate Tablet, 75 MG, 1 tablet, Orally, Once a day Continue Crestor Tablet, 10 MG, half tablet, Orally, Once a day Continue aggressive secondary prevention. Continue 75 mg daily of Plavix.  He stopped aspirin due to easy bruising. Le since that time.ss bruising   2. Palpitation  Continue Metoprolol Tartrate  Tablet, 25 MG, 1 tablet, Orally, Twice a day Mild, rare. If they get more frequent, let us know and we can use an event monitor.      3. Combined hyperlipidemia  LDL was 59 prior to her MI in August 2013. His Crestor dose has been decreased to 5mg  daily. Most recently, LDL 57.   LDL 71 in 8/14 on thelower dose of the crestor.  He has had muscle pains on higher doses.  Will obtain most recent blood tests.   4. HTN: Follow BP. BP much better at home.  WIll not add more meds at this time.   Signed, Fredric MareJay S. Varanasi, MD, [facility] 05/23/2014 1:28 PM

## 2014-06-20 ENCOUNTER — Encounter (HOSPITAL_COMMUNITY): Payer: Self-pay | Admitting: Vascular Surgery

## 2014-06-30 ENCOUNTER — Other Ambulatory Visit: Payer: Self-pay | Admitting: Interventional Cardiology

## 2014-07-01 NOTE — Telephone Encounter (Signed)
Metoprolol tartrate is typically BID dosing. The patient's medication list does state that he is on metoprolol tartrate once daily at his last office visit, but Dr. Eldridge DaceVaranasi states he should continue at BID dosing. Will forward to Dr. Eldridge DaceVaranasi to clarify exactly what the patient should be taking.

## 2014-07-01 NOTE — Telephone Encounter (Signed)
Should this be qd or bid? Please advise. Thanks, MI 

## 2014-07-02 NOTE — Telephone Encounter (Signed)
Would have him take it BID.

## 2014-07-04 NOTE — Telephone Encounter (Signed)
Mindy, please refill for BID dosing. Thank you!

## 2014-07-04 NOTE — Telephone Encounter (Signed)
Never mind Mindy- I just realized you already did this. Thank you!

## 2014-09-11 ENCOUNTER — Other Ambulatory Visit: Payer: Self-pay

## 2014-09-11 MED ORDER — CLOPIDOGREL BISULFATE 75 MG PO TABS: 37.5000 mg | ORAL_TABLET | Freq: Every day | ORAL | Status: DC

## 2015-02-28 ENCOUNTER — Encounter: Payer: Self-pay | Admitting: Interventional Cardiology

## 2015-06-26 ENCOUNTER — Ambulatory Visit: Payer: Medicare Other | Admitting: Interventional Cardiology

## 2015-07-04 ENCOUNTER — Other Ambulatory Visit: Payer: Self-pay | Admitting: Interventional Cardiology

## 2015-07-26 ENCOUNTER — Other Ambulatory Visit: Payer: Self-pay | Admitting: Interventional Cardiology

## 2015-08-20 ENCOUNTER — Ambulatory Visit: Payer: Self-pay | Admitting: Interventional Cardiology

## 2015-08-22 ENCOUNTER — Encounter: Payer: Self-pay | Admitting: Interventional Cardiology

## 2015-09-04 ENCOUNTER — Other Ambulatory Visit: Payer: Self-pay | Admitting: Interventional Cardiology

## 2015-10-04 ENCOUNTER — Other Ambulatory Visit: Payer: Self-pay | Admitting: Interventional Cardiology

## 2015-10-29 ENCOUNTER — Encounter: Payer: Self-pay | Admitting: Interventional Cardiology

## 2015-10-29 ENCOUNTER — Ambulatory Visit (INDEPENDENT_AMBULATORY_CARE_PROVIDER_SITE_OTHER): Payer: Medicare Other | Admitting: Interventional Cardiology

## 2015-10-29 VITALS — BP 150/70 | HR 62 | Ht 62.0 in | Wt 142.0 lb

## 2015-10-29 DIAGNOSIS — I10 Essential (primary) hypertension: Secondary | ICD-10-CM | POA: Diagnosis not present

## 2015-10-29 DIAGNOSIS — E785 Hyperlipidemia, unspecified: Secondary | ICD-10-CM

## 2015-10-29 DIAGNOSIS — I214 Non-ST elevation (NSTEMI) myocardial infarction: Secondary | ICD-10-CM

## 2015-10-29 DIAGNOSIS — I252 Old myocardial infarction: Secondary | ICD-10-CM

## 2015-10-29 DIAGNOSIS — I2510 Atherosclerotic heart disease of native coronary artery without angina pectoris: Secondary | ICD-10-CM | POA: Diagnosis not present

## 2015-10-29 NOTE — Progress Notes (Signed)
Cardiology Office Note   Date:  10/29/2015   ID:  Leon Lindenrnest J Roselli, DOB [birth year], MRN [id number]  PCP:  Pearla DubonnetGATES,ROBERT NEVILL, MD    No chief complaint on file. f/u CAD   Wt Readings from Last 3 Encounters:  10/29/15 142 lb (64.411 kg)  05/23/14 141 lb (63.957 kg)  05/22/13 143 lb (64.864 kg)       History of Present Illness: Leon Morris is a 82 y.o. male  who had an MI in Jan 2013. It felt like an elephant sitting on his chest. He had pain in his arm and some heavy breathing. He had 3 stents to LAD and one stent to the RCA. He has been playing golf and working out 6x/wk. No sx like his heart attack.  No bleeding problems. Does bruise easily. No sx like his prior MI.  No more SHOB. Feels very well.  Is often told that he does not look 82 years old.   Exercises on a bike at the gym 6 days a week.  Plays golf once a week. He plays 9 holes.      Past Medical History  Diagnosis Date  . Anxiety   . Hypercholesterolemia   . Hypertension 06/16/2011  . NSTEMI (non-ST elevated myocardial infarction) (HCC)     06/16/2011   DR [place]   . Arthritis     LOWER BACK PAIN   . CAD (coronary artery disease)     Past Surgical History  Procedure Laterality Date  . Knee surgery    . Shoulder surgery    . Hernia repair    . Joint replacement    . Cardiac catheterization    . Coronary angioplasty      06/2011   DR [place]   . Cataract extraction w/ intraocular lens  implant, bilateral    . Left heart catheterization with coronary angiogram N/A 06/13/2011    Procedure: LEFT HEART CATHETERIZATION WITH CORONARY ANGIOGRAM;  Surgeon: Peter M Sw[place]Jordan, MD;  [facility];  Service: Cardiovascular;  Laterality: N/A;     Current Outpatient Prescriptions  Medication Sig Dispense Refill  . citalopram (CELEXA) 20 MG tablet Take 20 mg by mouth daily.     . clopidogrel (PLAVIX) 75 MG tablet TAKE 1/2 TABLET BY MOUTH EVERY DAY WITH BREAKFAST 30 tablet 8  . metoprolol tartrate  (LOPRESSOR) 25 MG tablet TAKE 1 TABLET BY MOUTH TWICE A DAY 60 tablet 1  . Naproxen Sodium (ALEVE PO) Take 1 capsule by mouth 2 (two) times daily as needed (as needed for joint pain).     . nitroGLYCERIN (NITROSTAT) 0.4 MG SL tablet Place 0.4 mg under the tongue every 5 (five) minutes as needed for chest pain (UP TO 3 DOSES BEFORE CALLING 911).    . rosuvastatin (CRESTOR) 5 MG tablet Take 5 mg by mouth daily.     No current facility-administered medications for this visit.   Facility-Administered Medications Ordered in Other Visits  Medication Dose Route Frequency Provider Last Rate Last Dose  . lactated ringers infusion    Continuous PRN Luster Landsbergonja R Chase, CRNA        Allergies:   Review of patient's allergies indicates no known allergies.    Social History:  The patient  reports that he quit smoking about 74 years ago. He does not have any smokeless tobacco history on file. He reports that he does not drink alcohol or use illicit drugs.   Family History:  The patient's family history  includes CAD in his brother and father; Lung cancer in his father. There is no history of Heart attack, Hypertension, or Stroke.    ROS:  Please see the history of present illness.   Otherwise, review of systems are positive for right shoulder pain.   All other systems are reviewed and negative.    PHYSICAL EXAM: VS:  BP 150/70 mmHg  Pulse 62  Ht  (1.575 m)  Wt 142 lb (64.411 kg)  BMI 25.97 kg/m2 , BMI Body mass index is 25.97 kg/(m^2). GEN: Well nourished, well developed, in no acute distress HEENT: normal Neck: no JVD, carotid bruits, or masses Cardiac: RRR; no murmurs, rubs, or gallops, tr LE edema  Respiratory:  clear to auscultation bilaterally, normal work of breathing GI: soft, nontender, nondistended, + BS MS: no deformity or atrophy Skin: warm and dry, no rash Neuro:  Strength and sensation are intact Psych: euthymic mood, full affect   EKG:   The ekg ordered today demonstrates NSR,  prolonged PR, no ST segment changes   Recent Labs: No results found for requested labs within last 365 days.   Lipid Panel    Component Value Date/Time   CHOL 84 08/04/2011 0914   TRIG 70.0 08/04/2011 0914   HDL 33.50* 08/04/2011 0914   CHOLHDL 3 08/04/2011 0914   VLDL 14.0 08/04/2011 0914   LDLCALC 37 08/04/2011 0914     Other studies Reviewed: Additional studies/ records that were reviewed today with results demonstrating: cath records.   ASSESSMENT AND PLAN:  1. CAD/ Old MI:  Continue Clopidogrel Bisulfate Tablet, 75 MG, 1 tablet, Orally, Once a day Continue Crestor Tablet, 10 MG, half tablet, Orally, Once a day Continue aggressive secondary prevention. Continue 75 mg daily of Plavix. He stopped aspirin due to easy bruising.   Hyperlipidemia: Crestor dose has been decreased to 5 mg daily.  He has muscle pains on higher doses. 2.  3. Elevated BP: BP usually better at home.  Continue current meds.   Current medicines are reviewed at length with the patient today.  The patient concerns regarding his medicines were addressed.  The following changes have been made:  No change  Labs/ tests ordered today include:   Orders Placed This Encounter  Procedures  . EKG 12-Lead    Recommend 150 minutes/week of aerobic exercise Low fat, low carb, high fiber diet recommended  Disposition:   FU in 1 year   Signed, Lance Muss, MD  10/29/2015 2:26 PM    [facility] [address] [place], [place], [place]  [address] Phone: [phone]; Fax: [phone]

## 2015-10-29 NOTE — Patient Instructions (Signed)
**Note De-identified  Obfuscation** Medication Instructions:  Same-no changes  Labwork: None  Testing/Procedures: None  Follow-Up: Your physician wants you to follow-up in: 1 year. You will receive a reminder letter in the mail two months in advance. If you don't receive a letter, please call our office to schedule the follow-up appointment.      If you need a refill on your cardiac medications before your next appointment, please call your pharmacy.   

## 2015-11-13 ENCOUNTER — Emergency Department (HOSPITAL_COMMUNITY): Payer: Medicare Other

## 2015-11-13 ENCOUNTER — Encounter (HOSPITAL_COMMUNITY): Payer: Self-pay | Admitting: Emergency Medicine

## 2015-11-13 ENCOUNTER — Inpatient Hospital Stay (HOSPITAL_COMMUNITY)
Admission: EM | Admit: 2015-11-13 | Discharge: 2015-11-15 | DRG: 536 | Disposition: A | Discharge status: Skilled Nursing Facility | Payer: Medicare Other | Attending: Internal Medicine | Admitting: Internal Medicine

## 2015-11-13 DIAGNOSIS — I252 Old myocardial infarction: Secondary | ICD-10-CM

## 2015-11-13 DIAGNOSIS — S329XXA Fracture of unspecified parts of lumbosacral spine and pelvis, initial encounter for closed fracture: Secondary | ICD-10-CM | POA: Diagnosis present

## 2015-11-13 DIAGNOSIS — I2510 Atherosclerotic heart disease of native coronary artery without angina pectoris: Secondary | ICD-10-CM | POA: Diagnosis present

## 2015-11-13 DIAGNOSIS — E785 Hyperlipidemia, unspecified: Secondary | ICD-10-CM | POA: Diagnosis present

## 2015-11-13 DIAGNOSIS — M1990 Unspecified osteoarthritis, unspecified site: Secondary | ICD-10-CM | POA: Diagnosis present

## 2015-11-13 DIAGNOSIS — I10 Essential (primary) hypertension: Secondary | ICD-10-CM | POA: Diagnosis present

## 2015-11-13 DIAGNOSIS — N183 Chronic kidney disease, stage 3 (moderate): Secondary | ICD-10-CM | POA: Diagnosis present

## 2015-11-13 DIAGNOSIS — E860 Dehydration: Secondary | ICD-10-CM | POA: Diagnosis present

## 2015-11-13 DIAGNOSIS — I214 Non-ST elevation (NSTEMI) myocardial infarction: Secondary | ICD-10-CM | POA: Diagnosis present

## 2015-11-13 DIAGNOSIS — Z87891 Personal history of nicotine dependence: Secondary | ICD-10-CM | POA: Diagnosis not present

## 2015-11-13 DIAGNOSIS — S32461A Displaced associated transverse-posterior fracture of right acetabulum, initial encounter for closed fracture: Secondary | ICD-10-CM | POA: Diagnosis present

## 2015-11-13 DIAGNOSIS — W19XXXA Unspecified fall, initial encounter: Secondary | ICD-10-CM

## 2015-11-13 DIAGNOSIS — W1830XA Fall on same level, unspecified, initial encounter: Secondary | ICD-10-CM | POA: Diagnosis present

## 2015-11-13 DIAGNOSIS — I129 Hypertensive chronic kidney disease with stage 1 through stage 4 chronic kidney disease, or unspecified chronic kidney disease: Secondary | ICD-10-CM | POA: Diagnosis present

## 2015-11-13 DIAGNOSIS — Z79899 Other long term (current) drug therapy: Secondary | ICD-10-CM | POA: Diagnosis not present

## 2015-11-13 DIAGNOSIS — N179 Acute kidney failure, unspecified: Secondary | ICD-10-CM | POA: Diagnosis present

## 2015-11-13 DIAGNOSIS — F419 Anxiety disorder, unspecified: Secondary | ICD-10-CM | POA: Diagnosis present

## 2015-11-13 DIAGNOSIS — Z7902 Long term (current) use of antithrombotics/antiplatelets: Secondary | ICD-10-CM | POA: Diagnosis not present

## 2015-11-13 DIAGNOSIS — Z8249 Family history of ischemic heart disease and other diseases of the circulatory system: Secondary | ICD-10-CM

## 2015-11-13 DIAGNOSIS — Z801 Family history of malignant neoplasm of trachea, bronchus and lung: Secondary | ICD-10-CM | POA: Diagnosis not present

## 2015-11-13 DIAGNOSIS — E7800 Pure hypercholesterolemia, unspecified: Secondary | ICD-10-CM | POA: Diagnosis present

## 2015-11-13 DIAGNOSIS — S32409A Unspecified fracture of unspecified acetabulum, initial encounter for closed fracture: Secondary | ICD-10-CM | POA: Diagnosis present

## 2015-11-13 LAB — CBC WITH DIFFERENTIAL/PLATELET
BASOS ABS: 0 10*3/uL (ref 0.0–0.1)
Basophils Relative: 0 %
EOS PCT: 1 %
Eosinophils Absolute: 0.1 10*3/uL (ref 0.0–0.7)
HCT: 37.7 % — ABNORMAL LOW (ref 39.0–52.0)
Hemoglobin: 13 g/dL (ref 13.0–17.0)
LYMPHS PCT: 8 %
Lymphs Abs: 1.2 10*3/uL (ref 0.7–4.0)
MCH: 32.1 pg (ref 26.0–34.0)
MCHC: 34.5 g/dL (ref 30.0–36.0)
MCV: 93.1 fL (ref 78.0–100.0)
Monocytes Absolute: 1.1 10*3/uL — ABNORMAL HIGH (ref 0.1–1.0)
Monocytes Relative: 7 %
NEUTROS PCT: 84 %
Neutro Abs: 13.4 10*3/uL — ABNORMAL HIGH (ref 1.7–7.7)
PLATELETS: 302 10*3/uL (ref 150–400)
RBC: 4.05 MIL/uL — AB (ref 4.22–5.81)
RDW: 13.6 % (ref 11.5–15.5)
WBC: 15.8 10*3/uL — AB (ref 4.0–10.5)

## 2015-11-13 LAB — URINALYSIS, ROUTINE W REFLEX MICROSCOPIC
Bilirubin Urine: NEGATIVE
Glucose, UA: NEGATIVE mg/dL
Hgb urine dipstick: NEGATIVE
Ketones, ur: NEGATIVE mg/dL
LEUKOCYTES UA: NEGATIVE
NITRITE: NEGATIVE
PROTEIN: NEGATIVE mg/dL
SPECIFIC GRAVITY, URINE: 1.026 (ref 1.005–1.030)
pH: 6 (ref 5.0–8.0)

## 2015-11-13 LAB — BASIC METABOLIC PANEL
ANION GAP: 5 (ref 5–15)
BUN: 33 mg/dL — AB (ref 6–20)
CO2: 28 mmol/L (ref 22–32)
Calcium: 8.7 mg/dL — ABNORMAL LOW (ref 8.9–10.3)
Chloride: 103 mmol/L (ref 101–111)
Creatinine, Ser: 1.97 mg/dL — ABNORMAL HIGH (ref 0.61–1.24)
GFR, EST AFRICAN AMERICAN: 33 mL/min — AB (ref >60)
GFR, EST NON AFRICAN AMERICAN: 28 mL/min — AB (ref >60)
Glucose, Bld: 116 mg/dL — ABNORMAL HIGH (ref 65–99)
POTASSIUM: 4.6 mmol/L (ref 3.5–5.1)
SODIUM: 136 mmol/L (ref 135–145)

## 2015-11-13 LAB — TYPE AND SCREEN
ABO/RH(D): O POS
Antibody Screen: NEGATIVE

## 2015-11-13 LAB — PROTIME-INR
INR: 1.1 (ref 0.00–1.49)
Prothrombin Time: 14.4 seconds (ref 11.6–15.2)

## 2015-11-13 MED ORDER — MORPHINE SULFATE (PF) 2 MG/ML IV SOLN
1.0000 mg | INTRAVENOUS | Status: DC | PRN
  Administered 2015-11-14: 1 mg via INTRAVENOUS
  Filled 2015-11-13: qty 1

## 2015-11-13 MED ORDER — ACETAMINOPHEN 500 MG PO TABS
1000.0000 mg | ORAL_TABLET | Freq: Once | ORAL | Status: AC
  Administered 2015-11-13: 1000 mg via ORAL
  Filled 2015-11-13: qty 2

## 2015-11-13 MED ORDER — SODIUM CHLORIDE 0.9 % IV BOLUS (SEPSIS)
500.0000 mL | Freq: Once | INTRAVENOUS | Status: AC
  Administered 2015-11-13: 500 mL via INTRAVENOUS

## 2015-11-13 MED ORDER — MORPHINE SULFATE (PF) 2 MG/ML IV SOLN
2.0000 mg | Freq: Once | INTRAVENOUS | Status: AC
  Administered 2015-11-13: 2 mg via INTRAVENOUS
  Filled 2015-11-13: qty 1

## 2015-11-13 MED ORDER — ACETAMINOPHEN 325 MG PO TABS
650.0000 mg | ORAL_TABLET | Freq: Four times a day (QID) | ORAL | Status: DC | PRN
  Administered 2015-11-14: 650 mg via ORAL
  Filled 2015-11-13: qty 2

## 2015-11-13 MED ORDER — ONDANSETRON HCL 4 MG/2ML IJ SOLN
4.0000 mg | Freq: Four times a day (QID) | INTRAMUSCULAR | Status: DC | PRN
  Administered 2015-11-15: 4 mg via INTRAVENOUS
  Filled 2015-11-13 (×2): qty 2

## 2015-11-13 MED ORDER — METOPROLOL TARTRATE 25 MG PO TABS
25.0000 mg | ORAL_TABLET | Freq: Two times a day (BID) | ORAL | Status: DC
  Administered 2015-11-13 – 2015-11-15 (×4): 25 mg via ORAL
  Filled 2015-11-13 (×4): qty 1

## 2015-11-13 MED ORDER — SENNOSIDES-DOCUSATE SODIUM 8.6-50 MG PO TABS: 1.0000 | ORAL_TABLET | Freq: Every evening | ORAL | Status: DC | PRN

## 2015-11-13 MED ORDER — ACETAMINOPHEN 650 MG RE SUPP: 650.0000 mg | Freq: Four times a day (QID) | RECTAL | Status: DC | PRN

## 2015-11-13 MED ORDER — ONDANSETRON HCL 4 MG PO TABS: 4.0000 mg | ORAL_TABLET | Freq: Four times a day (QID) | ORAL | Status: DC | PRN

## 2015-11-13 MED ORDER — OXYCODONE HCL 5 MG PO TABS
5.0000 mg | ORAL_TABLET | ORAL | Status: DC | PRN
  Administered 2015-11-13 – 2015-11-15 (×6): 5 mg via ORAL
  Filled 2015-11-13 (×6): qty 1

## 2015-11-13 MED ORDER — ONDANSETRON HCL 4 MG/2ML IJ SOLN
4.0000 mg | Freq: Once | INTRAMUSCULAR | Status: AC
  Administered 2015-11-13: 4 mg via INTRAVENOUS
  Filled 2015-11-13: qty 2

## 2015-11-13 MED ORDER — SODIUM CHLORIDE 0.9 % IV SOLN
INTRAVENOUS | Status: DC
  Administered 2015-11-13: 75 mL/h via INTRAVENOUS
  Administered 2015-11-14 – 2015-11-15 (×2): via INTRAVENOUS

## 2015-11-13 MED ORDER — CITALOPRAM HYDROBROMIDE 20 MG PO TABS
20.0000 mg | ORAL_TABLET | Freq: Every day | ORAL | Status: DC
  Administered 2015-11-14 – 2015-11-15 (×2): 20 mg via ORAL
  Filled 2015-11-13 (×2): qty 1

## 2015-11-13 MED ORDER — ROSUVASTATIN CALCIUM 5 MG PO TABS
5.0000 mg | ORAL_TABLET | Freq: Every day | ORAL | Status: DC
  Administered 2015-11-14 – 2015-11-15 (×2): 5 mg via ORAL
  Filled 2015-11-13 (×2): qty 1

## 2015-11-13 NOTE — ED Notes (Addendum)
Pt fell while working in the yard. Fell onto R hip, unable to bear weight. Hip appears shortened and rotated. Also has generalized abrasions

## 2015-11-13 NOTE — H&P (Signed)
History and Physical  Leon Morris [facility] WUJ:[id number] DOB: 02-10-1923 DOA: 11/13/2015   PCP: Pearla Dubonnet, MD   Patient coming from: Home   Chief Complaint: R hip pain   HPI: Leon Morris is a 82 y.o. male with medical history significant for MI in the past who is being admitted with right acetabular pelvic fracture. He was outside this PM edging his yard when he fell off the curb, backwards onto the street. He immediately had pain in the right hip and was unable to ambulate. Denies loss of consciousness, chest pain, palpitations. No recent fevers, chills, nausea, vomiting. No new medications or changes in medication dosing recently. He has been under quite a bit of stress lately and while he has been eating and drinking normally he admits to some dizziness with quick movements for the past week or so.  ED Course: Hip and pelvic imaging demonstrate comminuted fracture of both rami of the acetabulum.  Review of Systems: Please see HPI for pertinent positives and negatives. A complete 10 system review of systems are otherwise negative.  Past Medical History  Diagnosis Date  . Anxiety   . Hypercholesterolemia   . Hypertension 06/16/2011  . NSTEMI (non-ST elevated myocardial infarction) (HCC)     06/16/2011   DR [place]   . Arthritis     LOWER BACK PAIN   . CAD (coronary artery disease)    Past Surgical History  Procedure Laterality Date  . Knee surgery    . Shoulder surgery    . Hernia repair    . Joint replacement    . Cardiac catheterization    . Coronary angioplasty      06/2011   DR [place]   . Cataract extraction w/ intraocular lens  implant, bilateral    . Left heart catheterization with coronary angiogram N/A 06/13/2011    Procedure: LEFT HEART CATHETERIZATION WITH CORONARY ANGIOGRAM;  Surgeon: Peter M [place], MD;  Location: [facility] CATH LAB;  Service: Cardiovascular;  Laterality: N/A;    Social History:  reports that he quit smoking about 74 years ago. He does not have  any smokeless tobacco history on file. He reports that he does not drink alcohol or use illicit drugs.   No Known Allergies  Family History  Problem Relation Age of Onset  . Lung cancer Father   . CAD Father   . CAD Brother   . Heart attack Neg Hx   . Hypertension Neg Hx   . Stroke Neg Hx      Prior to Admission medications   Medication Sig Start Date End Date Taking? Authorizing Provider  citalopram (CELEXA) 20 MG tablet Take 20 mg by mouth daily.    Yes Historical Provider, MD  clopidogrel (PLAVIX) 75 MG tablet TAKE 1/2 TABLET BY MOUTH EVERY DAY WITH BREAKFAST 10/06/15  Yes Corky Crafts, MD  metoprolol tartrate (LOPRESSOR) 25 MG tablet TAKE 1 TABLET BY MOUTH TWICE A DAY 09/04/15  Yes Corky Crafts, MD  rosuvastatin (CRESTOR) 5 MG tablet Take 5 mg by mouth daily.   Yes Historical Provider, MD  Naproxen Sodium (ALEVE PO) Take 1 capsule by mouth 2 (two) times daily as needed (as needed for joint pain).     Historical Provider, MD  nitroGLYCERIN (NITROSTAT) 0.4 MG SL tablet Place 0.4 mg under the tongue every 5 (five) minutes as needed for chest pain (UP TO 3 DOSES BEFORE CALLING 911).    Historical Provider, MD    Physical Exam: BP 133/96 mmHg  Pulse 65  Temp(Src) 97.7 F (36.5 C) (Oral)  Resp 16  SpO2 98%  General:  Alert, oriented, calm, in no acute distress, looks about 80 years old Eyes: pupils round and reactive to light and accomodation, clear sclerea Neck: supple, no masses, trachea mildline  Cardiovascular: RRR, no murmurs or rubs, no peripheral edema  Respiratory: clear to auscultation bilaterally, no wheezes, no crackles  Abdomen: soft, nontender, nondistended, normal bowel tones heard  Skin: dry, no rashes  Musculoskeletal: no joint effusions, normal range of motion except right hip motion limited by pain Psychiatric: appropriate affect, normal speech  Neurologic: extraocular muscles intact, clear speech, moving all extremities with intact sensorium             Labs on Admission:  Basic Metabolic Panel:  Recent Labs Lab 11/13/15 1623  NA 136  K 4.6  CL 103  CO2 28  GLUCOSE 116*  BUN 33*  CREATININE 1.97*  CALCIUM 8.7*   Liver Function Tests: No results for input(s): AST, ALT, ALKPHOS, BILITOT, PROT, ALBUMIN in the last 168 hours. No results for input(s): LIPASE, AMYLASE in the last 168 hours. No results for input(s): AMMONIA in the last 168 hours. CBC:  Recent Labs Lab 11/13/15 1623  WBC 15.8*  NEUTROABS 13.4*  HGB 13.0  HCT 37.7*  MCV 93.1  PLT 302   Cardiac Enzymes: No results for input(s): CKTOTAL, CKMB, CKMBINDEX, TROPONINI in the last 168 hours.  BNP (last 3 results) No results for input(s): BNP in the last 8760 hours.  ProBNP (last 3 results) No results for input(s): PROBNP in the last 8760 hours.  CBG: No results for input(s): GLUCAP in the last 168 hours.  Radiological Exams on Admission: Dg Chest 2 View  11/13/2015  CLINICAL DATA:  Pain following fall.  Hypertension. EXAM: CHEST  2 VIEW COMPARISON:  July 01, 2011 FINDINGS: There is no edema or consolidation. Heart is upper normal in size with pulmonary vascularity within normal limits. Coronary stents are noted in the left anterior descending and right coronary arteries. No adenopathy. No pneumothorax. There is arthropathy in both shoulders. There is evidence of old trauma involving the lateral left clavicle. No acute appearing fracture evident. IMPRESSION: No edema or consolidation. Cardiac stents present. No pneumothorax. Arthropathy in both shoulders with probable chronic rotator cuff tears bilaterally. Electronically Signed   By: Bretta Bang III M.D.   On: 11/13/2015 17:06   Ct Head Wo Contrast  11/13/2015  CLINICAL DATA:  Fell wall working in the yard.  Right hip pain. EXAM: CT HEAD WITHOUT CONTRAST TECHNIQUE: Contiguous axial images were obtained from the base of the skull through the vertex without intravenous contrast. COMPARISON:  08/05/2013  FINDINGS: Brain: No evidence of acute infarction, hemorrhage, extra-axial collection, ventriculomegaly, or mass effect. Generalized cerebral atrophy. Periventricular white matter low attenuation likely secondary to microangiopathy. Vascular: Cerebrovascular atherosclerotic calcifications are noted. Skull: Negative for fracture or focal lesion. Sinuses/Orbits: Visualized portions of the orbits are unremarkable. Visualized portions of the paranasal sinuses and mastoid air cells are unremarkable. Other: None. IMPRESSION: 1. No acute intracranial pathology. 2. Chronic microvascular disease and cerebral atrophy. Electronically Signed   By: Elige Ko   On: 11/13/2015 17:05   Ct Pelvis Wo Contrast  11/13/2015  CLINICAL DATA:  Fall while working in yard. Right hip pain, unable to bear weight. Comminuted right acetabular fracture. EXAM: CT PELVIS WITHOUT CONTRAST TECHNIQUE: Multidetector CT imaging of the pelvis was performed following the standard protocol without intravenous contrast. COMPARISON:  11/13/2015 FINDINGS: There is a fracture of both columns of the right acetabulum, with several fracture planes extending through the anterior wall, a fracture plane extending through the posterior wall, a fracture plane extending up in the iliac bone, and a fracture of the inferior pubic ramus. The ischium is essentially separated away, and continuous with the quadrilateral plate segment which is medially displaced by 1.3 cm. As expected, there is adjacent hematoma in the operator internus muscle. I do not see a fracture of the left pelvic bones. No proximal femur fracture seen. Lower lumbar spondylosis and degenerative disc disease, causing left foraminal impingement at L3-4 and L4-5. Aortoiliac atherosclerotic vascular disease. Left pelvic kidney. Lobular prostate gland with thick calcification along the superior margin. I do not see findings to suggest a bladder leak. Stranding in the right sciatic notch around the  sciatic nerve but without obvious impingement. IMPRESSION: 1. Fracture both columns of the right acetabulum. The quadrilateral plate segment, in continuity with the ischium, is medially displaced 1.3 cm. Adjacent hematoma in the operator internus muscle. I do not see an obvious bladder leak. 2. Left pelvic kidney. 3. Large prostate gland with a calcification along the superior margin. 4.  Aortoiliac atherosclerotic vascular disease. 5. Lower lumbar spondylosis, scoliosis, and degenerative disc disease, with some foraminal impingement at L3-4 and L4-5. Electronically Signed   By: Gaylyn RongWalter  Liebkemann M.D.   On: 11/13/2015 17:18   Dg Hip Unilat  With Pelvis 2-3 Views Right  11/13/2015  CLINICAL DATA:  Pain following fall EXAM: DG HIP (WITH OR WITHOUT PELVIS) 2-3V RIGHT COMPARISON:  None. FINDINGS: Frontal pelvis as well as frontal and lateral right hip images were obtained. There is a comminuted fracture of the right acetabulum with several separated fracture fragments. There is medial bowling in the mid acetabulum, likely due to impaction type injury in this area. Fracture extends into the midportion of the right superior pubic ramus. No other fractures are evident. No dislocation. There is mild symmetric narrowing of both hip joints. There is degenerative change in the lower lumbar spine with lower lumbar levoscoliosis. IMPRESSION: Comminuted fracture of the right acetabulum with multiple displaced fracture fragments. Note that there is medial bowing of the mid acetabulum, likely due to impaction type injury. There is fracture extending into the mid and lateral aspects of the right superior pubic ramus. Alignment in this area is near anatomic. No other fractures. No dislocations. Mild symmetric narrowing of both hip joints noted. Electronically Signed   By: Bretta BangWilliam  Woodruff III M.D.   On: 11/13/2015 16:59      Assessment/Plan Present on Admission:  . Acetabular fracture (HCC) - pain control, bed rest, ortho  to see in consultation, contacted by ER MD. Pt can eat now, will make NPO after MN in case of plans for surgery. Will obtain Echo prior to any possible surgical intervention. . Pelvic fracture (HCC) . (Resolved) Non-STEMI (non-ST elevated myocardial infarction) (HCC) - cont beta blocker, not on ASA at home . Hold plavix now in case of surgery . Hypertension - cont home meds . Coronary artery disease . Hyperlipidemia Active Problems:   Coronary artery disease   Hypertension   Hyperlipidemia   Acetabular fracture (HCC)   Pelvic fracture (HCC)   DVT prophylaxis: SCDs in case of surgery   Code Status: FULL   Family Communication: Present in room.   Disposition Plan: Likely to SNF at discharge.   Consults called: Orthopedic surgery   Admission status: Inpatient   Time spent: 2153  minutes  Levone Otten Vergie Living MD [facility] Pager [phone]  If 7PM-7AM, please contact night-coverage [email or web address] Password [email or web address]  11/13/2015, 5:52 PM

## 2015-11-13 NOTE — Consult Note (Signed)
Reason for Consult: right acetabular fracture Referring Physician: [facility]  Leon Morris is an 82 y.o. male.  HPI: 82 yo male s/p ground level fall today while attempting to mow the lawn.  Patient complained of immediate right hip and groin pain and an inability to bear weight on the right leg.  Patient transported to the [facility] ED for eval and treatment.  Past Medical History  Diagnosis Date  . Anxiety   . Hypercholesterolemia   . Hypertension 06/16/2011  . NSTEMI (non-ST elevated myocardial infarction) ([place])     06/16/2011   DR Martin[place]   . Arthritis     LOWER BACK PAIN   . CAD (coronary artery disease)     Past Surgical History  Procedure Laterality Date  . Knee surgery    . Shoulder surgery    . Hernia repair    . Joint replacement    . Cardiac catheterization    . Coronary angioplasty      06/2011   DR [place]   . Cataract extraction w/ intraocular lens  implant, bilateral    . Left heart catheterization with coronary angiogram N/A 06/13/2011    Procedure: LEFT HEART CATHETERIZATION WITH CORONARY ANGIOGRAM;  Surgeon: Peter M Martinique, MD;  Location: [facility] CATH LAB;  Service: Cardiovascular;  Laterality: N/A;    Family History  Problem Relation Age of Onset  . Lung cancer Father   . CAD Father   . CAD Brother   . Heart attack Neg Hx   . Hypertension Neg Hx   . Stroke Neg Hx     Social History:  reports that he quit smoking about 74 years ago. He has never used smokeless tobacco. He reports that he does not drink alcohol or use illicit drugs.  Allergies: No Known Allergies  Medications: I have reviewed the patient's current medications.  Results for orders placed or performed during the hospital encounter of 11/13/15 (from the past 48 hour(s))  Basic metabolic panel     Status: Abnormal   Collection Time: 11/13/15  4:23 PM  Result Value Ref Range   Sodium 136 135 - 145 mmol/L   Potassium 4.6 3.5 - 5.1 mmol/L   Chloride 103 101 - 111 mmol/L   CO2 28 22 - 32  mmol/L   Glucose, Bld 116 (H) 65 - 99 mg/dL   BUN 33 (H) 6 - 20 mg/dL   Creatinine, Ser 1.97 (H) 0.61 - 1.24 mg/dL   Calcium 8.7 (L) 8.9 - 10.3 mg/dL   GFR calc non Af Amer 28 (L) >60 mL/min   GFR calc Af Amer 33 (L) >60 mL/min    Comment: (NOTE) The eGFR has been calculated using the CKD EPI equation. This calculation has not been validated in all clinical situations. eGFR's persistently <60 mL/min signify possible Chronic Kidney Disease.    Anion gap 5 5 - 15  CBC WITH DIFFERENTIAL     Status: Abnormal   Collection Time: 11/13/15  4:23 PM  Result Value Ref Range   WBC 15.8 (H) 4.0 - 10.5 K/uL   RBC 4.05 (L) 4.22 - 5.81 MIL/uL   Hemoglobin 13.0 13.0 - 17.0 g/dL   HCT 37.7 (L) 39.0 - 52.0 %   MCV 93.1 78.0 - 100.0 fL   MCH 32.1 26.0 - 34.0 pg   MCHC 34.5 30.0 - 36.0 g/dL   RDW 13.6 11.5 - 15.5 %   Platelets 302 150 - 400 K/uL   Neutrophils Relative % 84 %  Neutro Abs 13.4 (H) 1.7 - 7.7 K/uL   Lymphocytes Relative 8 %   Lymphs Abs 1.2 0.7 - 4.0 K/uL   Monocytes Relative 7 %   Monocytes Absolute 1.1 (H) 0.1 - 1.0 K/uL   Eosinophils Relative 1 %   Eosinophils Absolute 0.1 0.0 - 0.7 K/uL   Basophils Relative 0 %   Basophils Absolute 0.0 0.0 - 0.1 K/uL  Protime-INR     Status: None   Collection Time: 11/13/15  4:23 PM  Result Value Ref Range   Prothrombin Time 14.4 11.6 - 15.2 seconds   INR 1.10 0.00 - 1.49  Type and screen [place]     Status: None   Collection Time: 11/13/15  4:23 PM  Result Value Ref Range   ABO/RH(D) O POS    Antibody Screen NEG    Sample Expiration 11/16/2015     Dg Chest 2 View  11/13/2015  CLINICAL DATA:  Pain following fall.  Hypertension. EXAM: CHEST  2 VIEW COMPARISON:  July 01, 2011 FINDINGS: There is no edema or consolidation. Heart is upper normal in size with pulmonary vascularity within normal limits. Coronary stents are noted in the left anterior descending and right coronary arteries. No adenopathy. No  pneumothorax. There is arthropathy in both shoulders. There is evidence of old trauma involving the lateral left clavicle. No acute appearing fracture evident. IMPRESSION: No edema or consolidation. Cardiac stents present. No pneumothorax. Arthropathy in both shoulders with probable chronic rotator cuff tears bilaterally. Electronically Signed   By: Lowella Grip III M.D.   On: 11/13/2015 17:06   Ct Head Wo Contrast  11/13/2015  CLINICAL DATA:  Fell wall working in the yard.  Right hip pain. EXAM: CT HEAD WITHOUT CONTRAST TECHNIQUE: Contiguous axial images were obtained from the base of the skull through the vertex without intravenous contrast. COMPARISON:  08/05/2013 FINDINGS: Brain: No evidence of acute infarction, hemorrhage, extra-axial collection, ventriculomegaly, or mass effect. Generalized cerebral atrophy. Periventricular white matter low attenuation likely secondary to microangiopathy. Vascular: Cerebrovascular atherosclerotic calcifications are noted. Skull: Negative for fracture or focal lesion. Sinuses/Orbits: Visualized portions of the orbits are unremarkable. Visualized portions of the paranasal sinuses and mastoid air cells are unremarkable. Other: None. IMPRESSION: 1. No acute intracranial pathology. 2. Chronic microvascular disease and cerebral atrophy. Electronically Signed   By: Kathreen Devoid   On: 11/13/2015 17:05   Ct Pelvis Wo Contrast  11/13/2015  CLINICAL DATA:  Fall while working in yard. Right hip pain, unable to bear weight. Comminuted right acetabular fracture. EXAM: CT PELVIS WITHOUT CONTRAST TECHNIQUE: Multidetector CT imaging of the pelvis was performed following the standard protocol without intravenous contrast. COMPARISON:  11/13/2015 FINDINGS: There is a fracture of both columns of the right acetabulum, with several fracture planes extending through the anterior wall, a fracture plane extending through the posterior wall, a fracture plane extending up in the iliac bone, and  a fracture of the inferior pubic ramus. The ischium is essentially separated away, and continuous with the quadrilateral plate segment which is medially displaced by 1.3 cm. As expected, there is adjacent hematoma in the operator internus muscle. I do not see a fracture of the left pelvic bones. No proximal femur fracture seen. Lower lumbar spondylosis and degenerative disc disease, causing left foraminal impingement at L3-4 and L4-5. Aortoiliac atherosclerotic vascular disease. Left pelvic kidney. Lobular prostate gland with thick calcification along the superior margin. I do not see findings to suggest a bladder leak. Stranding in the  right sciatic notch around the sciatic nerve but without obvious impingement. IMPRESSION: 1. Fracture both columns of the right acetabulum. The quadrilateral plate segment, in continuity with the ischium, is medially displaced 1.3 cm. Adjacent hematoma in the operator internus muscle. I do not see an obvious bladder leak. 2. Left pelvic kidney. 3. Large prostate gland with a calcification along the superior margin. 4.  Aortoiliac atherosclerotic vascular disease. 5. Lower lumbar spondylosis, scoliosis, and degenerative disc disease, with some foraminal impingement at L3-4 and L4-5. Electronically Signed   By: Van Clines M.D.   On: 11/13/2015 17:18   Dg Hip Unilat  With Pelvis 2-3 Views Right  11/13/2015  CLINICAL DATA:  Pain following fall EXAM: DG HIP (WITH OR WITHOUT PELVIS) 2-3V RIGHT COMPARISON:  None. FINDINGS: Frontal pelvis as well as frontal and lateral right hip images were obtained. There is a comminuted fracture of the right acetabulum with several separated fracture fragments. There is medial bowling in the mid acetabulum, likely due to impaction type injury in this area. Fracture extends into the midportion of the right superior pubic ramus. No other fractures are evident. No dislocation. There is mild symmetric narrowing of both hip joints. There is  degenerative change in the lower lumbar spine with lower lumbar levoscoliosis. IMPRESSION: Comminuted fracture of the right acetabulum with multiple displaced fracture fragments. Note that there is medial bowing of the mid acetabulum, likely due to impaction type injury. There is fracture extending into the mid and lateral aspects of the right superior pubic ramus. Alignment in this area is near anatomic. No other fractures. No dislocations. Mild symmetric narrowing of both hip joints noted. Electronically Signed   By: Lowella Grip III M.D.   On: 11/13/2015 16:59    ROS Blood pressure 177/66, pulse 81, temperature 97.8 F (36.6 C), temperature source Oral, resp. rate 16, SpO2 98 %. Physical Exam  AAO, neck non tender, chest and abdomen non tender, bilateral UEs including clavicles non tender and no swelling,  Abrasion right shoulder, Right LE: leg length equal, NVI distally, no pain with calf pumps, pain is noted with log roll, left Hip and leg normal AROM no pain  Assessment/Plan: Comminuted minimally displaced right acetabular fracture with concentrically reduced hip I discussed this case and the imaging with Dr Altamese Benzonia, pelvis trauma specialist, who recommended conservative (non operative) management. Plan: Strict NWB right LE, pivot transfers on left Will need SNF rehab as patient lives alone at home. DVT prophylaxis - mechanical Mobilization with PT, OT for ADLs and adaptive equipment Overhead frame and trapeze  Leonce Bale,STEVEN R 11/13/2015, 7:11 PM

## 2015-11-13 NOTE — ED Provider Notes (Signed)
CSN: [id number]     Arrival date & time 11/13/15  1558 History   First MD Initiated Contact with Patient 11/13/15 1604     Chief Complaint  Patient presents with  . Hip Pain     (Consider location/radiation/quality/duration/timing/severity/associated sxs/prior Treatment) Patient is a 82 y.o. male presenting with hip pain. The history is provided by the patient.  Hip Pain This is a new problem. The current episode started less than 1 hour ago. The problem occurs constantly. The problem has not changed since onset.Pertinent negatives include no chest pain, no abdominal pain, no headaches and no shortness of breath. The symptoms are aggravated by walking, bending, twisting, standing and exertion. Nothing relieves the symptoms. He has tried nothing for the symptoms. The treatment provided no relief.    82 yo M With a chief complaint of right hip pain. Patient was edging his yard when he stepped off the curb and fell down backwards. Landed on his right hip. Denies other injury. Was unable to walk post injury.   Past Medical History  Diagnosis Date  . Anxiety   . Hypercholesterolemia   . Hypertension 06/16/2011  . NSTEMI (non-ST elevated myocardial infarction) ([facility])     06/16/2011   DR [place]   . Arthritis     LOWER BACK PAIN   . CAD (coronary artery disease)    Past Surgical History  Procedure Laterality Date  . Knee surgery    . Shoulder surgery    . Hernia repair    . Joint replacement    . Cardiac catheterization    . Coronary angioplasty      06/2011   DR [place]ORD[place]   . Cataract extraction w/ intraocular lens  implant, bilateral    . Left heart catheterization with coronary angiogram N/A 06/13/2011    Procedure: LEFT HEART CATHETERIZATION WITH CORONARY ANGIOGRAM;  Surgeon: Peter M SwazilandJordan, MD;  [facility];  Service: Cardiovascular;  Laterality: N/A;   Family History  Problem Relation Age of Onset  . Lung cancer Father   . CAD Father   . CAD Brother   . Heart attack Neg Hx    . Hypertension Neg Hx   . Stroke Neg Hx    Social History  Substance Use Topics  . Smoking status: Former Smoker    Quit date: 05/18/1941  . Smokeless tobacco: None  . Alcohol Use: No    Review of Systems  Constitutional: Negative for fever and chills.  HENT: Negative for congestion and facial swelling.   Eyes: Negative for discharge and visual disturbance.  Respiratory: Negative for shortness of breath.   Cardiovascular: Negative for chest pain and palpitations.  Gastrointestinal: Negative for vomiting, abdominal pain and diarrhea.  Musculoskeletal: Positive for myalgias, arthralgias and gait problem.  Skin: Negative for color change and rash.  Neurological: Negative for tremors, syncope and headaches.  Psychiatric/Behavioral: Negative for confusion and dysphoric mood.      Allergies  Review of patient's allergies indicates no known allergies.  Home Medications   Prior to Admission medications   Medication Sig Start Date End Date Taking? Authorizing Provider  citalopram (CELEXA) 20 MG tablet Take 20 mg by mouth daily.     Historical Provider, MD  clopidogrel (PLAVIX) 75 MG tablet TAKE 1/2 TABLET BY MOUTH EVERY DAY WITH BREAKFAST 10/06/15   Corky CraftsJayadeep S Varanasi, MD  metoprolol tartrate (LOPRESSOR) 25 MG tablet TAKE 1 TABLET BY MOUTH TWICE A DAY 09/04/15   Corky CraftsJayadeep S Varanasi, MD  Naproxen Sodium (  ALEVE PO) Take 1 capsule by mouth 2 (two) times daily as needed (as needed for joint pain).     Historical Provider, MD  nitroGLYCERIN (NITROSTAT) 0.4 MG SL tablet Place 0.4 mg under the tongue every 5 (five) minutes as needed for chest pain (UP TO 3 DOSES BEFORE CALLING 911).    Historical Provider, MD  rosuvastatin (CRESTOR) 5 MG tablet Take 5 mg by mouth daily.    Historical Provider, MD   BP 155/140 mmHg  Pulse 68  Temp(Src) 97.7 F (36.5 C) (Oral)  Resp 16  SpO2 93% Physical Exam  Constitutional: He is oriented to person, place, and time. He appears well-developed and  well-nourished.  HENT:  Head: Normocephalic and atraumatic.  Eyes: EOM are normal. Pupils are equal, round, and reactive to light.  Neck: Normal range of motion. Neck supple. No JVD present.  Cardiovascular: Normal rate and regular rhythm.  Exam reveals no gallop and no friction rub.   No murmur heard. Pulmonary/Chest: No respiratory distress. He has no wheezes.  Abdominal: He exhibits no distension. There is no rebound and no guarding.  Musculoskeletal: Normal range of motion. He exhibits tenderness.  Tenderness with internal and external rotation of the right lower extremity. Pulse motor and sensation intact distally.  Neurological: He is alert and oriented to person, place, and time.  Skin: No rash noted. No pallor.  Psychiatric: He has a normal mood and affect. His behavior is normal.  Nursing note and vitals reviewed.   ED Course  Procedures (including critical care time) Labs Review Labs Reviewed  BASIC METABOLIC PANEL - Abnormal; Notable for the following:    Glucose, Bld 116 (*)    BUN 33 (*)    Creatinine, Ser 1.97 (*)    Calcium 8.7 (*)    GFR calc non Af Amer 28 (*)    GFR calc Af Amer 33 (*)    All other components within normal limits  CBC WITH DIFFERENTIAL/PLATELET - Abnormal; Notable for the following:    WBC 15.8 (*)    RBC 4.05 (*)    HCT 37.7 (*)    Neutro Abs 13.4 (*)    Monocytes Absolute 1.1 (*)    All other components within normal limits  PROTIME-INR  URINALYSIS, ROUTINE W REFLEX MICROSCOPIC (NOT AT [facility])  TYPE AND SCREEN    Imaging Review Dg Chest 2 View  11/13/2015  CLINICAL DATA:  Pain following fall.  Hypertension. EXAM: CHEST  2 VIEW COMPARISON:  July 01, 2011 FINDINGS: There is no edema or consolidation. Heart is upper normal in size with pulmonary vascularity within normal limits. Coronary stents are noted in the left anterior descending and right coronary arteries. No adenopathy. No pneumothorax. There is arthropathy in both shoulders. There  is evidence of old trauma involving the lateral left clavicle. No acute appearing fracture evident. IMPRESSION: No edema or consolidation. Cardiac stents present. No pneumothorax. Arthropathy in both shoulders with probable chronic rotator cuff tears bilaterally. Electronically Signed   By: Bretta Bang III M.D.   On: 11/13/2015 17:06   Ct Head Wo Contrast  11/13/2015  CLINICAL DATA:  Fell wall working in the yard.  Right hip pain. EXAM: CT HEAD WITHOUT CONTRAST TECHNIQUE: Contiguous axial images were obtained from the base of the skull through the vertex without intravenous contrast. COMPARISON:  08/05/2013 FINDINGS: Brain: No evidence of acute infarction, hemorrhage, extra-axial collection, ventriculomegaly, or mass effect. Generalized cerebral atrophy. Periventricular white matter low attenuation likely secondary to microangiopathy. Vascular: Cerebrovascular  atherosclerotic calcifications are noted. Skull: Negative for fracture or focal lesion. Sinuses/Orbits: Visualized portions of the orbits are unremarkable. Visualized portions of the paranasal sinuses and mastoid air cells are unremarkable. Other: None. IMPRESSION: 1. No acute intracranial pathology. 2. Chronic microvascular disease and cerebral atrophy. Electronically Signed   By: Elige Ko   On: 11/13/2015 17:05   Ct Pelvis Wo Contrast  11/13/2015  CLINICAL DATA:  Fall while working in yard. Right hip pain, unable to bear weight. Comminuted right acetabular fracture. EXAM: CT PELVIS WITHOUT CONTRAST TECHNIQUE: Multidetector CT imaging of the pelvis was performed following the standard protocol without intravenous contrast. COMPARISON:  11/13/2015 FINDINGS: There is a fracture of both columns of the right acetabulum, with several fracture planes extending through the anterior wall, a fracture plane extending through the posterior wall, a fracture plane extending up in the iliac bone, and a fracture of the inferior pubic ramus. The ischium is  essentially separated away, and continuous with the quadrilateral plate segment which is medially displaced by 1.3 cm. As expected, there is adjacent hematoma in the operator internus muscle. I do not see a fracture of the left pelvic bones. No proximal femur fracture seen. Lower lumbar spondylosis and degenerative disc disease, causing left foraminal impingement at L3-4 and L4-5. Aortoiliac atherosclerotic vascular disease. Left pelvic kidney. Lobular prostate gland with thick calcification along the superior margin. I do not see findings to suggest a bladder leak. Stranding in the right sciatic notch around the sciatic nerve but without obvious impingement. IMPRESSION: 1. Fracture both columns of the right acetabulum. The quadrilateral plate segment, in continuity with the ischium, is medially displaced 1.3 cm. Adjacent hematoma in the operator internus muscle. I do not see an obvious bladder leak. 2. Left pelvic kidney. 3. Large prostate gland with a calcification along the superior margin. 4.  Aortoiliac atherosclerotic vascular disease. 5. Lower lumbar spondylosis, scoliosis, and degenerative disc disease, with some foraminal impingement at L3-4 and L4-5. Electronically Signed   By: Gaylyn Rong M.D.   On: 11/13/2015 17:18   Dg Hip Unilat  With Pelvis 2-3 Views Right  11/13/2015  CLINICAL DATA:  Pain following fall EXAM: DG HIP (WITH OR WITHOUT PELVIS) 2-3V RIGHT COMPARISON:  None. FINDINGS: Frontal pelvis as well as frontal and lateral right hip images were obtained. There is a comminuted fracture of the right acetabulum with several separated fracture fragments. There is medial bowling in the mid acetabulum, likely due to impaction type injury in this area. Fracture extends into the midportion of the right superior pubic ramus. No other fractures are evident. No dislocation. There is mild symmetric narrowing of both hip joints. There is degenerative change in the lower lumbar spine with lower lumbar  levoscoliosis. IMPRESSION: Comminuted fracture of the right acetabulum with multiple displaced fracture fragments. Note that there is medial bowing of the mid acetabulum, likely due to impaction type injury. There is fracture extending into the mid and lateral aspects of the right superior pubic ramus. Alignment in this area is near anatomic. No other fractures. No dislocations. Mild symmetric narrowing of both hip joints noted. Electronically Signed   By: Bretta Bang III M.D.   On: 11/13/2015 16:59   I have personally reviewed and evaluated these images and lab results as part of my medical decision-making.   EKG Interpretation   Date/Time:  Thursday November 13 2015 17:14:26 EDT Ventricular Rate:  69 PR Interval:  222 QRS Duration: 93 QT Interval:  451 QTC Calculation: 483 R  Axis:   12 Text Interpretation:  Sinus rhythm Prolonged PR interval Borderline  prolonged QT interval Baseline wander in lead(s) II III aVF No significant  change since last tracing Confirmed by Mery Guadalupe MD, Reuel Boom ([id number]) on  11/13/2015 5:28:37 PM      MDM   Final diagnoses:  Closed displaced combined transverse-posterior fracture of right acetabulum, initial encounter [facility])    82 yo M the chief complaint of right hip pain. X-ray by my view concerning for possible pelvic versus acetabular fracture. We'll obtain a CT scan for further evaluation.  CT scan with complex acetabular fx, patient unable to walk, needs admission, will discuss with ortho.   Discussed with Dr. Ranell Patrick, will evaluate patient in the hospital, due to age and cardiac hx, recommended hospitalist admit.   The patients results and plan were reviewed and discussed.   Any x-rays performed were independently reviewed by myself.   Differential diagnosis were considered with the presenting HPI.  Medications  sodium chloride 0.9 % bolus 500 mL (not administered)    And  0.9 %  sodium chloride infusion (not administered)  acetaminophen (TYLENOL)  tablet 1,000 mg (1,000 mg Oral Given 11/13/15 1713)  morphine 2 MG/ML injection 2 mg (2 mg Intravenous Given 11/13/15 1712)  ondansetron (ZOFRAN) injection 4 mg (4 mg Intravenous Given 11/13/15 1710)    Filed Vitals:   11/13/15 1604 11/13/15 1607  BP: 155/140   Pulse: 68   Temp:  97.7 F (36.5 C)  TempSrc:  Oral  Resp: 16   SpO2: 93%     Final diagnoses:  Closed displaced combined transverse-posterior fracture of right acetabulum, initial encounter [facility])    Admission/ observation were discussed with the admitting physician, patient and/or family and they are comfortable with the plan.    Melene Plan, DO 11/13/15 1728

## 2015-11-14 ENCOUNTER — Inpatient Hospital Stay (HOSPITAL_COMMUNITY): Payer: Medicare Other

## 2015-11-14 DIAGNOSIS — I2510 Atherosclerotic heart disease of native coronary artery without angina pectoris: Secondary | ICD-10-CM

## 2015-11-14 LAB — ECHOCARDIOGRAM COMPLETE
CHL CUP MV DEC (S): 204
E decel time: 204 msec
E/e' ratio: 14.85
FS: 41 % (ref 28–44)
HEIGHTINCHES: 62 in
IV/PV OW: 0.9
LA vol A4C: 34 ml
LA vol: 43.2 mL
LADIAMINDEX: 2.24 cm/m2
LASIZE: 37 mm
LAVOLIN: 26.2 mL/m2
LEFT ATRIUM END SYS DIAM: 37 mm
LV E/e' medial: 14.85
LV PW d: 13.7 mm — AB (ref 0.6–1.1)
LV TDI E'MEDIAL: 6.64
LVEEAVG: 14.85
LVELAT: 5.98 cm/s
MV pk A vel: 119 m/s
MV pk E vel: 88.8 m/s
MVPG: 3 mmHg
TAPSE: 20.4 mm
TDI e' lateral: 5.98
Weight: 2272 oz

## 2015-11-14 LAB — CBC
HCT: 36.5 % — ABNORMAL LOW (ref 39.0–52.0)
Hemoglobin: 12 g/dL — ABNORMAL LOW (ref 13.0–17.0)
MCH: 31.9 pg (ref 26.0–34.0)
MCHC: 32.9 g/dL (ref 30.0–36.0)
MCV: 97.1 fL (ref 78.0–100.0)
PLATELETS: 271 10*3/uL (ref 150–400)
RBC: 3.76 MIL/uL — AB (ref 4.22–5.81)
RDW: 13.8 % (ref 11.5–15.5)
WBC: 11.7 10*3/uL — ABNORMAL HIGH (ref 4.0–10.5)

## 2015-11-14 LAB — BASIC METABOLIC PANEL
Anion gap: 3 — ABNORMAL LOW (ref 5–15)
BUN: 30 mg/dL — AB (ref 6–20)
CO2: 29 mmol/L (ref 22–32)
CREATININE: 1.63 mg/dL — AB (ref 0.61–1.24)
Calcium: 8.2 mg/dL — ABNORMAL LOW (ref 8.9–10.3)
Chloride: 105 mmol/L (ref 101–111)
GFR calc non Af Amer: 35 mL/min — ABNORMAL LOW (ref >60)
GFR, EST AFRICAN AMERICAN: 41 mL/min — AB (ref >60)
GLUCOSE: 107 mg/dL — AB (ref 65–99)
Potassium: 4.5 mmol/L (ref 3.5–5.1)
Sodium: 137 mmol/L (ref 135–145)

## 2015-11-14 MED ORDER — HYDRALAZINE HCL 20 MG/ML IJ SOLN
5.0000 mg | Freq: Four times a day (QID) | INTRAMUSCULAR | Status: DC | PRN
  Administered 2015-11-15: 5 mg via INTRAVENOUS
  Filled 2015-11-14: qty 1

## 2015-11-14 MED ORDER — CLOPIDOGREL BISULFATE 75 MG PO TABS
37.5000 mg | ORAL_TABLET | Freq: Every day | ORAL | Status: DC
  Administered 2015-11-14 – 2015-11-15 (×2): 37.5 mg via ORAL
  Filled 2015-11-14 (×2): qty 1

## 2015-11-14 NOTE — Progress Notes (Signed)
*  PRELIMINARY RESULTS* Echocardiogram 2D Echocardiogram has been performed.  Jeryl Columbialliott, Arlinda Barcelona 11/14/2015, 4:10 PM

## 2015-11-14 NOTE — Clinical Social Work Note (Signed)
Clinical Social Work Assessment  Patient Details  Name: Leon Morris MRN: [id number] Date of Birth: [birth year]  Date of referral:  11/14/15               Reason for consult:  Facility Placement                Permission sought to share information with:  Case Manager, Magazine features editor, Family Supports Permission granted to share information::  Yes, Verbal Permission Granted  Name::        Agency::  SNF bed search  Relationship::  son or daughter  Contact Information:     Housing/Transportation Living arrangements for the past 2 months:  Single Family Home Source of Information:  Patient, Medical Team Patient Interpreter Needed:  None Criminal Activity/Legal Involvement Pertinent to Current Situation/Hospitalization:  No - Comment as needed Significant Relationships:  Adult Children, Other Family Members, Friend Lives with:  Self Do you feel safe going back to the place where you live?  Yes (hoping to return home, but not sure if he can tolerate pain) Need for family participation in patient care:  No (Coment)  Care giving concerns:  Patient reports he currently lives alone and up until admission, very indpendent with ADLs and getting around. Reports he still excerises consistenly during the week and has many friends who visit and check on him. Reports his son and daughter are also in town and check in on him, but he does live alone. Reports no concerns with care, but his pain is high related to his reason for admission. Reports he would like to see how he does in the next day and if he is able he would like to go home. LCSW explained recommendations for SNF and ST rehab. Patient open to SNF, but not yet agreeable as disposition. He has given permission to fax information out for bed offers if he chooses SNF.   Social Worker assessment / plan:  LCSW completed work up for SNF and completed assessment. PT recommending SNF due to non-weight bearing status and living  alone. Patient pain is high and he lays flat in the bed. He reports he will think about SNF, ultimately his goal is to go home, but he will need 24/7 support, which is the questionable factor. Will follow up with bed offers this weekend and follow patient acutely. FL2 updated  Employment status:  Retired Database administrator PT Recommendations:  Skilled Nursing Facility Information / Referral to community resources:  Skilled Nursing Facility  Patient/Family's Response to care:  Agreeable to plan  Patient/Family's Understanding of and Emotional Response to Diagnosis, Current Treatment, and Prognosis:  Patient aware of his current reason for admission and accepting of consult. Ultimately he would like to be home, but understands and is realistic regarding what he can manage at home and his pain level.  He reports he does not want to make a decision today, wants some options and bed offers as he has never been to SNF and to see how he progresses.  Emotional Assessment Appearance:  Appears stated age Attitude/Demeanor/Rapport:  Other (reports high pain in leg, but very pleasant and cooperative) Affect (typically observed):  Accepting, Adaptable, Pleasant Orientation:  Oriented to Self, Oriented to Place, Oriented to  Time, Oriented to Situation Alcohol / Substance use:  Not Applicable Psych involvement (Current and /or in the community):  No (Comment)  Discharge Needs  Concerns to be addressed:  No discharge needs identified Readmission within the last  30 days:  No Current discharge risk:  None Barriers to Discharge:  No Barriers Identified   Raye SorrowCoble, Ming Kunka N, LCSW 11/14/2015, 2:43 PM

## 2015-11-14 NOTE — Clinical Social Work Placement (Signed)
   CLINICAL SOCIAL WORK PLACEMENT  NOTE  Date:  11/14/2015  Patient Details  Name: Laqueta Lindenrnest J Lemmerman MRN: [id number] Date of Birth: 09-Sep-1922  Clinical Social Work is seeking post-discharge placement for this patient at the Skilled  Nursing Facility level of care (*CSW will initial, date and re-position this form in  chart as items are completed):  Yes   Patient/family provided with [facility] Clinical Social Work Department's list of facilities offering this level of care within the geographic area requested by the patient (or if unable, by the patient's family).  Yes   Patient/family informed of their freedom to choose among providers that offer the needed level of care, that participate in Medicare, Medicaid or managed care program needed by the patient, have an available bed and are willing to accept the patient.  Yes   Patient/family informed of [facility]'s ownership interest in [facility] and [facility], as well as of the fact that they are under no obligation to receive care at these facilities.  PASRR submitted to EDS on 11/14/15     PASRR number received on 11/14/15     Existing PASRR number confirmed on 11/14/15     FL2 transmitted to all facilities in geographic area requested by pt/family on       FL2 transmitted to all facilities within larger geographic area on       Patient informed that his/her managed care company has contracts with or will negotiate with certain facilities, including the following:            Patient/family informed of bed offers received.  Patient chooses bed at       Physician recommends and patient chooses bed at      Patient to be transferred to   on  .  Patient to be transferred to facility by       Patient family notified on   of transfer.  Name of family member notified:        PHYSICIAN Please sign FL2     Additional Comment:    _______________________________________________ Raye Sorrowoble, Judith Campillo N, LCSW 11/14/2015,  2:48 PM

## 2015-11-14 NOTE — Evaluation (Signed)
Physical Therapy Evaluation Patient Details Name: Leon Morris MRN: [id number] DOB: 06/10/1922 Today's Date: 11/14/2015   History of Present Illness  s/p fall resulting in acetabulum fx. conservative tx with NWB on RLE  Clinical Impression  Pt admitted as above and presenting with functional mobility limitations 2* pain and NWB status R LE.  Pt would benefit from follow up rehab at SNF level to maximize IND and safety prior to return home with ltd assist.    Follow Up Recommendations SNF    Equipment Recommendations  None recommended by PT    Recommendations for Other Services OT consult     Precautions / Restrictions Precautions Precautions: Fall Restrictions Weight Bearing Restrictions: Yes Other Position/Activity Restrictions: NWB      Mobility  Bed Mobility Overal bed mobility: Needs Assistance;+2 for physical assistance Bed Mobility: Supine to Sit     Supine to sit: Mod assist;+2 for physical assistance     General bed mobility comments: assist for RLE and trunk.  Cues to use arms to push up  Transfers Overall transfer level: Needs assistance Equipment used: Right platform walker Transfers: Sit to/from Stand Sit to Stand: Mod assist;+2 physical assistance Stand pivot transfers: Mod assist;+2 physical assistance       General transfer comment: Assist to rise and stabilize.  Cues for UE/LE placement as well as NWB.  Assist to keep RLE forward  Ambulation/Gait             General Gait Details: Stand/Pvt only 2* pts difficulty complying with NWB  Stairs            Wheelchair Mobility    Modified Rankin (Stroke Patients Only)       Balance Overall balance assessment: Needs assistance Sitting-balance support: No upper extremity supported;Feet supported Sitting balance-Leahy Scale: Good     Standing balance support: Bilateral upper extremity supported Standing balance-Leahy Scale: Poor                                Pertinent Vitals/Pain Pain Assessment: Faces Faces Pain Scale: Hurts even more Pain Location: RLE Pain Descriptors / Indicators: Aching;Sore Pain Intervention(s): Limited activity within patient's tolerance;Monitored during session;Premedicated before session;Ice applied    Home Living Family/patient expects to be discharged to:: Skilled nursing facility Living Arrangements: Alone               Additional Comments: has walker and high commode at home    Prior Function Level of Independence: Independent               Hand Dominance        Extremity/Trunk Assessment   Upper Extremity Assessment: Overall WFL for tasks assessed           Lower Extremity Assessment: RLE deficits/detail      Cervical / Trunk Assessment: Kyphotic  Communication   Communication: No difficulties  Cognition Arousal/Alertness: Awake/alert Behavior During Therapy: WFL for tasks assessed/performed   Area of Impairment: Following commands       Following Commands: Follows one step commands inconsistently       General Comments: pt needed repeated cues at times; difficulty with directionality and NWB--may be due to medication    General Comments      Exercises General Exercises - Lower Extremity Ankle Circles/Pumps: AROM;Both;15 reps;Supine      Assessment/Plan    PT Assessment Patient needs continued PT services  PT Diagnosis Difficulty walking   PT  Problem List Decreased strength;Decreased range of motion;Decreased activity tolerance;Decreased balance;Decreased mobility;Decreased knowledge of use of DME;Pain;Decreased knowledge of precautions  PT Treatment Interventions DME instruction;Gait training;Functional mobility training;Therapeutic activities;Therapeutic exercise;Patient/family education   PT Goals (Current goals can be found in the Care Plan section) Acute Rehab PT Goals Patient Stated Goal: decreased pain and return to being independent PT Goal  Formulation: With patient Time For Goal Achievement: 11/21/15 Potential to Achieve Goals: Fair    Frequency Min 3X/week   Barriers to discharge        Co-evaluation               End of Session Equipment Utilized During Treatment: Gait belt Activity Tolerance: Patient tolerated treatment well;Patient limited by pain Patient left: in chair;with call bell/phone within reach;with chair alarm set;with family/visitor present Nurse Communication: Mobility status         Time: 1100-1125 PT Time Calculation (min) (ACUTE ONLY): 25 min   Charges:   PT Evaluation $PT Eval Low Complexity: 1 Procedure     PT G Codes:        Aaradhya Kysar 11/14/2015, 1:08 PM

## 2015-11-14 NOTE — NC FL2 (Signed)
[place] MEDICAID FL2 LEVEL OF CARE SCREENING TOOL     IDENTIFICATION  Patient Name: Leon Morris Birthdate: January 21, 1923 Sex: male Admission Date (Current Location): 11/13/2015  [facility] [place]   Facility and Address:  [facility],  [address], [place] [id number]      Provider Number: [id number]  Attending Physician Name and Address:  Mir Vergie LivingMohammed Ikramullah,*  Relative Name and Phone Number:       Current Level of Care: Hospital Recommended Level of Care: Skilled Nursing Facility Prior Approval Number:    Date Approved/Denied:   PASRR Number: [id number]  Discharge Plan: SNF    Current Diagnoses: Patient Active Problem List   Diagnosis Date Noted  . Acetabular fracture (HCC) 11/13/2015  . Pelvic fracture (HCC) 11/13/2015  . Old MI (myocardial infarction) 10/29/2015  . Pseudoaneurysm (HCC) 07/02/2011  . Coronary artery disease 06/16/2011  . Hypertension 06/16/2011  . Hyperlipidemia 06/16/2011    Orientation RESPIRATION BLADDER Height & Weight     Self  Normal Continent Weight: 142 lb (64.411 kg) Height:  5\' 2"  (157.5 cm)  BEHAVIORAL SYMPTOMS/MOOD NEUROLOGICAL BOWEL NUTRITION STATUS      Continent Diet (regular)  AMBULATORY STATUS COMMUNICATION OF NEEDS Skin   Extensive Assist Verbally Normal                       Personal Care Assistance Level of Assistance  Bathing, Feeding, Dressing Bathing Assistance: Limited assistance Feeding assistance: Independent Dressing Assistance: Limited assistance     Functional Limitations Info  Sight, Hearing, Speech Sight Info: Adequate Hearing Info: Adequate Speech Info: Adequate    SPECIAL CARE FACTORS FREQUENCY  PT (By licensed PT), OT (By licensed OT)     PT Frequency: 3x OT Frequency: 3x            Contractures Contractures Info: Not present    Additional Factors Info  Code Status, Allergies, Psychotropic Code Status Info: Full Code Allergies Info:  NKA Psychotropic Info: Celexa         Current Medications (11/14/2015):  This is the current hospital active medication list Current Facility-Administered Medications  Medication Dose Route Frequency Provider Last Rate Last Dose  . 0.9 %  sodium chloride infusion   Intravenous Continuous Melene Planan Floyd, DO 75 mL/hr at 11/14/15 0003    . acetaminophen (TYLENOL) tablet 650 mg  650 mg Oral Q6H PRN Mir Vergie LivingMohammed Ikramullah, MD   650 mg at 11/14/15 [id number]   Or  . acetaminophen (TYLENOL) suppository 650 mg  650 mg Rectal Q6H PRN Mir Vergie LivingMohammed Ikramullah, MD      . citalopram (CELEXA) tablet 20 mg  20 mg Oral Daily Mir Vergie LivingMohammed Ikramullah, MD   20 mg at 11/14/15 0837  . clopidogrel (PLAVIX) tablet 37.5 mg  37.5 mg Oral Daily Mir Vergie LivingMohammed Ikramullah, MD   37.5 mg at 11/14/15 0837  . hydrALAZINE (APRESOLINE) injection 5 mg  5 mg Intravenous Q6H PRN Mir Vergie LivingMohammed Ikramullah, MD      . metoprolol tartrate (LOPRESSOR) tablet 25 mg  25 mg Oral BID Mir Vergie LivingMohammed Ikramullah, MD   25 mg at 11/14/15 0837  . morphine 2 MG/ML injection 1 mg  1 mg Intravenous Q3H PRN Mir Vergie LivingMohammed Ikramullah, MD      . ondansetron [facility](ZOFRAN) tablet 4 mg  4 mg Oral Q6H PRN Mir Vergie LivingMohammed Ikramullah, MD       Or  . ondansetron [facility](ZOFRAN) injection 4 mg  4 mg Intravenous Q6H PRN  Mir Vergie Living, MD      . oxyCODONE (Oxy IR/ROXICODONE) immediate release tablet 5 mg  5 mg Oral Q4H PRN Mir Vergie Living, MD   5 mg at 11/14/15 1026  . rosuvastatin (CRESTOR) tablet 5 mg  5 mg Oral Daily Mir Vergie Living, MD   5 mg at 11/14/15 0836  . senna-docusate (Senokot-S) tablet 1 tablet  1 tablet Oral QHS PRN Mir Vergie Living, MD       Facility-Administered Medications Ordered in Other Encounters  Medication Dose Route Frequency Provider Last Rate Last Dose  . lactated ringers infusion    Continuous PRN Luster Landsberg, CRNA         Discharge Medications: Please see discharge summary for a list of discharge medications.  Relevant  Imaging Results:  Relevant Lab Results:   Additional Information SS#: [id number]  Raye Sorrow, LCSW

## 2015-11-14 NOTE — Progress Notes (Signed)
PROGRESS NOTE  [facility]nest [facility] ZOX:[id number] DOB: May 01, 1923 DOA: 11/13/2015 PCP: Pearla DubonnetGATES,ROBERT NEVILL, MD  HPI/Recap of past 24 hours: Active [age] male with R acetabular fracture after GLF at home 6/8 seen by orthopedics who recommends conservative management. He currently denies chest pain, shortness of breath or nausea, making good urine.    Assessment/Plan: Active Problems:   Acetabular fracture (HCC) and Pelvic fracture (HCC) - seen by Dr. Ranell PatrickNorris of orthopedics who recommends conservative therapy. PT and OT have been consulted, patient will need SNF discharge as he lives alone.    Coronary artery disease - cont home meds including Plavix restarted this AM as no surgery planned  Hypertension - continue home metoprolol, currently not at goal. Add IV hydralazine PRN.   Hyperlipidemia - continue Crestor   AKI - has been feeling dehydrated the last few days, Cr improving with hydration. Follow BMP daily   Code Status: FULL   Family Communication: None present this AM   Disposition Plan: SNF    Consultants:  Dr. Ranell PatrickNorris Ortho   Procedures:  None   Antimicrobials:  None    Objective: Filed Vitals:   11/13/15 1730 11/13/15 1830 11/13/15 2128 11/14/15 0530  BP: 133/96 177/66 125/62 169/81  Pulse: 65 81 57 54  Temp:  97.8 F (36.6 C) 98.6 F (37 C) 98.3 F (36.8 C)  TempSrc:  Oral Oral Oral  Resp: 16 16 16 16   Height:  5\' 2"  (1.575 m)    Weight:  64.411 kg (142 lb)    SpO2: 98% 98% 94% 95%    Intake/Output Summary (Last 24 hours) at 11/14/15 0730 Last data filed at 11/14/15 0531  Gross per 24 hour  Intake    790 ml  Output    475 ml  Net    315 ml   Filed Weights   11/13/15 1830  Weight: 64.411 kg (142 lb)    Exam: General:  Alert, oriented, calm, in no acute distress Eyes: pupils round and reactive to light and accomodation, clear sclerea Neck: supple, no masses, trachea mildline  Cardiovascular: RRR, no murmurs or rubs, no peripheral edema    Respiratory: clear to auscultation bilaterally, no wheezes, no crackles  Abdomen: soft, nontender, nondistended, normal bowel tones heard  Skin: dry, no rashes  Musculoskeletal: no joint effusions, normal range of motion, right leg painful with any movement, but neurologically intact Psychiatric: appropriate affect, normal speech  Neurologic: extraocular muscles intact, clear speech, moving all extremities with intact sensorium    Data Reviewed: CBC:  Recent Labs Lab 11/13/15 1623 11/14/15 0350  WBC 15.8* 11.7*  NEUTROABS 13.4*  --   HGB 13.0 12.0*  HCT 37.7* 36.5*  MCV 93.1 97.1  PLT 302 271   Basic Metabolic Panel:  Recent Labs Lab 11/13/15 1623 11/14/15 0350  NA 136 137  K 4.6 4.5  CL 103 105  CO2 28 29  GLUCOSE 116* 107*  BUN 33* 30*  CREATININE 1.97* 1.63*  CALCIUM 8.7* 8.2*   GFR: Estimated Creatinine Clearance: 23.3 mL/min (by C-G formula based on Cr of 1.63). Liver Function Tests: No results for input(s): AST, ALT, ALKPHOS, BILITOT, PROT, ALBUMIN in the last 168 hours. No results for input(s): LIPASE, AMYLASE in the last 168 hours. No results for input(s): AMMONIA in the last 168 hours. Coagulation Profile:  Recent Labs Lab 11/13/15 1623  INR 1.10   Cardiac Enzymes: No results for input(s): CKTOTAL, CKMB, CKMBINDEX, TROPONINI in the last 168 hours. BNP (last 3 results) No  results for input(s): PROBNP in the last 8760 hours. HbA1C: No results for input(s): HGBA1C in the last 72 hours. CBG: No results for input(s): GLUCAP in the last 168 hours. Lipid Profile: No results for input(s): CHOL, HDL, LDLCALC, TRIG, CHOLHDL, LDLDIRECT in the last 72 hours. Thyroid Function Tests: No results for input(s): TSH, T4TOTAL, FREET4, T3FREE, THYROIDAB in the last 72 hours. Anemia Panel: No results for input(s): VITAMINB12, FOLATE, FERRITIN, TIBC, IRON, RETICCTPCT in the last 72 hours. Urine analysis:    Component Value Date/Time   COLORURINE YELLOW  11/13/2015 2232   APPEARANCEUR CLOUDY* 11/13/2015 2232   LABSPEC 1.026 11/13/2015 2232   PHURINE 6.0 11/13/2015 2232   GLUCOSEU NEGATIVE 11/13/2015 2232   HGBUR NEGATIVE 11/13/2015 2232   BILIRUBINUR NEGATIVE 11/13/2015 2232   KETONESUR NEGATIVE 11/13/2015 2232   PROTEINUR NEGATIVE 11/13/2015 2232   UROBILINOGEN 0.2 06/15/2011 1119   NITRITE NEGATIVE 11/13/2015 2232   LEUKOCYTESUR NEGATIVE 11/13/2015 2232   Sepsis Labs: (procalcitonin:4,lacticidven:4)  )No results found for this or any previous visit (from the past 240 hour(s)).    Studies: Dg Chest 2 View  11/13/2015  CLINICAL DATA:  Pain following fall.  Hypertension. EXAM: CHEST  2 VIEW COMPARISON:  July 01, 2011 FINDINGS: There is no edema or consolidation. Heart is upper normal in size with pulmonary vascularity within normal limits. Coronary stents are noted in the left anterior descending and right coronary arteries. No adenopathy. No pneumothorax. There is arthropathy in both shoulders. There is evidence of old trauma involving the lateral left clavicle. No acute appearing fracture evident. IMPRESSION: No edema or consolidation. Cardiac stents present. No pneumothorax. Arthropathy in both shoulders with probable chronic rotator cuff tears bilaterally. Electronically Signed   By: Bretta Bang III M.D.   On: 11/13/2015 17:06   Ct Head Wo Contrast  11/13/2015  CLINICAL DATA:  Fell wall working in the yard.  Right hip pain. EXAM: CT HEAD WITHOUT CONTRAST TECHNIQUE: Contiguous axial images were obtained from the base of the skull through the vertex without intravenous contrast. COMPARISON:  08/05/2013 FINDINGS: Brain: No evidence of acute infarction, hemorrhage, extra-axial collection, ventriculomegaly, or mass effect. Generalized cerebral atrophy. Periventricular white matter low attenuation likely secondary to microangiopathy. Vascular: Cerebrovascular atherosclerotic calcifications are noted. Skull: Negative for fracture  or focal lesion. Sinuses/Orbits: Visualized portions of the orbits are unremarkable. Visualized portions of the paranasal sinuses and mastoid air cells are unremarkable. Other: None. IMPRESSION: 1. No acute intracranial pathology. 2. Chronic microvascular disease and cerebral atrophy. Electronically Signed   By: Elige Ko   On: 11/13/2015 17:05   Ct Pelvis Wo Contrast  11/13/2015  CLINICAL DATA:  Fall while working in yard. Right hip pain, unable to bear weight. Comminuted right acetabular fracture. EXAM: CT PELVIS WITHOUT CONTRAST TECHNIQUE: Multidetector CT imaging of the pelvis was performed following the standard protocol without intravenous contrast. COMPARISON:  11/13/2015 FINDINGS: There is a fracture of both columns of the right acetabulum, with several fracture planes extending through the anterior wall, a fracture plane extending through the posterior wall, a fracture plane extending up in the iliac bone, and a fracture of the inferior pubic ramus. The ischium is essentially separated away, and continuous with the quadrilateral plate segment which is medially displaced by 1.3 cm. As expected, there is adjacent hematoma in the operator internus muscle. I do not see a fracture of the left pelvic bones. No proximal femur fracture seen. Lower lumbar spondylosis and degenerative disc disease, causing left foraminal impingement at L3-4  and L4-5. Aortoiliac atherosclerotic vascular disease. Left pelvic kidney. Lobular prostate gland with thick calcification along the superior margin. I do not see findings to suggest a bladder leak. Stranding in the right sciatic notch around the sciatic nerve but without obvious impingement. IMPRESSION: 1. Fracture both columns of the right acetabulum. The quadrilateral plate segment, in continuity with the ischium, is medially displaced 1.3 cm. Adjacent hematoma in the operator internus muscle. I do not see an obvious bladder leak. 2. Left pelvic kidney. 3. Large prostate  gland with a calcification along the superior margin. 4.  Aortoiliac atherosclerotic vascular disease. 5. Lower lumbar spondylosis, scoliosis, and degenerative disc disease, with some foraminal impingement at L3-4 and L4-5. Electronically Signed   By: Gaylyn Rong M.D.   On: 11/13/2015 17:18   Dg Hip Unilat  With Pelvis 2-3 Views Right  11/13/2015  CLINICAL DATA:  Pain following fall EXAM: DG HIP (WITH OR WITHOUT PELVIS) 2-3V RIGHT COMPARISON:  None. FINDINGS: Frontal pelvis as well as frontal and lateral right hip images were obtained. There is a comminuted fracture of the right acetabulum with several separated fracture fragments. There is medial bowling in the mid acetabulum, likely due to impaction type injury in this area. Fracture extends into the midportion of the right superior pubic ramus. No other fractures are evident. No dislocation. There is mild symmetric narrowing of both hip joints. There is degenerative change in the lower lumbar spine with lower lumbar levoscoliosis. IMPRESSION: Comminuted fracture of the right acetabulum with multiple displaced fracture fragments. Note that there is medial bowing of the mid acetabulum, likely due to impaction type injury. There is fracture extending into the mid and lateral aspects of the right superior pubic ramus. Alignment in this area is near anatomic. No other fractures. No dislocations. Mild symmetric narrowing of both hip joints noted. Electronically Signed   By: Bretta Bang III M.D.   On: 11/13/2015 16:59    Scheduled Meds: . citalopram  20 mg Oral Daily  . clopidogrel  37.5 mg Oral Daily  . metoprolol tartrate  25 mg Oral BID  . rosuvastatin  5 mg Oral Daily    Continuous Infusions: . sodium chloride 75 mL/hr at 11/14/15 0003     LOS: 1 day   Time spent: 26 minutes  Mir Vergie Living, MD [facility] Pager [phone]  If 7PM-7AM, please contact night-coverage [email or web address] Password [email or web address] 11/14/2015, 7:30  AM

## 2015-11-14 NOTE — Evaluation (Signed)
Occupational Therapy Evaluation Patient Details Name: Leon Morris MRN: [id number] DOB: 05-17-1923 Today's Date: 11/14/2015    History of Present Illness s/p fall resulting in acetabulum fx. conservative tx with NWB on RLE   Clinical Impression   This 82 year old man was admitted for the above.  He will benefit from continued OT to increase safety and independence with adls.  Pt needs mod +2 assist for SPT and up to total +2 assistance for LB adls. Goals are for min to mod A in acute setting.    Follow Up Recommendations  SNF    Equipment Recommendations  3 in 1 bedside comode    Recommendations for Other Services       Precautions / Restrictions Precautions Precautions: Fall Restrictions Weight Bearing Restrictions: Yes Other Position/Activity Restrictions: NWB      Mobility Bed Mobility Overal bed mobility: Needs Assistance;+2 for physical assistance Bed Mobility: Supine to Sit     Supine to sit: Mod assist;+2 for physical assistance     General bed mobility comments: assist for RLE and trunk.  Cues to use arms to push up  Transfers Overall transfer level: Needs assistance Equipment used: Right platform walker Transfers: Sit to/from Stand;Stand Pivot Transfers Sit to Stand: Mod assist;+2 physical assistance Stand pivot transfers: Mod assist;+2 physical assistance       General transfer comment: Assist to rise and stabilize.  Cues for UE/LE placement as well as NWB.  Assist to keep RLE forward    Balance                                            ADL Overall ADL's : Needs assistance/impaired     Grooming: Set up;Sitting   Upper Body Bathing: Set up;Sitting   Lower Body Bathing: Maximal assistance;Sit to/from stand;+2 for physical assistance   Upper Body Dressing : Minimal assistance;Sitting (lines)   Lower Body Dressing: Maximal assistance;+2 for physical assistance;Sit to/from stand   Toilet Transfer: Moderate  assistance;+2 for physical assistance;Stand-pivot (to recliner)             General ADL Comments: did not introduce AE this visit  would benefit from AE for LLE.  Pt cannot clear R foot enough for AE yet.  Difficulty with NWB     Vision     Perception     Praxis      Pertinent Vitals/Pain Pain Assessment: Faces Faces Pain Scale: Hurts even more Pain Location: RLE Pain Intervention(s): Limited activity within patient's tolerance;Monitored during session;Premedicated before session;Patient requesting pain meds-RN notified     Hand Dominance     Extremity/Trunk Assessment Upper Extremity Assessment Upper Extremity Assessment: Overall WFL for tasks assessed           Communication Communication Communication: No difficulties   Cognition Arousal/Alertness: Awake/alert Behavior During Therapy: WFL for tasks assessed/performed   Area of Impairment: Following commands               General Comments: pt needed repeated cues at times; difficulty with directionality and NWB--may be due to medication   General Comments       Exercises       Shoulder Instructions      Home Living Family/patient expects to be discharged to:: Skilled nursing facility Living Arrangements: Alone  Additional Comments: has walker and high commode at home      Prior Functioning/Environment Level of Independence: Independent             OT Diagnosis: Acute pain   OT Problem List: Decreased strength;Decreased activity tolerance;Decreased knowledge of use of DME or AE;Pain (decreased cognition, likely 2* meds)   OT Treatment/Interventions: Self-care/ADL training;DME and/or AE instruction;Patient/family education;Therapeutic activities    OT Goals(Current goals can be found in the care plan section) Acute Rehab OT Goals Patient Stated Goal: decreased pain and return to being independent OT Goal Formulation: With patient Time For Goal  Achievement: 11/21/15 Potential to Achieve Goals: Good ADL Goals Pt Will Perform Lower Body Bathing: with min assist;with adaptive equipment;sit to/from stand Pt Will Perform Lower Body Dressing: with mod assist;with adaptive equipment;sit to/from stand Pt Will Transfer to Toilet: with min assist;stand pivot transfer;bedside commode Additional ADL Goal #1: pt will perform bed mobility with min A in preparation for adls  OT Frequency: Min 2X/week   Barriers to D/C: Decreased caregiver support          Co-evaluation              End of Session    Activity Tolerance: Patient limited by pain Patient left: in chair;with call bell/phone within reach;with chair alarm set;with family/visitor present   Time: [id number] OT Time Calculation (min): 26 min Charges:  OT General Charges $OT Visit: 1 Procedure OT Evaluation $OT Eval Low Complexity: 1 Procedure G-Codes:    Sherley Leser 11/14/2015, 12:28 PM  Marica OtterMaryellen Chaniqua Brisby, OTR/L [phone] 11/14/2015

## 2015-11-14 NOTE — Progress Notes (Signed)
Orthopedics Progress Note  Subjective: Patient reports pain when he tries to move the right leg.  He says he cannot lift it to position it.  Objective:  Filed Vitals:   11/13/15 2128 11/14/15 0530  BP: 125/62 169/81  Pulse: 57 54  Temp: 98.6 F (37 C) 98.3 F (36.8 C)  Resp: 16 16    General: Awake and alert  Musculoskeletal: right LE with painful AROM and normal leg length and rotation, NVI Neurovascularly intact  Lab Results  Component Value Date   WBC 11.7* 11/14/2015   HGB 12.0* 11/14/2015   HCT 36.5* 11/14/2015   MCV 97.1 11/14/2015   PLT 271 11/14/2015       Component Value Date/Time   NA 137 11/14/2015 0350   K 4.5 11/14/2015 0350   CL 105 11/14/2015 0350   CO2 29 11/14/2015 0350   GLUCOSE 107* 11/14/2015 0350   BUN 30* 11/14/2015 0350   CREATININE 1.63* 11/14/2015 0350   CALCIUM 8.2* 11/14/2015 0350   GFRNONAA 35* 11/14/2015 0350   GFRAA 41* 11/14/2015 0350    Lab Results  Component Value Date   INR 1.10 11/13/2015   INR 1.11 07/01/2011   INR 1.07 06/12/2011    Assessment/Plan: Right comminuted acetabular fracture with acceptable alignment and hip congruity.  Plan conservative management for this fracture which should do very well as long as non weight bearing status can be maintained.   Mobilization with PT Likely SNF rehab needed   Almedia BallsSteven R. Ranell PatrickNorris, MD 11/14/2015 7:29 AM

## 2015-11-14 NOTE — Care Management Note (Signed)
Case Management Note  Patient Details  Name: Leon Morris MRN: [id number] Date of Birth: 04/10/1923  Subjective/Objective:             82 yo admitted with Acetabular fx       Action/Plan: From home alone. PT is recommending SNF placement.  Expected Discharge Date:   (unknown)               Expected Discharge Plan:  Skilled Nursing Facility  In-House Referral:  Clinical Social Work  Discharge planning Services  CM Consult  Post Acute Care Choice:    Choice offered to:     DME Arranged:    DME Agency:     HH Arranged:    HH Agency:     Status of Service:  In process, will continue to follow  Medicare Important Message Given:    Date Medicare IM Given:    Medicare IM give by:    Date Additional Medicare IM Given:    Additional Medicare Important Message give by:     If discussed at Long Length of Stay Meetings, dates discussed:    Additional CommentsBartholome Bill:  Leon Denner H, RN 11/14/2015, 2:37 PM  [phone]

## 2015-11-14 NOTE — Progress Notes (Signed)
Physical Therapy Treatment Patient Details Name: UZOMA VIVONA MRN: [id number] DOB: 24-Jan-1923 Today's Date: 11/14/2015    History of Present Illness s/p fall resulting in acetabulum fx. conservative tx with NWB on RLE    PT Comments    Pt very motivated but with increased time all activities 2* pain level and increased cueing to maintain NWB on R LE.  Follow Up Recommendations  SNF     Equipment Recommendations  None recommended by PT    Recommendations for Other Services OT consult     Precautions / Restrictions Precautions Precautions: Fall Restrictions Weight Bearing Restrictions: Yes Other Position/Activity Restrictions: NWB    Mobility  Bed Mobility Overal bed mobility: Needs Assistance;+2 for physical assistance Bed Mobility: Sit to Supine     Supine to sit: Mod assist;+2 for physical assistance Sit to supine: Min assist;+2 for physical assistance   General bed mobility comments: assist for RLE and trunk.    Transfers Overall transfer level: Needs assistance Equipment used: Rolling walker (2 wheeled) Transfers: Sit to/from Stand Sit to Stand: Mod assist;+2 physical assistance Stand pivot transfers: Mod assist;+2 physical assistance       General transfer comment: Assist to rise and stabilize.  Cues for UE/LE placement as well as NWB.  Assist to keep RLE forward  Ambulation/Gait Ambulation/Gait assistance: Mod assist;+2 physical assistance;+2 safety/equipment Ambulation Distance (Feet): 1 Feet Assistive device: Rolling walker (2 wheeled) Gait Pattern/deviations: Step-to pattern;Decreased step length - right;Decreased step length - left;Shuffle;Trunk flexed     General Gait Details: cues for sequence, posture, increased UE WB, and NWB on R LE   Stairs            Wheelchair Mobility    Modified Rankin (Stroke Patients Only)       Balance Overall balance assessment: Needs assistance Sitting-balance support: No upper extremity  supported;Feet supported Sitting balance-Leahy Scale: Good     Standing balance support: Bilateral upper extremity supported Standing balance-Leahy Scale: Poor                      Cognition Arousal/Alertness: Awake/alert Behavior During Therapy: WFL for tasks assessed/performed   Area of Impairment: Following commands       Following Commands: Follows one step commands inconsistently       General Comments: pt needed repeated cues at times; difficulty with directionality and NWB--may be due to medication    Exercises General Exercises - Lower Extremity Ankle Circles/Pumps: AROM;Both;15 reps;Supine    General Comments        Pertinent Vitals/Pain Pain Assessment: Faces Faces Pain Scale: Hurts even more Pain Location: R LE Pain Descriptors / Indicators: Aching;Sore Pain Intervention(s): Limited activity within patient's tolerance;Monitored during session;Premedicated before session;Ice applied    Home Living Family/patient expects to be discharged to:: Skilled nursing facility Living Arrangements: Alone             Additional Comments: has walker and high commode at home    Prior Function Level of Independence: Independent          PT Goals (current goals can now be found in the care plan section) Acute Rehab PT Goals Patient Stated Goal: decreased pain and return to being independent PT Goal Formulation: With patient Time For Goal Achievement: 11/21/15 Potential to Achieve Goals: Fair Progress towards PT goals: Progressing toward goals    Frequency  Min 3X/week    PT Plan Current plan remains appropriate    Co-evaluation  End of Session Equipment Utilized During Treatment: Gait belt Activity Tolerance: Patient tolerated treatment well;Patient limited by pain Patient left: with call bell/phone within reach;with chair alarm set;with family/visitor present;in bed     Time: [id number] PT Time Calculation (min) (ACUTE  ONLY): 23 min  Charges:  $Gait Training: 8-22 mins $Therapeutic Activity: 8-22 mins                    G Codes:      Shevy Yaney 11/14/2015, 2:51 PM

## 2015-11-15 DIAGNOSIS — S32461A Displaced associated transverse-posterior fracture of right acetabulum, initial encounter for closed fracture: Principal | ICD-10-CM

## 2015-11-15 DIAGNOSIS — E785 Hyperlipidemia, unspecified: Secondary | ICD-10-CM

## 2015-11-15 DIAGNOSIS — I2510 Atherosclerotic heart disease of native coronary artery without angina pectoris: Secondary | ICD-10-CM

## 2015-11-15 DIAGNOSIS — I10 Essential (primary) hypertension: Secondary | ICD-10-CM

## 2015-11-15 MED ORDER — AMLODIPINE BESYLATE 5 MG PO TABS: 5.0000 mg | ORAL_TABLET | Freq: Every day | ORAL | Status: DC

## 2015-11-15 MED ORDER — PROMETHAZINE HCL 25 MG/ML IJ SOLN
12.5000 mg | Freq: Four times a day (QID) | INTRAMUSCULAR | Status: DC | PRN
  Administered 2015-11-15: 12.5 mg via INTRAVENOUS
  Filled 2015-11-15: qty 1

## 2015-11-15 MED ORDER — ACETAMINOPHEN 500 MG PO TABS: 1000.0000 mg | ORAL_TABLET | Freq: Three times a day (TID) | ORAL | Status: DC

## 2015-11-15 MED ORDER — ONDANSETRON HCL 4 MG PO TABS: 4.0000 mg | ORAL_TABLET | Freq: Four times a day (QID) | ORAL | Status: DC | PRN

## 2015-11-15 MED ORDER — POLYETHYLENE GLYCOL 3350 17 G PO PACK: 17.0000 g | PACK | Freq: Every day | ORAL | Status: DC

## 2015-11-15 MED ORDER — TRAMADOL HCL 50 MG PO TABS: 50.0000 mg | ORAL_TABLET | Freq: Four times a day (QID) | ORAL | Status: DC | PRN

## 2015-11-15 MED ORDER — ACETAMINOPHEN 500 MG PO TABS
1000.0000 mg | ORAL_TABLET | Freq: Three times a day (TID) | ORAL | Status: DC
  Administered 2015-11-15: 1000 mg via ORAL
  Filled 2015-11-15: qty 2

## 2015-11-15 MED ORDER — POLYETHYLENE GLYCOL 3350 17 G PO PACK
17.0000 g | PACK | Freq: Every day | ORAL | Status: DC
  Administered 2015-11-15: 17 g via ORAL

## 2015-11-15 MED ORDER — SENNOSIDES-DOCUSATE SODIUM 8.6-50 MG PO TABS: 1.0000 | ORAL_TABLET | Freq: Every evening | ORAL | Status: DC | PRN

## 2015-11-15 NOTE — Clinical Social Work Placement (Signed)
[facility] today. Patient & friend at bedside made aware. Discharge packet given to RN, Maralyn SagoSarah. [facility] called for transport to pickup at 2:30pm per facility's request.     [name], LCSW [facility] Clinical Social Worker cell #: [phone]    CLINICAL SOCIAL WORK PLACEMENT  NOTE  Date:  11/15/2015  Patient Details  Name: Leon Morris MRN: [id number] Date of Birth: [birth year]  Clinical Social Work is seeking post-discharge placement for this patient at the Skilled  Nursing Facility level of care (*CSW will initial, date and re-position this form in  chart as items are completed):  Yes   Patient/family provided with [facility] Clinical Social Work Department's list of facilities offering this level of care within the geographic area requested by the patient (or if unable, by the patient's family).  Yes   Patient/family informed of their freedom to choose among providers that offer the needed level of care, that participate in Medicare, Medicaid or managed care program needed by the patient, have an available bed and are willing to accept the patient.  Yes   Patient/family informed of [facility] ownership interest in [facility] and [facility], as well as of the fact that they are under no obligation to receive care at these facilities.  PASRR submitted to EDS on 11/14/15     PASRR number received on 11/14/15     Existing PASRR number confirmed on 11/14/15     FL2 transmitted to all facilities in geographic area requested by pt/family on       FL2 transmitted to all facilities within larger geographic area on       Patient informed that his/her managed care company has contracts with or will negotiate with certain facilities, including the following:        Yes   Patient/family informed of bed offers received.  [facility]     Physician recommends and  patient chooses bed at      [facility].  Patient to be transferred to facility by [facility]     Patient family notified on 11/15/15 of transfer.  Name of family member notified:  patient's friend at bedside     PHYSICIAN Please sign FL2     Additional Comment:    _______________________________________________ Arlyss RepressHarrison, Evadna Donaghy F, LCSW 11/15/2015, 11:08 AM

## 2015-11-15 NOTE — Progress Notes (Signed)
RN called report to Adele DanArtavia, LPN at [facility]. All questions answered.   Paperwork and prescriptions sent with patient and PTAR.   [facility] transported patient to SNF.

## 2015-11-15 NOTE — Progress Notes (Signed)
Subjective: Patient seen and examined this am. Reports pain to right hip. Doing well overall.  Objective: Vital signs in last 24 hours: Temp:  [98.4 F (36.9 C)] 98.4 F (36.9 C) (06/10 0457) Pulse Rate:  [57-107] 58 (06/10 0457) Resp:  [16] 16 (06/10 0457) BP: (171-189)/(64-78) 189/73 mmHg (06/10 0457) SpO2:  [96 %-100 %] 97 % (06/10 0457)  Intake/Output from previous day: 06/09 0701 - 06/10 0700 In: 2685 [P.O.:1140; I.V.:1545] Out: 1225 [Urine:1225] Intake/Output this shift:     Recent Labs  11/13/15 1623 11/14/15 0350  HGB 13.0 12.0*    Recent Labs  11/13/15 1623 11/14/15 0350  WBC 15.8* 11.7*  RBC 4.05* 3.76*  HCT 37.7* 36.5*  PLT 302 271    Recent Labs  11/13/15 1623 11/14/15 0350  NA 136 137  K 4.6 4.5  CL 103 105  CO2 28 29  BUN 33* 30*  CREATININE 1.97* 1.63*  GLUCOSE 116* 107*  CALCIUM 8.7* 8.2*    Recent Labs  11/13/15 1623  INR 1.10    Well nourished. Alert and oriented x3. RRR, Lungs clear, BS x4. Abdomen soft and non tender. Right hip pain with exam.Right Calf soft and non tender. No DVT signs. Compartment soft. No signs of infection.  Right LE grossly neurovascular intact.  Assessment/Plan: Right acetabular non displaced fracture: NWB RLE Plan for SNF when ready Up with PT Medicine following   Levern Pitter L 11/15/2015, 8:00 AM

## 2015-11-15 NOTE — Progress Notes (Signed)
Physical Therapy Treatment Patient Details Name: Leon Morris MRN: [id number] DOB: 1923/03/30 Today's Date: 11/15/2015    History of Present Illness s/p fall resulting in acetabulum fx. conservative tx with NWB on RLE    PT Comments    Assisted pt OOB to recliner required + 2 assist.  Difficulty achieving NWB, amb distance was limited.    Follow Up Recommendations  SNF     Equipment Recommendations       Recommendations for Other Services       Precautions / Restrictions Precautions Precautions: Fall Precaution Comments: impulsive/impaired safety cognition Restrictions Weight Bearing Restrictions: No Other Position/Activity Restrictions: NWB    Mobility  Bed Mobility Overal bed mobility: Needs Assistance;+2 for physical assistance Bed Mobility: Supine to Sit     Supine to sit: Mod assist;+2 for physical assistance     General bed mobility comments: assist for RLE and trunk.  utilized bed pad to complete scooting to EOB  Transfers Overall transfer level: Needs assistance Equipment used: Rolling walker (2 wheeled) Transfers: Sit to/from Stand Sit to Stand: Mod assist;+2 physical assistance         General transfer comment: Assist to rise and stabilize.  Cues for UE/LE placement as well as NWB.  Assist to keep RLE forward  Ambulation/Gait Ambulation/Gait assistance: Max assist;+2 physical assistance;+2 safety/equipment Ambulation Distance (Feet): 3 Feet Assistive device: Rolling walker (2 wheeled) Gait Pattern/deviations: Step-to pattern;Decreased stance time - right;Trunk flexed Gait velocity: decreased   General Gait Details: cues for sequence, posture, increased UE WB, and NWB on R LE.  amb distances limited by inability to maintain strict NWB   Stairs            Wheelchair Mobility    Modified Rankin (Stroke Patients Only)       Balance                                    Cognition Arousal/Alertness:  Awake/alert Behavior During Therapy: Impulsive Overall Cognitive Status: Impaired/Different from baseline                 General Comments: pt needed repeated cues at times; difficulty with directionality and NWB    Exercises      General Comments        Pertinent Vitals/Pain Pain Assessment: Faces Faces Pain Scale: Hurts even more Pain Location: R LE Pain Descriptors / Indicators: Aching;Sore;Grimacing Pain Intervention(s): Premedicated before session;Repositioned    Home Living                      Prior Function            PT Goals (current goals can now be found in the care plan section) Progress towards PT goals: Progressing toward goals    Frequency  Min 3X/week    PT Plan Current plan remains appropriate    Co-evaluation             End of Session Equipment Utilized During Treatment: Gait belt Activity Tolerance: Other (comment) Patient left: in chair;with call bell/phone within reach;with chair alarm set     Time: 9629-52841026-1042 PT Time Calculation (min) (ACUTE ONLY): 16 min  Charges:  $Gait Training: 8-22 mins                    G Codes:      Felecia ShellingLori Danilo Cappiello  PTA [facility]  Acute  Rehab Pager      [phone]

## 2015-11-15 NOTE — Discharge Summary (Signed)
PATIENT DETAILS Name: Leon Morris Age: 82 y.o. Sex: male Date of Birth: December 04, 1922 MRN: [id number]. Admitting Physician: Mir Vergie Living, MD WUJ:[id number] NEVILL, MD  Admit Date: 11/13/2015 Discharge date: 11/15/2015  Recommendations for Outpatient Follow-up:  1. Please and show follow-up with orthopedics-Dr. Ranell Patrick 2. Please repeat CBC/BMET in 1 week  PRIMARY DISCHARGE DIAGNOSIS:  Active Problems:   Coronary artery disease   Hypertension   Hyperlipidemia   Acetabular fracture (HCC)   Pelvic fracture (HCC)      PAST MEDICAL HISTORY: Past Medical History  Diagnosis Date  . Anxiety   . Hypercholesterolemia   . Hypertension 06/16/2011  . NSTEMI (non-ST elevated myocardial infarction) (HCC)     06/16/2011   DR [place]   . Arthritis     LOWER BACK PAIN   . CAD (coronary artery disease)     DISCHARGE MEDICATIONS: Current Discharge Medication List    START taking these medications   Details  acetaminophen (TYLENOL) 500 MG tablet Take 2 tablets (1,000 mg total) by mouth every 8 (eight) hours. Qty: 30 tablet, Refills: 0    amLODipine (NORVASC) 5 MG tablet Take 1 tablet (5 mg total) by mouth daily.    ondansetron (ZOFRAN) 4 MG tablet Take 1 tablet (4 mg total) by mouth every 6 (six) hours as needed for nausea. Qty: 20 tablet, Refills: 0    polyethylene glycol (MIRALAX / GLYCOLAX) packet Take 17 g by mouth daily. Qty: 14 each, Refills: 0    senna-docusate (SENOKOT-S) 8.6-50 MG tablet Take 1 tablet by mouth at bedtime as needed for mild constipation.    traMADol (ULTRAM) 50 MG tablet Take 1 tablet (50 mg total) by mouth every 6 (six) hours as needed for moderate pain. Qty: 30 tablet, Refills: 0      CONTINUE these medications which have NOT CHANGED   Details  citalopram (CELEXA) 20 MG tablet Take 20 mg by mouth daily.     clopidogrel (PLAVIX) 75 MG tablet TAKE 1/2 TABLET BY MOUTH EVERY DAY WITH BREAKFAST Qty: 30 tablet, Refills: 8    metoprolol  tartrate (LOPRESSOR) 25 MG tablet TAKE 1 TABLET BY MOUTH TWICE A DAY Qty: 60 tablet, Refills: 1    rosuvastatin (CRESTOR) 5 MG tablet Take 5 mg by mouth daily.    nitroGLYCERIN (NITROSTAT) 0.4 MG SL tablet Place 0.4 mg under the tongue every 5 (five) minutes as needed for chest pain (UP TO 3 DOSES BEFORE CALLING 911).      STOP taking these medications     Naproxen Sodium (ALEVE PO)         ALLERGIES:  No Known Allergies  BRIEF HPI:  See H&P, Labs, Consult and Test reports for all details in brief, patient was admitted Following a mechanical fall, x-rays showed a right acetabular fracture.  CONSULTATIONS:   orthopedic surgery  PERTINENT RADIOLOGIC STUDIES: Dg Chest 2 View  11/13/2015  CLINICAL DATA:  Pain following fall.  Hypertension. EXAM: CHEST  2 VIEW COMPARISON:  July 01, 2011 FINDINGS: There is no edema or consolidation. Heart is upper normal in size with pulmonary vascularity within normal limits. Coronary stents are noted in the left anterior descending and right coronary arteries. No adenopathy. No pneumothorax. There is arthropathy in both shoulders. There is evidence of old trauma involving the lateral left clavicle. No acute appearing fracture evident. IMPRESSION: No edema or consolidation. Cardiac stents present. No pneumothorax. Arthropathy in both shoulders with probable chronic rotator cuff tears bilaterally. Electronically Signed   By: Chrissie Noa  Margarita GrizzleWoodruff III M.D.   On: 11/13/2015 17:06   Ct Head Wo Contrast  11/13/2015  CLINICAL DATA:  Fell wall working in the yard.  Right hip pain. EXAM: CT HEAD WITHOUT CONTRAST TECHNIQUE: Contiguous axial images were obtained from the base of the skull through the vertex without intravenous contrast. COMPARISON:  08/05/2013 FINDINGS: Brain: No evidence of acute infarction, hemorrhage, extra-axial collection, ventriculomegaly, or mass effect. Generalized cerebral atrophy. Periventricular white matter low attenuation likely secondary to  microangiopathy. Vascular: Cerebrovascular atherosclerotic calcifications are noted. Skull: Negative for fracture or focal lesion. Sinuses/Orbits: Visualized portions of the orbits are unremarkable. Visualized portions of the paranasal sinuses and mastoid air cells are unremarkable. Other: None. IMPRESSION: 1. No acute intracranial pathology. 2. Chronic microvascular disease and cerebral atrophy. Electronically Signed   By: Elige KoHetal  Patel   On: 11/13/2015 17:05   Ct Pelvis Wo Contrast  11/13/2015  CLINICAL DATA:  Fall while working in yard. Right hip pain, unable to bear weight. Comminuted right acetabular fracture. EXAM: CT PELVIS WITHOUT CONTRAST TECHNIQUE: Multidetector CT imaging of the pelvis was performed following the standard protocol without intravenous contrast. COMPARISON:  11/13/2015 FINDINGS: There is a fracture of both columns of the right acetabulum, with several fracture planes extending through the anterior wall, a fracture plane extending through the posterior wall, a fracture plane extending up in the iliac bone, and a fracture of the inferior pubic ramus. The ischium is essentially separated away, and continuous with the quadrilateral plate segment which is medially displaced by 1.3 cm. As expected, there is adjacent hematoma in the operator internus muscle. I do not see a fracture of the left pelvic bones. No proximal femur fracture seen. Lower lumbar spondylosis and degenerative disc disease, causing left foraminal impingement at L3-4 and L4-5. Aortoiliac atherosclerotic vascular disease. Left pelvic kidney. Lobular prostate gland with thick calcification along the superior margin. I do not see findings to suggest a bladder leak. Stranding in the right sciatic notch around the sciatic nerve but without obvious impingement. IMPRESSION: 1. Fracture both columns of the right acetabulum. The quadrilateral plate segment, in continuity with the ischium, is medially displaced 1.3 cm. Adjacent hematoma  in the operator internus muscle. I do not see an obvious bladder leak. 2. Left pelvic kidney. 3. Large prostate gland with a calcification along the superior margin. 4.  Aortoiliac atherosclerotic vascular disease. 5. Lower lumbar spondylosis, scoliosis, and degenerative disc disease, with some foraminal impingement at L3-4 and L4-5. Electronically Signed   By: Gaylyn RongWalter  Liebkemann M.D.   On: 11/13/2015 17:18   Dg Hip Unilat  With Pelvis 2-3 Views Right  11/13/2015  CLINICAL DATA:  Pain following fall EXAM: DG HIP (WITH OR WITHOUT PELVIS) 2-3V RIGHT COMPARISON:  None. FINDINGS: Frontal pelvis as well as frontal and lateral right hip images were obtained. There is a comminuted fracture of the right acetabulum with several separated fracture fragments. There is medial bowling in the mid acetabulum, likely due to impaction type injury in this area. Fracture extends into the midportion of the right superior pubic ramus. No other fractures are evident. No dislocation. There is mild symmetric narrowing of both hip joints. There is degenerative change in the lower lumbar spine with lower lumbar levoscoliosis. IMPRESSION: Comminuted fracture of the right acetabulum with multiple displaced fracture fragments. Note that there is medial bowing of the mid acetabulum, likely due to impaction type injury. There is fracture extending into the mid and lateral aspects of the right superior pubic ramus. Alignment in this  area is near anatomic. No other fractures. No dislocations. Mild symmetric narrowing of both hip joints noted. Electronically Signed   By: Bretta Bang III M.D.   On: 11/13/2015 16:59     PERTINENT LAB RESULTS: CBC:  Recent Labs  11/13/15 1623 11/14/15 0350  WBC 15.8* 11.7*  HGB 13.0 12.0*  HCT 37.7* 36.5*  PLT 302 271   CMET CMP     Component Value Date/Time   NA 137 11/14/2015 0350   K 4.5 11/14/2015 0350   CL 105 11/14/2015 0350   CO2 29 11/14/2015 0350   GLUCOSE 107* 11/14/2015 0350     BUN 30* 11/14/2015 0350   CREATININE 1.63* 11/14/2015 0350   CALCIUM 8.2* 11/14/2015 0350   PROT 6.6 08/04/2011 0914   ALBUMIN 3.7 08/04/2011 0914   AST 24 08/04/2011 0914   ALT 17 08/04/2011 0914   ALKPHOS 73 08/04/2011 0914   BILITOT 0.8 08/04/2011 0914   GFRNONAA 35* 11/14/2015 0350   GFRAA 41* 11/14/2015 0350    GFR Estimated Creatinine Clearance: 23.3 mL/min (by C-G formula based on Cr of 1.63). No results for input(s): LIPASE, AMYLASE in the last 72 hours. No results for input(s): CKTOTAL, CKMB, CKMBINDEX, TROPONINI in the last 72 hours. Invalid input(s): POCBNP No results for input(s): DDIMER in the last 72 hours. No results for input(s): HGBA1C in the last 72 hours. No results for input(s): CHOL, HDL, LDLCALC, TRIG, CHOLHDL, LDLDIRECT in the last 72 hours. No results for input(s): TSH, T4TOTAL, T3FREE, THYROIDAB in the last 72 hours.  Invalid input(s): FREET3 No results for input(s): VITAMINB12, FOLATE, FERRITIN, TIBC, IRON, RETICCTPCT in the last 72 hours. Coags:  Recent Labs  11/13/15 1623  INR 1.10   Microbiology: No results found for this or any previous visit (from the past 240 hour(s)).   BRIEF HOSPITAL COURSE:  Acetabular fracture (HCC) and Pelvic fracture (HCC): Following a mechanical fall.Seen by Dr. Ranell Patrick of orthopedics who recommends conservative therapy.recommendations are to be nonweightbearing to the right lower extremity. PT and OT have been consulted, recommendations are to discharge to SNF when bed available. Apparently gets somewhat nauseous with oxycodone, will place him on scheduled Tylenol, and use tramadol for breakthrough pain. We'll also put him on a bowel regimen with MiraLAX, and as needed Senokot.  Acute on chronic kidney disease stage III: Acute kidney injury likely mild prerenal azotemia due to dehydration. Patient was also noted to be on NSAIDs-he has been asked to avoid NSAIDs. His creatinine by the day of discharge had come down and  is very close to his usual baseline. These continue to follow closely in the outpatient setting.   Coronary artery disease: Stable without any chest pain or shortness of breath. Continue Plavix, metoprolol and statin.  Hypertension: Fluctuating but mostly uncontrolled-continue metoprolol, and amlodipine.   Hyperlipidemia - continue Crestor  TODAY-DAY OF DISCHARGE:  Subjective:   Harland German today has no headache,no chest abdominal pain,no new weakness tingling or numbness, feels much better wants to go home today.   Objective:   Blood pressure 172/63, pulse 60, temperature 98.4 F (36.9 C), temperature source Oral, resp. rate 16, height  (1.575 m), weight 64.411 kg (142 lb), SpO2 97 %.  Intake/Output Summary (Last 24 hours) at 11/15/15 0916 Last data filed at 11/15/15 0818  Gross per 24 hour  Intake   2445 ml  Output   1125 ml  Net   1320 ml   Filed Weights   11/13/15 1830  Weight: 64.411  kg (142 lb)    Exam Awake Alert, Oriented *3, No new F.N deficits, Normal affect [place].AT,PERRAL Supple Neck,No JVD, No cervical lymphadenopathy appriciated.  Symmetrical Chest wall movement, Good air movement bilaterally, CTAB RRR,No Gallops,Rubs or new Murmurs, No Parasternal Heave +ve B.Sounds, Abd Soft, Non tender, No organomegaly appriciated, No rebound -guarding or rigidity. No Cyanosis, Clubbing or edema, No new Rash or bruise  DISCHARGE CONDITION: Stable  DISPOSITION: SNF  DISCHARGE INSTRUCTIONS:    Activity:  Strict NWB right LE, pivot transfers on left  Get Medicines reviewed and adjusted: Please take all your medications with you for your next visit with your Primary MD  Please request your Primary MD to go over all hospital tests and procedure/radiological results at the follow up, please ask your Primary MD to get all Hospital records sent to his/her office.  If you experience worsening of your admission symptoms, develop shortness of breath, life  threatening emergency, suicidal or homicidal thoughts you must seek medical attention immediately by calling 911 or calling your MD immediately  if symptoms less severe.  You must read complete instructions/literature along with all the possible adverse reactions/side effects for all the Medicines you take and that have been prescribed to you. Take any new Medicines after you have completely understood and accpet all the possible adverse reactions/side effects.   Do not drive when taking Pain medications.   Do not take more than prescribed Pain, Sleep and Anxiety Medications  Special Instructions: If you have smoked or chewed Tobacco  in the last 2 yrs please stop smoking, stop any regular Alcohol  and or any Recreational drug use.  Wear Seat belts while driving.  Please note  You were cared for by a hospitalist during your hospital stay. Once you are discharged, your primary care physician will handle any further medical issues. Please note that NO REFILLS for any discharge medications will be authorized once you are discharged, as it is imperative that you return to your primary care physician (or establish a relationship with a primary care physician if you do not have one) for your aftercare needs so that they can reassess your need for medications and monitor your lab values.  Diet recommendation: Heart Healthy diet  Discharge Instructions    Diet - low sodium heart healthy    Complete by:  As directed      Discharge instructions    Complete by:  As directed   Strict NWB right LE, pivot transfers on left     Increase activity slowly    Complete by:  As directed            Follow-up Information    Follow up with GATES,ROBERT NEVILL, MD. Schedule an appointment as soon as possible for a visit in 2 weeks.   Specialty:  Internal Medicine   Why:  Hospital follow up   Contact information:   [address] [place] [address] [phone]       Follow up with  Verlee Rossetti, MD. Schedule an appointment as soon as possible for a visit in 2 weeks.   Specialty:  Orthopedic Surgery   Why:  Hospital follow up   Contact information:   [address] [place] [address] [phone]       Total Time spent on discharge equals  45 minutes.  SignedJeoffrey Massed 11/15/2015 9:16 AM

## 2015-11-25 ENCOUNTER — Encounter: Payer: Self-pay | Admitting: Interventional Cardiology

## 2015-12-18 ENCOUNTER — Emergency Department (HOSPITAL_COMMUNITY)
Admission: EM | Admit: 2015-12-18 | Discharge: 2015-12-18 | Disposition: A | Discharge status: Home / Self Care | Payer: Medicare Other | Attending: Emergency Medicine | Admitting: Emergency Medicine

## 2015-12-18 ENCOUNTER — Emergency Department (HOSPITAL_COMMUNITY): Payer: Medicare Other

## 2015-12-18 DIAGNOSIS — Z87891 Personal history of nicotine dependence: Secondary | ICD-10-CM | POA: Diagnosis not present

## 2015-12-18 DIAGNOSIS — Y939 Activity, unspecified: Secondary | ICD-10-CM | POA: Insufficient documentation

## 2015-12-18 DIAGNOSIS — Y999 Unspecified external cause status: Secondary | ICD-10-CM | POA: Insufficient documentation

## 2015-12-18 DIAGNOSIS — M25551 Pain in right hip: Secondary | ICD-10-CM | POA: Diagnosis not present

## 2015-12-18 DIAGNOSIS — I2510 Atherosclerotic heart disease of native coronary artery without angina pectoris: Secondary | ICD-10-CM | POA: Diagnosis not present

## 2015-12-18 DIAGNOSIS — Y929 Unspecified place or not applicable: Secondary | ICD-10-CM | POA: Insufficient documentation

## 2015-12-18 DIAGNOSIS — I10 Essential (primary) hypertension: Secondary | ICD-10-CM | POA: Diagnosis not present

## 2015-12-18 DIAGNOSIS — S0990XA Unspecified injury of head, initial encounter: Secondary | ICD-10-CM | POA: Insufficient documentation

## 2015-12-18 DIAGNOSIS — M1990 Unspecified osteoarthritis, unspecified site: Secondary | ICD-10-CM | POA: Insufficient documentation

## 2015-12-18 DIAGNOSIS — W06XXXA Fall from bed, initial encounter: Secondary | ICD-10-CM | POA: Insufficient documentation

## 2015-12-18 DIAGNOSIS — W19XXXA Unspecified fall, initial encounter: Secondary | ICD-10-CM

## 2015-12-18 NOTE — Discharge Instructions (Signed)
Keep all scheduled appointments with your primary care physician and orthopedics.  Return to ER for any new or worsening symptoms, any additional concerns.

## 2015-12-18 NOTE — ED Provider Notes (Signed)
CSN: [id number]     Arrival date & time 12/18/15  [id number] History   First MD Initiated Contact with Patient 12/18/15 [phone]     Chief Complaint  Patient presents with  . Fall    (Consider location/radiation/quality/duration/timing/severity/associated sxs/prior Treatment) Patient is a 82 y.o. male presenting with fall. The history is provided by the patient and medical records. No language interpreter was used.  Fall Associated symptoms include arthralgias (Right hip pain, no new change). Pertinent negatives include no abdominal pain, chills, congestion, coughing, fever, headaches, nausea or vomiting.    Leon Morris is a 82 y.o. male  with a PMH of CAD, prior NSTEMI on plavix who presents to the Emergency Department after a fall just PTA. Patient states he was trying to move from bed to his wheelchair when he lost his balance and fell on his buttocks, then hitting head. Denies LOC, n/v, abdominal pain, chest pain, sob, or any other new areas of pain. Denies open wounds or bleeding. No meds or treatments PTA for symptoms. Of note, patient was seen approx. 1 month ago and dx with right hip fx which was not surgically repaired. Patient states he still has pain in this area, but no change from baseline. Ever since fall, patient uses wheelchair for ambulation at baseline.    Past Medical History  Diagnosis Date  . Anxiety   . Hypercholesterolemia   . Hypertension 06/16/2011  . NSTEMI (non-ST elevated myocardial infarction) (HCC)     06/16/2011   DR [place]   . Arthritis     LOWER BACK PAIN   . CAD (coronary artery disease)    Past Surgical History  Procedure Laterality Date  . Knee surgery    . Shoulder surgery    . Hernia repair    . Joint replacement    . Cardiac catheterization    . Coronary angioplasty      06/2011   DR [place]   . Cataract extraction w/ intraocular lens  implant, bilateral    . Left heart catheterization with coronary angiogram N/A 06/13/2011    Procedure: LEFT HEART  CATHETERIZATION WITH CORONARY ANGIOGRAM;  Surgeon: Peter M [place], MD;  [facility];  Service: Cardiovascular;  Laterality: N/A;   Family History  Problem Relation Age of Onset  . Lung cancer Father   . CAD Father   . CAD Brother   . Heart attack Neg Hx   . Hypertension Neg Hx   . Stroke Neg Hx    Social History  Substance Use Topics  . Smoking status: Former Smoker    Quit date: 05/18/1941  . Smokeless tobacco: Never Used  . Alcohol Use: No    Review of Systems  Constitutional: Negative for fever and chills.  HENT: Negative for congestion.   Eyes: Negative for visual disturbance.  Respiratory: Negative for cough and shortness of breath.   Cardiovascular: Negative.   Gastrointestinal: Negative for nausea, vomiting and abdominal pain.  Genitourinary: Negative for dysuria.  Musculoskeletal: Positive for arthralgias (Right hip pain, no new change).  Skin: Negative for color change and wound.  Neurological: Negative for syncope and headaches.      Allergies  Review of patient's allergies indicates no known allergies.  Home Medications   Prior to Admission medications   Medication Sig Start Date End Date Taking? Authorizing Provider  acetaminophen (TYLENOL) 500 MG tablet Take 2 tablets (1,000 mg total) by mouth every 8 (eight) hours. 11/15/15   Shanker Levora DredgeM Ghimire, MD  amLODipine (  NORVASC) 5 MG tablet Take 1 tablet (5 mg total) by mouth daily. 11/15/15   Shanker Levora Dredge, MD  citalopram (CELEXA) 20 MG tablet Take 20 mg by mouth daily.     Historical Provider, MD  clopidogrel (PLAVIX) 75 MG tablet TAKE 1/2 TABLET BY MOUTH EVERY DAY WITH BREAKFAST 10/06/15   Corky Crafts, MD  metoprolol tartrate (LOPRESSOR) 25 MG tablet TAKE 1 TABLET BY MOUTH TWICE A DAY 09/04/15   Corky Crafts, MD  nitroGLYCERIN (NITROSTAT) 0.4 MG SL tablet Place 0.4 mg under the tongue every 5 (five) minutes as needed for chest pain (UP TO 3 DOSES BEFORE CALLING 911).    Historical Provider,  MD  ondansetron (ZOFRAN) 4 MG tablet Take 1 tablet (4 mg total) by mouth every 6 (six) hours as needed for nausea. 11/15/15   Shanker Levora Dredge, MD  polyethylene glycol (MIRALAX / GLYCOLAX) packet Take 17 g by mouth daily. 11/15/15   Shanker Levora Dredge, MD  rosuvastatin (CRESTOR) 5 MG tablet Take 5 mg by mouth daily.    Historical Provider, MD  senna-docusate (SENOKOT-S) 8.6-50 MG tablet Take 1 tablet by mouth at bedtime as needed for mild constipation. 11/15/15   Shanker Levora Dredge, MD  traMADol (ULTRAM) 50 MG tablet Take 1 tablet (50 mg total) by mouth every 6 (six) hours as needed for moderate pain. 11/15/15   Shanker Levora Dredge, MD   BP 122/102 mmHg  Pulse 62  Temp(Src) 97.9 F (36.6 C) (Oral)  Resp 18  SpO2 96% Physical Exam  Constitutional: He is oriented to person, place, and time. He appears well-developed and well-nourished. No distress.  HENT:  Head: Normocephalic.  No signs of trauma.  Cardiovascular: Normal rate, regular rhythm, normal heart sounds and intact distal pulses.  Exam reveals no gallop and no friction rub.   No murmur heard. Pulmonary/Chest: Effort normal and breath sounds normal. No respiratory distress. He has no wheezes. He has no rales. He exhibits no tenderness.  Abdominal: Soft. Bowel sounds are normal. He exhibits no distension. There is no tenderness.  Musculoskeletal: He exhibits no edema.  TTP of right hip area which patient states is baseline 2/2 prior hip fx 1 month ago. No overlying skin changes. 5/5 muscle strength in upper and lower extremities bilaterally including strong and equal grip strength and dorsiflexion/plantar flexion. Decreased ROM of RLE 2/2 pain.   Neurological: He is alert and oriented to person, place, and time.  Alert, oriented, thought content appropriate, able to give a coherent history. Speech is clear and goal oriented, able to follow commands.  Cranial Nerves:  II:  Peripheral visual fields grossly normal, pupils equal, round, reactive  to light III, IV, VI: EOM intact bilaterally, ptosis not present V,VII: smile symmetric, eyes kept closed tightly against resistance, facial light touch sensation equal VIII: hearing grossly normal IX, X: symmetric soft palate movement, uvula elevates symmetrically  XI: bilateral shoulder shrug symmetric and strong XII: midline tongue extension Sensory to light touch normal in all four extremities.  Normal finger-to-nose and rapid alternating movements.  Skin: Skin is warm and dry.  Nursing note and vitals reviewed.   ED Course  Procedures (including critical care time) Labs Review Labs Reviewed - No data to display  Imaging Review Ct Head Wo Contrast  12/18/2015  CLINICAL DATA:  82 year old male with acute headache following fall and head injury today. Initial encounter. EXAM: CT HEAD WITHOUT CONTRAST TECHNIQUE: Contiguous axial images were obtained from the base of the skull through  the vertex without intravenous contrast. COMPARISON:  11/13/2015 and 08/05/2013 head CTs FINDINGS: Atrophy and chronic small-vessel white matter ischemic changes are again identified. No acute intracranial abnormalities are identified, including mass lesion or mass effect, hydrocephalus, extra-axial fluid collection, midline shift, hemorrhage, or acute infarction. The visualized bony calvarium is unremarkable. IMPRESSION: No evidence of acute intracranial abnormality. Atrophy chronic small-vessel white matter ischemic changes. Electronically Signed   By: Harmon Pier M.D.   On: 12/18/2015 10:25   Dg Hip Unilat With Pelvis 2-3 Views Right  12/18/2015  CLINICAL DATA:  Fall this morning with right hip pain. Initial encounter. EXAM: DG HIP (WITH OR WITHOUT PELVIS) 2-3V RIGHT COMPARISON:  11/13/2015. FINDINGS: Frontal pelvis again shows comminuted acetabular fracture with stable position of fracture fragments. Degenerative changes noted lower lumbar spine. AP and frog-leg lateral views of the right hip show no femoral  neck fracture. IMPRESSION: Stable appearance of comminuted right acetabular fracture. Electronically Signed   By: Kennith Center M.D.   On: 12/18/2015 10:31   I have personally reviewed and evaluated these images and lab results as part of my medical decision-making.   EKG Interpretation None      MDM   Final diagnoses:  Fall, initial encounter   Leon Morris is a 82 y.o. male who presents to ED after mechanical fall while trying to transfer to his wheelchair. On plavix. No focal neuro deficits on exam. Prior right hip fx approx. 1 month ago - no new change in pain. No overlying skin changes. Will still image hip to ensure stability. CT head ordered.   Imaging: Hip films with stable fracture which appears unchanged. CT head with no acute abnormality.  Evaluation does not show pathology that would require ongoing emergent intervention or inpatient treatment. Patient is hemodynamically stable and mentating appropriately. Discussed findings and plan with patient. PCP follow up strongly encourage. Keep all scheduled ortho appointments. Return precautions discussed and all questions answered.   Patient seen by and discussed with Dr. Rhunette Croft who agrees with treatment plan.   [facility], PA-C 12/18/15 1137  Derwood Kaplan, MD 12/18/15 1248

## 2015-12-18 NOTE — ED Notes (Signed)
Bed: WA07 Expected date:  Expected time:  Means of arrival:  Comments: EMS- 82yo M, fall/head injury/blood thinners

## 2015-12-18 NOTE — ED Notes (Signed)
Per EMS, patient fell onto bottom at home today. Patient reports hitting head. No injuries/trauma/pain noted. Patient is on Plavix.

## 2015-12-18 NOTE — ED Notes (Signed)
Discharge instructions and follow up care reviewed with patient. Patient verbalized understanding. 

## 2015-12-29 ENCOUNTER — Other Ambulatory Visit: Payer: Self-pay | Admitting: *Deleted

## 2015-12-29 MED ORDER — ROSUVASTATIN CALCIUM 5 MG PO TABS: 5.0000 mg | ORAL_TABLET | Freq: Every day | ORAL | 2 refills | Status: AC

## 2016-02-05 ENCOUNTER — Encounter: Payer: Self-pay | Admitting: Interventional Cardiology

## 2016-10-05 ENCOUNTER — Encounter: Payer: Self-pay | Admitting: Interventional Cardiology

## 2016-10-21 ENCOUNTER — Ambulatory Visit: Payer: Medicare Other | Admitting: Interventional Cardiology

## 2016-10-25 ENCOUNTER — Encounter: Payer: Self-pay | Admitting: Interventional Cardiology

## 2016-12-06 ENCOUNTER — Ambulatory Visit: Payer: Medicare Other | Admitting: Interventional Cardiology

## 2016-12-07 ENCOUNTER — Encounter: Payer: Self-pay | Admitting: Interventional Cardiology

## 2016-12-24 DIAGNOSIS — H353211 Exudative age-related macular degeneration, right eye, with active choroidal neovascularization: Secondary | ICD-10-CM | POA: Diagnosis not present

## 2017-01-25 DIAGNOSIS — H353122 Nonexudative age-related macular degeneration, left eye, intermediate dry stage: Secondary | ICD-10-CM | POA: Diagnosis not present

## 2017-01-25 DIAGNOSIS — H353112 Nonexudative age-related macular degeneration, right eye, intermediate dry stage: Secondary | ICD-10-CM | POA: Diagnosis not present

## 2017-01-25 DIAGNOSIS — H353211 Exudative age-related macular degeneration, right eye, with active choroidal neovascularization: Secondary | ICD-10-CM | POA: Diagnosis not present

## 2017-01-25 DIAGNOSIS — H3561 Retinal hemorrhage, right eye: Secondary | ICD-10-CM | POA: Diagnosis not present

## 2017-03-01 DIAGNOSIS — H353112 Nonexudative age-related macular degeneration, right eye, intermediate dry stage: Secondary | ICD-10-CM | POA: Diagnosis not present

## 2017-03-01 DIAGNOSIS — H353211 Exudative age-related macular degeneration, right eye, with active choroidal neovascularization: Secondary | ICD-10-CM | POA: Diagnosis not present

## 2017-03-01 DIAGNOSIS — H3561 Retinal hemorrhage, right eye: Secondary | ICD-10-CM | POA: Diagnosis not present

## 2017-03-01 DIAGNOSIS — H353122 Nonexudative age-related macular degeneration, left eye, intermediate dry stage: Secondary | ICD-10-CM | POA: Diagnosis not present

## 2017-04-12 DIAGNOSIS — H353112 Nonexudative age-related macular degeneration, right eye, intermediate dry stage: Secondary | ICD-10-CM | POA: Diagnosis not present

## 2017-04-12 DIAGNOSIS — H353132 Nonexudative age-related macular degeneration, bilateral, intermediate dry stage: Secondary | ICD-10-CM | POA: Diagnosis not present

## 2017-04-12 DIAGNOSIS — H353122 Nonexudative age-related macular degeneration, left eye, intermediate dry stage: Secondary | ICD-10-CM | POA: Diagnosis not present

## 2017-04-12 DIAGNOSIS — H353211 Exudative age-related macular degeneration, right eye, with active choroidal neovascularization: Secondary | ICD-10-CM | POA: Diagnosis not present

## 2017-04-12 DIAGNOSIS — H3561 Retinal hemorrhage, right eye: Secondary | ICD-10-CM | POA: Diagnosis not present

## 2017-05-20 DIAGNOSIS — I10 Essential (primary) hypertension: Secondary | ICD-10-CM | POA: Diagnosis not present

## 2017-05-20 DIAGNOSIS — M810 Age-related osteoporosis without current pathological fracture: Secondary | ICD-10-CM | POA: Diagnosis not present

## 2017-05-20 DIAGNOSIS — I2510 Atherosclerotic heart disease of native coronary artery without angina pectoris: Secondary | ICD-10-CM | POA: Diagnosis not present

## 2017-05-20 DIAGNOSIS — E785 Hyperlipidemia, unspecified: Secondary | ICD-10-CM | POA: Diagnosis not present

## 2017-05-20 DIAGNOSIS — N189 Chronic kidney disease, unspecified: Secondary | ICD-10-CM | POA: Diagnosis not present

## 2017-06-02 DIAGNOSIS — H353112 Nonexudative age-related macular degeneration, right eye, intermediate dry stage: Secondary | ICD-10-CM | POA: Diagnosis not present

## 2017-06-02 DIAGNOSIS — H353132 Nonexudative age-related macular degeneration, bilateral, intermediate dry stage: Secondary | ICD-10-CM | POA: Diagnosis not present

## 2017-06-02 DIAGNOSIS — H353211 Exudative age-related macular degeneration, right eye, with active choroidal neovascularization: Secondary | ICD-10-CM | POA: Diagnosis not present

## 2017-06-02 DIAGNOSIS — H353122 Nonexudative age-related macular degeneration, left eye, intermediate dry stage: Secondary | ICD-10-CM | POA: Diagnosis not present

## 2017-06-09 DIAGNOSIS — H5201 Hypermetropia, right eye: Secondary | ICD-10-CM | POA: Diagnosis not present

## 2017-06-09 DIAGNOSIS — H52221 Regular astigmatism, right eye: Secondary | ICD-10-CM | POA: Diagnosis not present

## 2017-06-09 DIAGNOSIS — H04123 Dry eye syndrome of bilateral lacrimal glands: Secondary | ICD-10-CM | POA: Diagnosis not present

## 2017-06-09 DIAGNOSIS — H353122 Nonexudative age-related macular degeneration, left eye, intermediate dry stage: Secondary | ICD-10-CM | POA: Diagnosis not present

## 2017-06-09 DIAGNOSIS — H353211 Exudative age-related macular degeneration, right eye, with active choroidal neovascularization: Secondary | ICD-10-CM | POA: Diagnosis not present

## 2017-06-09 DIAGNOSIS — H524 Presbyopia: Secondary | ICD-10-CM | POA: Diagnosis not present

## 2017-06-09 DIAGNOSIS — H5212 Myopia, left eye: Secondary | ICD-10-CM | POA: Diagnosis not present

## 2017-07-26 DIAGNOSIS — H3561 Retinal hemorrhage, right eye: Secondary | ICD-10-CM | POA: Diagnosis not present

## 2017-07-26 DIAGNOSIS — H353211 Exudative age-related macular degeneration, right eye, with active choroidal neovascularization: Secondary | ICD-10-CM | POA: Diagnosis not present

## 2017-07-26 DIAGNOSIS — H353132 Nonexudative age-related macular degeneration, bilateral, intermediate dry stage: Secondary | ICD-10-CM | POA: Diagnosis not present

## 2017-10-04 DIAGNOSIS — H353132 Nonexudative age-related macular degeneration, bilateral, intermediate dry stage: Secondary | ICD-10-CM | POA: Diagnosis not present

## 2017-10-04 DIAGNOSIS — H3561 Retinal hemorrhage, right eye: Secondary | ICD-10-CM | POA: Diagnosis not present

## 2017-10-04 DIAGNOSIS — H353211 Exudative age-related macular degeneration, right eye, with active choroidal neovascularization: Secondary | ICD-10-CM | POA: Diagnosis not present

## 2017-10-19 ENCOUNTER — Telehealth: Payer: Self-pay | Admitting: Interventional Cardiology

## 2017-10-19 DIAGNOSIS — I10 Essential (primary) hypertension: Secondary | ICD-10-CM | POA: Diagnosis not present

## 2017-10-19 DIAGNOSIS — Z136 Encounter for screening for cardiovascular disorders: Secondary | ICD-10-CM | POA: Diagnosis not present

## 2017-10-19 DIAGNOSIS — Z0000 Encounter for general adult medical examination without abnormal findings: Secondary | ICD-10-CM | POA: Diagnosis not present

## 2017-10-19 DIAGNOSIS — I2510 Atherosclerotic heart disease of native coronary artery without angina pectoris: Secondary | ICD-10-CM | POA: Diagnosis not present

## 2017-10-19 DIAGNOSIS — F418 Other specified anxiety disorders: Secondary | ICD-10-CM | POA: Diagnosis not present

## 2017-10-19 DIAGNOSIS — N189 Chronic kidney disease, unspecified: Secondary | ICD-10-CM | POA: Diagnosis not present

## 2017-10-19 DIAGNOSIS — E785 Hyperlipidemia, unspecified: Secondary | ICD-10-CM | POA: Diagnosis not present

## 2017-10-19 DIAGNOSIS — M810 Age-related osteoporosis without current pathological fracture: Secondary | ICD-10-CM | POA: Diagnosis not present

## 2017-10-19 NOTE — Telephone Encounter (Signed)
OK with me.

## 2017-10-19 NOTE — Telephone Encounter (Signed)
Is okay with me 

## 2017-10-19 NOTE — Telephone Encounter (Signed)
New Message   Tammy with [facility] at [facility] is calling on behalf of patient. She states that patient wants to switch providers. Would like to go from Dr. Eldridge Dace to Dr. Allyson Sabal. Please advise

## 2017-10-20 NOTE — Telephone Encounter (Signed)
Patient is scheduled to see dr berry

## 2017-11-09 ENCOUNTER — Ambulatory Visit: Payer: Medicare PPO | Admitting: Cardiovascular Disease

## 2017-11-09 ENCOUNTER — Encounter: Payer: Self-pay | Admitting: Cardiovascular Disease

## 2017-11-09 VITALS — BP 140/74 | HR 43 | Ht 61.0 in | Wt 137.0 lb

## 2017-11-09 DIAGNOSIS — I441 Atrioventricular block, second degree: Secondary | ICD-10-CM | POA: Diagnosis not present

## 2017-11-09 DIAGNOSIS — E785 Hyperlipidemia, unspecified: Secondary | ICD-10-CM

## 2017-11-09 NOTE — Assessment & Plan Note (Signed)
History of hyperlipidemia on statin therapy. We will recheck a lipid and liver profile 

## 2017-11-09 NOTE — Assessment & Plan Note (Signed)
History of CAD status post myocardial infarction January 2013.  He had 3 stents to his LAD and one stent was RCA.  He remains on dual antiplatelet therapy including aspirin and Plavix.  He does work out at [employer] 5 days a week and is asymptomatic other than he does not have as much energy as he used to.

## 2017-11-09 NOTE — Assessment & Plan Note (Signed)
EKG today shows Mobitz second-degree AV block response of 43.  He is on metoprolol.  He says that he is been somewhat weaker recently and his workouts at the gym.  I am referring him to Dr. Ladona Ridgelaylor for consideration of permanent transvenous pacemaker insertion.

## 2017-11-09 NOTE — Patient Instructions (Signed)
Medication Instructions: Your physician recommends that you continue on your current medications as directed. Please refer to the Current Medication list given to you today.  Labwork: Your physician recommends that you return for a FASTING lipid profile and hepatic function panel at your earliest convenience.   Testing/Procedures: Your physician has requested that you have an echocardiogram. Echocardiography is a painless test that uses sound waves to create images of your heart. It provides your doctor with information about the size and shape of your heart and how well your heart's chambers and valves are working. This procedure takes approximately one hour. There are no restrictions for this procedure.  Follow-Up: You have been referred to Dr. Ladona Ridgelaylor to evaluate for Pacemaker insertion.  Your physician wants you to follow-up in: 6 months with Dr. Allyson SabalBerry. You will receive a reminder letter in the mail two months in advance. If you don't receive a letter, please call our office to schedule the follow-up appointment.  If you need a refill on your cardiac medications before your next appointment, please call your pharmacy.

## 2017-11-09 NOTE — Progress Notes (Signed)
11/09/2017 [facility]   [birth year]  [id number]  Primary Physician Lorenda IshiharaVaradarajan, Rupashree, MD Primary Cardiologist: Runell GessJonathan J Shamus Desantis MD Milagros LollFACP, FACC, [place], [place]  HPI:  Leon Morris is a 84 y.o. thin appearing widowed Caucasian male father of 2 living children (82 year old daughter died March 22, 2017), grandfather at one grandchild who was referred by his PCP for cardiovascular evaluation.  He does have a history of ischemic heart disease status post myocardial infarction January 2013.  He had 3 stent to his LAD in 1 to his RCA.  He is fairly active and works at [employer] 5 days a week.  His risk factors include treated hypertension and hyperlipidemia.  He denies chest pain or shortness of breath although he says that he is been more fatigued while working out at the gym recently.  His last 2D echo performed 11/14/2015 revealed normal LV systolic function.   Current Meds  Medication Sig  . acetaminophen (TYLENOL) 500 MG tablet Take 2 tablets (1,000 mg total) by mouth every 8 (eight) hours.  Marland Kitchen. amLODipine (NORVASC) 5 MG tablet Take 1 tablet (5 mg total) by mouth daily.  . citalopram (CELEXA) 20 MG tablet Take 20 mg by mouth daily.   . clopidogrel (PLAVIX) 75 MG tablet TAKE 1/2 TABLET BY MOUTH EVERY DAY WITH BREAKFAST  . metoprolol tartrate (LOPRESSOR) 25 MG tablet TAKE 1 TABLET BY MOUTH TWICE A DAY  . nitroGLYCERIN (NITROSTAT) 0.4 MG SL tablet Place 0.4 mg under the tongue every 5 (five) minutes as needed for chest pain (UP TO 3 DOSES BEFORE CALLING 911).  . ondansetron (ZOFRAN) 4 MG tablet Take 1 tablet (4 mg total) by mouth every 6 (six) hours as needed for nausea.  . polyethylene glycol (MIRALAX / GLYCOLAX) packet Take 17 g by mouth daily.  . rosuvastatin (CRESTOR) 5 MG tablet Take 1 tablet (5 mg total) by mouth daily.  Marland Kitchen. senna-docusate (SENOKOT-S) 8.6-50 MG tablet Take 1 tablet by mouth at bedtime as needed for mild constipation.  . traMADol (ULTRAM) 50 MG tablet Take 1 tablet  (50 mg total) by mouth every 6 (six) hours as needed for moderate pain.     No Known Allergies  Social History   Socioeconomic History  . Marital status: Widowed    Spouse name: Not on file  . Number of children: Not on file  . Years of education: Not on file  . Highest education level: Not on file  Occupational History  . Not on file  Social Needs  . Financial resource strain: Not on file  . Food insecurity:    Worry: Not on file    Inability: Not on file  . Transportation needs:    Medical: Not on file    Non-medical: Not on file  Tobacco Use  . Smoking status: Former Smoker    Last attempt to quit: 05/18/1941    Years since quitting: 76.5  . Smokeless tobacco: Never Used  Substance and Sexual Activity  . Alcohol use: No  . Drug use: No  . Sexual activity: Never  Lifestyle  . Physical activity:    Days per week: Not on file    Minutes per session: Not on file  . Stress: Not on file  Relationships  . Social connections:    Talks on phone: Not on file    Gets together: Not on file    Attends religious service: Not on file    Active member of club or organization: Not on  file    Attends meetings of clubs or organizations: Not on file    Relationship status: Not on file  . Intimate partner violence:    Fear of current or ex partner: Not on file    Emotionally abused: Not on file    Physically abused: Not on file    Forced sexual activity: Not on file  Other Topics Concern  . Not on file  Social History Narrative  . Not on file     Review of Systems: General: negative for chills, fever, night sweats or weight changes.  Cardiovascular: negative for chest pain, dyspnea on exertion, edema, orthopnea, palpitations, paroxysmal nocturnal dyspnea or shortness of breath Dermatological: negative for rash Respiratory: negative for cough or wheezing Urologic: negative for hematuria Abdominal: negative for nausea, vomiting, diarrhea, bright red blood per rectum, melena,  or hematemesis Neurologic: negative for visual changes, syncope, or dizziness All other systems reviewed and are otherwise negative except as noted above.    Blood pressure 140/74, pulse (!) 43, height 5\' 1"  (1.549 m), weight 137 lb (62.1 kg).  General appearance: alert and no distress Neck: no adenopathy, no carotid bruit, no JVD, supple, symmetrical, trachea midline and thyroid not enlarged, symmetric, no tenderness/mass/nodules Lungs: clear to auscultation bilaterally Heart: regular rate and rhythm, S1, S2 normal, no murmur, click, rub or gallop Extremities: extremities normal, atraumatic, no cyanosis or edema Pulses: 2+ and symmetric Skin: Skin color, texture, turgor normal. No rashes or lesions Neurologic: Alert and oriented X 3, normal strength and tone. Normal symmetric reflexes. Normal coordination and gait  EKG sinus rhythm with type II second-degree AV block and a ventricular response of 43.  I personally reviewed this EKG.  ASSESSMENT AND PLAN:   Coronary artery disease History of CAD status post myocardial infarction January 2013.  He had 3 stents to his LAD and one stent was RCA.  He remains on dual antiplatelet therapy including aspirin and Plavix.  He does work out at [employer] 5 days a week and is asymptomatic other than he does not have as much energy as he used to.  Hypertension History of essential hypertension her blood pressure measured today at 140/74.  He is on amlodipine, metoprolol.  Continue current meds at current dosing.  Hyperlipidemia History of hyperlipidemia on statin therapy.  We will recheck a lipid and liver profile.  Mobitz type 2 second degree heart block EKG today shows Mobitz second-degree AV block response of 43.  He is on metoprolol.  He says that he is been somewhat weaker recently and his workouts at the gym.  I am referring him to Dr. Ladona Ridgel for consideration of permanent transvenous pacemaker insertion.      Runell Gess MD  FACP,FACC,FAHA, [facility] 11/09/2017 11:07 AM

## 2017-11-09 NOTE — Assessment & Plan Note (Signed)
History of essential hypertension her blood pressure measured today at 140/74.  He is on amlodipine, metoprolol.  Continue current meds at current dosing.

## 2017-11-17 ENCOUNTER — Ambulatory Visit (HOSPITAL_COMMUNITY): Payer: Medicare PPO | Attending: Cardiovascular Disease

## 2017-11-17 ENCOUNTER — Other Ambulatory Visit: Payer: Self-pay

## 2017-11-17 ENCOUNTER — Other Ambulatory Visit: Payer: Medicare PPO | Admitting: *Deleted

## 2017-11-17 DIAGNOSIS — I441 Atrioventricular block, second degree: Secondary | ICD-10-CM | POA: Diagnosis not present

## 2017-11-17 DIAGNOSIS — I252 Old myocardial infarction: Secondary | ICD-10-CM | POA: Diagnosis not present

## 2017-11-17 DIAGNOSIS — I2510 Atherosclerotic heart disease of native coronary artery without angina pectoris: Secondary | ICD-10-CM | POA: Diagnosis not present

## 2017-11-17 DIAGNOSIS — E785 Hyperlipidemia, unspecified: Secondary | ICD-10-CM | POA: Diagnosis not present

## 2017-11-17 DIAGNOSIS — I10 Essential (primary) hypertension: Secondary | ICD-10-CM | POA: Insufficient documentation

## 2017-11-17 DIAGNOSIS — I081 Rheumatic disorders of both mitral and tricuspid valves: Secondary | ICD-10-CM | POA: Insufficient documentation

## 2017-11-17 LAB — HEPATIC FUNCTION PANEL
ALBUMIN: 3.8 g/dL (ref 3.2–4.6)
ALT: 11 IU/L (ref 0–44)
AST: 18 IU/L (ref 0–40)
Alkaline Phosphatase: 116 IU/L (ref 39–117)
BILIRUBIN TOTAL: 0.5 mg/dL (ref 0.0–1.2)
Bilirubin, Direct: 0.17 mg/dL (ref 0.00–0.40)
Total Protein: 6.3 g/dL (ref 6.0–8.5)

## 2017-11-17 LAB — LIPID PANEL
Chol/HDL Ratio: 3.3 ratio (ref 0.0–5.0)
Cholesterol, Total: 114 mg/dL (ref 100–199)
HDL: 35 mg/dL — AB (ref >39)
LDL Calculated: 65 mg/dL (ref 0–99)
Triglycerides: 70 mg/dL (ref 0–149)
VLDL CHOLESTEROL CAL: 14 mg/dL (ref 5–40)

## 2017-11-22 ENCOUNTER — Encounter: Payer: Self-pay | Admitting: *Deleted

## 2017-11-25 ENCOUNTER — Encounter: Payer: Self-pay | Admitting: Internal Medicine

## 2017-11-25 ENCOUNTER — Ambulatory Visit: Payer: Medicare PPO | Admitting: Internal Medicine

## 2017-11-25 VITALS — BP 110/58 | HR 57 | Ht 64.0 in | Wt 137.0 lb

## 2017-11-25 DIAGNOSIS — I441 Atrioventricular block, second degree: Secondary | ICD-10-CM | POA: Diagnosis not present

## 2017-11-25 NOTE — Progress Notes (Signed)
HPI Mr. Leon Morris is referred today by Dr. Allyson Sabal for bradycardia due to both heart block and sinus node dysfunction. He is a pleasant and active 84yo man with CAD, HTN and sinus node dysfunction. When he gets up quickly he will feel a little dizzy. He has not had syncope. He had not had much in the way of trouble with exercise. He was noted on ECG to have intermittent high grade AV block. He has peripheral edema.  No Known Allergies   Current Outpatient Medications  Medication Sig Dispense Refill  . acetaminophen (TYLENOL) 500 MG tablet Take 2 tablets (1,000 mg total) by mouth every 8 (eight) hours. 30 tablet 0  . amLODipine (NORVASC) 5 MG tablet Take 1 tablet (5 mg total) by mouth daily.    . citalopram (CELEXA) 20 MG tablet Take 20 mg by mouth daily.     . clopidogrel (PLAVIX) 75 MG tablet TAKE 1/2 TABLET BY MOUTH EVERY DAY WITH BREAKFAST 30 tablet 8  . metoprolol tartrate (LOPRESSOR) 25 MG tablet TAKE 1 TABLET BY MOUTH TWICE A DAY 60 tablet 1  . nitroGLYCERIN (NITROSTAT) 0.4 MG SL tablet Place 0.4 mg under the tongue every 5 (five) minutes as needed for chest pain (UP TO 3 DOSES BEFORE CALLING 911).    . ondansetron (ZOFRAN) 4 MG tablet Take 1 tablet (4 mg total) by mouth every 6 (six) hours as needed for nausea. 20 tablet 0  . polyethylene glycol (MIRALAX / GLYCOLAX) packet Take 17 g by mouth daily. 14 each 0  . rosuvastatin (CRESTOR) 5 MG tablet Take 1 tablet (5 mg total) by mouth daily. 90 tablet 2  . senna-docusate (SENOKOT-S) 8.6-50 MG tablet Take 1 tablet by mouth at bedtime as needed for mild constipation.    . traMADol (ULTRAM) 50 MG tablet Take 1 tablet (50 mg total) by mouth every 6 (six) hours as needed for moderate pain. 30 tablet 0  . Vitamin D, Ergocalciferol, (DRISDOL) 50000 units CAPS capsule Take 50,000 Units by mouth every 7 (seven) days.     No current facility-administered medications for this visit.    Facility-Administered Medications Ordered in Other Visits    Medication Dose Route Frequency Provider Last Rate Last Dose  . lactated ringers infusion    Continuous PRN Luster Landsberg, CRNA         Past Medical History:  Diagnosis Date  . Anxiety   . Arthritis    LOWER BACK PAIN   . CAD (coronary artery disease)   . Hypercholesterolemia   . Hypertension 06/16/2011  . NSTEMI (non-ST elevated myocardial infarction) (HCC)    06/16/2011   DR [place]     ROS:   All systems reviewed and negative except as noted in the HPI.   Past Surgical History:  Procedure Laterality Date  . CARDIAC CATHETERIZATION    . CATARACT EXTRACTION W/ INTRAOCULAR LENS  IMPLANT, BILATERAL    . CORONARY ANGIOPLASTY     06/2011   DR [place]   . HERNIA REPAIR    . JOINT REPLACEMENT    . KNEE SURGERY    . LEFT HEART CATHETERIZATION WITH CORONARY ANGIOGRAM N/A 06/13/2011   Procedure: LEFT HEART CATHETERIZATION WITH CORONARY ANGIOGRAM;  Surgeon: Peter M [place], MD;  Location: [facility] CATH LAB;  Service: Cardiovascular;  Laterality: N/A;  . SHOULDER SURGERY       Family History  Problem Relation Age of Onset  . Lung cancer Father   . CAD Father   .  CAD Brother   . Heart attack Neg Hx   . Hypertension Neg Hx   . Stroke Neg Hx      Social History   Socioeconomic History  . Marital status: Widowed    Spouse name: Not on file  . Number of children: Not on file  . Years of education: Not on file  . Highest education level: Not on file  Occupational History  . Not on file  Social Needs  . Financial resource strain: Not on file  . Food insecurity:    Worry: Not on file    Inability: Not on file  . Transportation needs:    Medical: Not on file    Non-medical: Not on file  Tobacco Use  . Smoking status: Former Smoker    Last attempt to quit: 05/18/1941    Years since quitting: 76.5  . Smokeless tobacco: Never Used  Substance and Sexual Activity  . Alcohol use: No  . Drug use: No  . Sexual activity: Never  Lifestyle  . Physical activity:    Days per week: Not  on file    Minutes per session: Not on file  . Stress: Not on file  Relationships  . Social connections:    Talks on phone: Not on file    Gets together: Not on file    Attends religious service: Not on file    Active member of club or organization: Not on file    Attends meetings of clubs or organizations: Not on file    Relationship status: Not on file  . Intimate partner violence:    Fear of current or ex partner: Not on file    Emotionally abused: Not on file    Physically abused: Not on file    Forced sexual activity: Not on file  Other Topics Concern  . Not on file  Social History Narrative  . Not on file     BP (!) 110/58   Pulse (!) 57   Ht 5\' 4"  (1.626 m)   Wt 137 lb (62.1 kg)   SpO2 96%   BMI 23.52 kg/m   Physical Exam:  Well appearing NAD HEENT: Unremarkable Neck:  No JVD, no thyromegally Lymphatics:  No adenopathy Back:  No CVA tenderness Lungs:  Clear HEART:  Regular rate rhythm, no murmurs, no rubs, no clicks Abd:  soft, positive bowel sounds, no organomegally, no rebound, no guarding Ext:  2 plus pulses, no edema, no cyanosis, no clubbing Skin:  No rashes no nodules Neuro:  CN II through XII intact, motor grossly intact  EKG - NSR with first degree AV block   Assess/Plan: 1. AV block - he is asymptomatic at this point. I have recommended watchful waiting and stopping his beta blocker. 2. HTN - his blood pressure is a lttle low. We will follow. 3. CAD - he denies anginal symptoms. He is encouraged to remain active. Hopefully his angina will not worsen with stopping his beta blocker.  Leonia ReevesGregg Nayeli Calvert,M.D.

## 2017-11-25 NOTE — Patient Instructions (Addendum)
Medication Instructions:  Your physician has recommended you make the following change in your medication:  1.  Stop taking metoprolol TODAY. 2.  On December 03, 2017-stop taking your amlodipine.  If your blood pressure systolic number (the top number) is consistently over 150 please call me.  Boneta LucksJenny RN for Dr. Laqueta Jeanaylor  Labwork: None ordered.  Testing/Procedures: None ordered.  Follow-Up: Your physician wants you to follow-up in: 3 months with Dr. Ladona Ridgelaylor.      Any Other Special Instructions Will Be Listed Below (If Applicable).  Follow a low salt diet.  If you need a refill on your cardiac medications before your next appointment, please call your pharmacy.

## 2017-11-27 ENCOUNTER — Encounter: Payer: Self-pay | Admitting: Internal Medicine

## 2018-01-10 DIAGNOSIS — H353132 Nonexudative age-related macular degeneration, bilateral, intermediate dry stage: Secondary | ICD-10-CM | POA: Diagnosis not present

## 2018-01-10 DIAGNOSIS — H353212 Exudative age-related macular degeneration, right eye, with inactive choroidal neovascularization: Secondary | ICD-10-CM | POA: Diagnosis not present

## 2018-01-10 DIAGNOSIS — H3561 Retinal hemorrhage, right eye: Secondary | ICD-10-CM | POA: Diagnosis not present

## 2018-02-21 DIAGNOSIS — I10 Essential (primary) hypertension: Secondary | ICD-10-CM | POA: Diagnosis not present

## 2018-02-21 DIAGNOSIS — M810 Age-related osteoporosis without current pathological fracture: Secondary | ICD-10-CM | POA: Diagnosis not present

## 2018-02-21 DIAGNOSIS — E785 Hyperlipidemia, unspecified: Secondary | ICD-10-CM | POA: Diagnosis not present

## 2018-02-21 DIAGNOSIS — N189 Chronic kidney disease, unspecified: Secondary | ICD-10-CM | POA: Diagnosis not present

## 2018-02-21 DIAGNOSIS — I2510 Atherosclerotic heart disease of native coronary artery without angina pectoris: Secondary | ICD-10-CM | POA: Diagnosis not present

## 2018-03-06 ENCOUNTER — Ambulatory Visit: Payer: Medicare PPO | Admitting: Internal Medicine

## 2018-03-29 ENCOUNTER — Ambulatory Visit: Payer: Medicare PPO | Admitting: Internal Medicine

## 2018-04-06 ENCOUNTER — Ambulatory Visit: Payer: Medicare PPO | Admitting: Internal Medicine

## 2018-04-06 ENCOUNTER — Encounter (INDEPENDENT_AMBULATORY_CARE_PROVIDER_SITE_OTHER): Payer: Self-pay

## 2018-04-06 ENCOUNTER — Encounter: Payer: Self-pay | Admitting: Internal Medicine

## 2018-04-06 VITALS — BP 124/80 | HR 63 | Ht 64.0 in | Wt 135.6 lb

## 2018-04-06 DIAGNOSIS — I10 Essential (primary) hypertension: Secondary | ICD-10-CM

## 2018-04-06 DIAGNOSIS — I2510 Atherosclerotic heart disease of native coronary artery without angina pectoris: Secondary | ICD-10-CM

## 2018-04-06 DIAGNOSIS — I441 Atrioventricular block, second degree: Secondary | ICD-10-CM | POA: Diagnosis not present

## 2018-04-06 NOTE — Progress Notes (Signed)
HPI Leon Morris returns today for ongoing followup of sinus bradycardia due to both heart block and sinus node dysfunction. He is a pleasant and active 84 yo man with CAD, HTN and sinus node dysfunction. When he gets up quickly he will feel a little dizzy. He has not had syncope. He had not had much in the way of trouble with exercise. He was noted on ECG to have intermittent high grade AV block. He has peripheral edema. Since I saw him last 6 months ago, he has not fallen and is asymptomatic despite exercising 5 days a week.   No Known Allergies   Current Outpatient Medications  Medication Sig Dispense Refill  . acetaminophen (TYLENOL) 500 MG tablet Take 2 tablets (1,000 mg total) by mouth every 8 (eight) hours. 30 tablet 0  . citalopram (CELEXA) 20 MG tablet Take 20 mg by mouth daily.     . clopidogrel (PLAVIX) 75 MG tablet TAKE 1/2 TABLET BY MOUTH EVERY DAY WITH BREAKFAST 30 tablet 8  . nitroGLYCERIN (NITROSTAT) 0.4 MG SL tablet Place 0.4 mg under the tongue every 5 (five) minutes as needed for chest pain (UP TO 3 DOSES BEFORE CALLING 911).    . ondansetron (ZOFRAN) 4 MG tablet Take 1 tablet (4 mg total) by mouth every 6 (six) hours as needed for nausea. 20 tablet 0  . polyethylene glycol (MIRALAX / GLYCOLAX) packet Take 17 g by mouth daily. 14 each 0  . rosuvastatin (CRESTOR) 5 MG tablet Take 1 tablet (5 mg total) by mouth daily. 90 tablet 2  . senna-docusate (SENOKOT-S) 8.6-50 MG tablet Take 1 tablet by mouth at bedtime as needed for mild constipation.    . traMADol (ULTRAM) 50 MG tablet Take 1 tablet (50 mg total) by mouth every 6 (six) hours as needed for moderate pain. 30 tablet 0  . Vitamin D, Ergocalciferol, (DRISDOL) 50000 units CAPS capsule Take 50,000 Units by mouth every 7 (seven) days.     No current facility-administered medications for this visit.    Facility-Administered Medications Ordered in Other Visits  Medication Dose Route Frequency Provider Last Rate Last  Dose  . lactated ringers infusion    Continuous PRN Luster Landsberg, CRNA         Past Medical History:  Diagnosis Date  . Anxiety   . Arthritis    LOWER BACK PAIN   . CAD (coronary artery disease)   . Hypercholesterolemia   . Hypertension 06/16/2011  . NSTEMI (non-ST elevated myocardial infarction) (HCC)    06/16/2011   DR [place]     ROS:   All systems reviewed and negative except as noted in the HPI.   Past Surgical History:  Procedure Laterality Date  . CARDIAC CATHETERIZATION    . CATARACT EXTRACTION W/ INTRAOCULAR LENS  IMPLANT, BILATERAL    . CORONARY ANGIOPLASTY     06/2011   DR [place]   . HERNIA REPAIR    . JOINT REPLACEMENT    . KNEE SURGERY    . LEFT HEART CATHETERIZATION WITH CORONARY ANGIOGRAM N/A 06/13/2011   Procedure: LEFT HEART CATHETERIZATION WITH CORONARY ANGIOGRAM;  Surgeon: Peter M [place], MD;  Location: [facility] CATH LAB;  Service: Cardiovascular;  Laterality: N/A;  . SHOULDER SURGERY       Family History  Problem Relation Age of Onset  . Lung cancer Father   . CAD Father   . CAD Brother   . Heart attack Neg Hx   . Hypertension Neg  Hx   . Stroke Neg Hx      Social History   Socioeconomic History  . Marital status: Widowed    Spouse name: Not on file  . Number of children: Not on file  . Years of education: Not on file  . Highest education level: Not on file  Occupational History  . Not on file  Social Needs  . Financial resource strain: Not on file  . Food insecurity:    Worry: Not on file    Inability: Not on file  . Transportation needs:    Medical: Not on file    Non-medical: Not on file  Tobacco Use  . Smoking status: Former Smoker    Last attempt to quit: 05/18/1941    Years since quitting: 76.9  . Smokeless tobacco: Never Used  Substance and Sexual Activity  . Alcohol use: No  . Drug use: No  . Sexual activity: Never  Lifestyle  . Physical activity:    Days per week: Not on file    Minutes per session: Not on file  . Stress:  Not on file  Relationships  . Social connections:    Talks on phone: Not on file    Gets together: Not on file    Attends religious service: Not on file    Active member of club or organization: Not on file    Attends meetings of clubs or organizations: Not on file    Relationship status: Not on file  . Intimate partner violence:    Fear of current or ex partner: Not on file    Emotionally abused: Not on file    Physically abused: Not on file    Forced sexual activity: Not on file  Other Topics Concern  . Not on file  Social History Narrative  . Not on file     BP 124/80   Pulse 63   Ht 5\' 4"  (1.626 m)   Wt 135 lb 9.6 oz (61.5 kg)   SpO2 96%   BMI 23.28 kg/m   Physical Exam:  Well appearing NAD HEENT: Unremarkable Neck:  No JVD, no thyromegally Lymphatics:  No adenopathy Back:  No CVA tenderness Lungs:  Clear with no wheezes HEART:  Regular rate rhythm, no murmurs, no rubs, no clicks Abd:  soft, positive bowel sounds, no organomegally, no rebound, no guarding Ext:  2 plus pulses, no edema, no cyanosis, no clubbing Skin:  No rashes no nodules Neuro:  CN II through XII intact, motor grossly intact  EKG - nsr with first degree AV block   Assess/Plan: 1. Sinus node dysfunction/AV block - his ECG has improved since I saw him last and he is asymptomatic. I have recommended watchful waiting. I reviewed the warning symptoms suggesting his heart is going too slow.  2. HTN - his blood pressure is well controlled. No change in his meds. 3. Disp. - I will see him back as needed.  Leonia Reeves.D.

## 2018-04-06 NOTE — Patient Instructions (Addendum)
Medication Instructions:  Your physician recommends that you continue on your current medications as directed. Please refer to the Current Medication list given to you today.  If you need a refill on your cardiac medications before your next appointment, please call your pharmacy.   Lab work: None ordered.  If you have labs (blood work) drawn today and your tests are completely normal, you will receive your results only by: . MyChart Message (if you have MyChart) OR . A paper copy in the mail If you have any lab test that is abnormal or we need to change your treatment, we will call you to review the results.  Testing/Procedures: None ordered.  Follow-Up:  Your physician wants you to follow-up in: as needed with Dr. Taylor.       At [facility], you and your health needs are our priority.  As part of our continuing mission to provide you with exceptional heart care, we have created designated Provider Care Teams.  These Care Teams include your primary Cardiologist (physician) and Advanced Practice Providers (APPs -  Physician Assistants and Nurse Practitioners) who all work together to provide you with the care you need, when you need it.  Any Other Special Instructions Will Be Listed Below (If Applicable).     

## 2018-04-08 IMAGING — CT CT PELVIS W/O CM
3 of 5 series · 16 of 46 positions shown, 18 images · non-contrast
Comparison: 11/13/2015

CLINICAL DATA: Fall while working in yard. Right hip pain, unable
to bear weight. Comminuted right acetabular fracture.

EXAM:
CT PELVIS WITHOUT CONTRAST
TECHNIQUE: Multidetector CT imaging of the pelvis was performed following the
standard protocol without intravenous contrast.

[Series 2: bone windows · axial · 0.74mm/px · z∈[-868,-814]mm · 3 of 87 slices shown]
[im 10/87  bone]
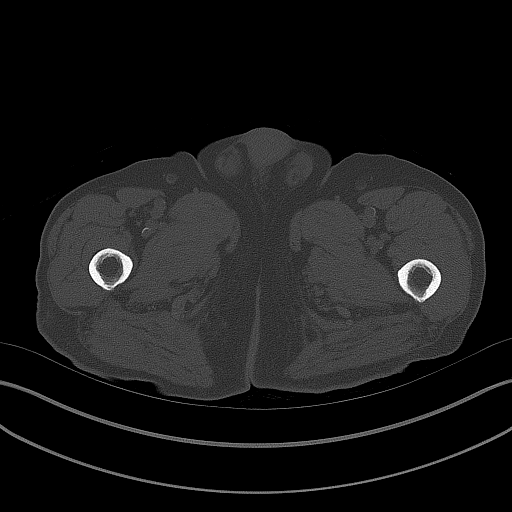
[im 19/87  bone]
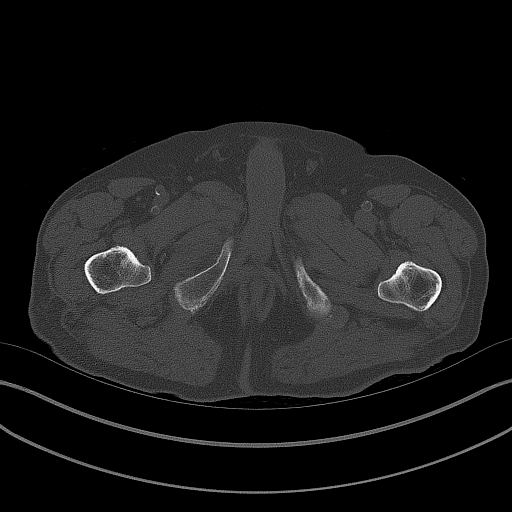
[im 28/87  bone]
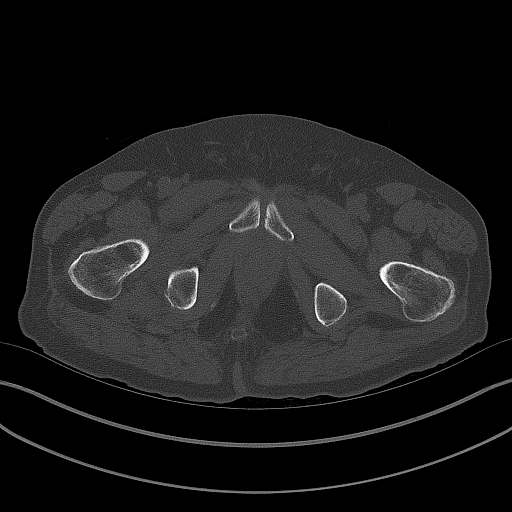

[Series 3: pelvis st · axial · 0.74mm/px · z∈[-872,-662]mm · 10 of 52 slices shown, 12 images]
[im 5/52  soft-tissue]
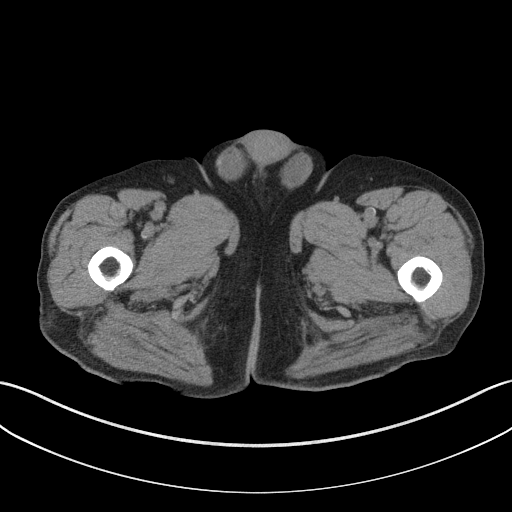
[im 5/52  bone]
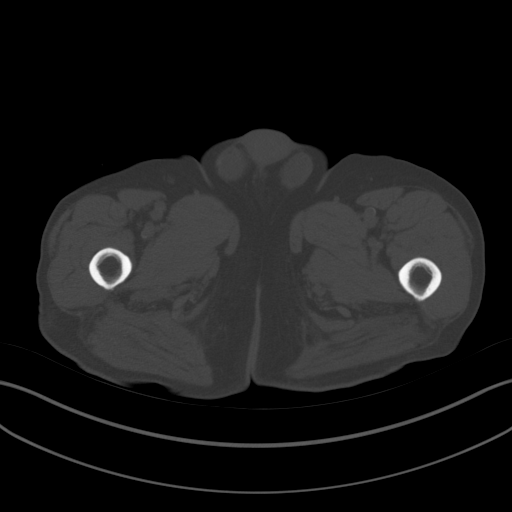
[im 10/52  soft-tissue]
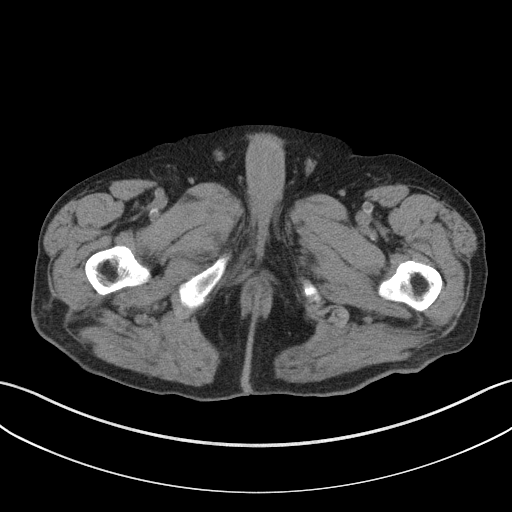
[im 14/52  soft-tissue]
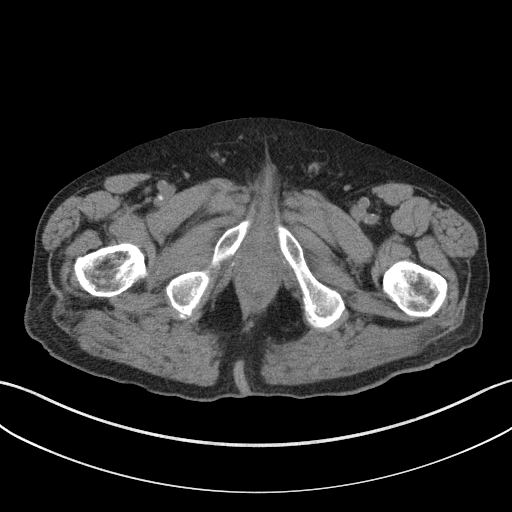
[im 19/52  soft-tissue]
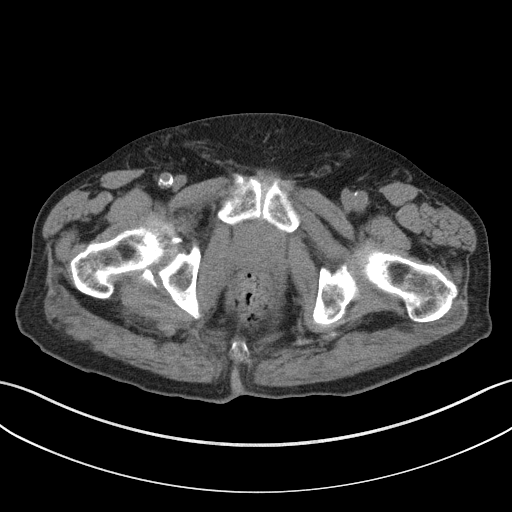
[im 24/52  soft-tissue]
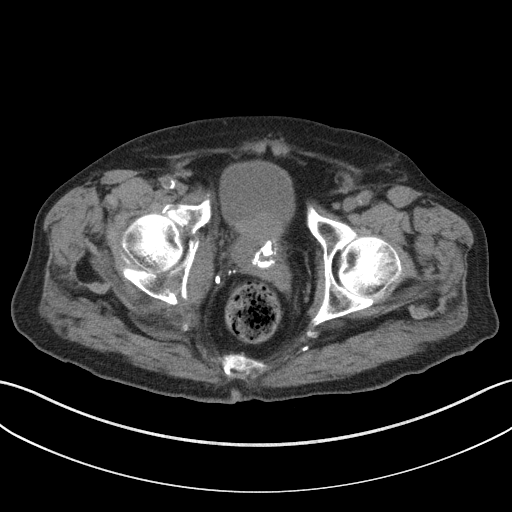
[im 28/52  soft-tissue]
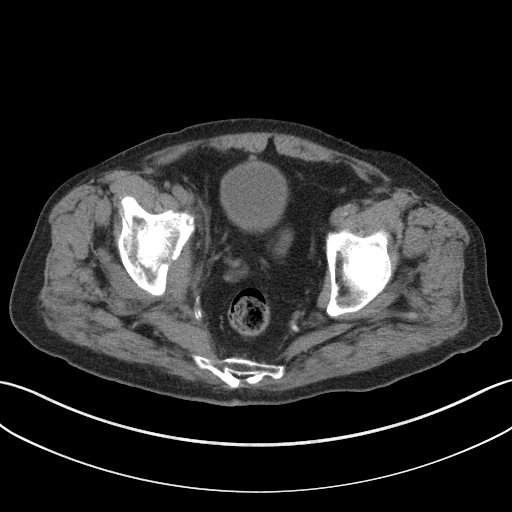
[im 33/52  soft-tissue]
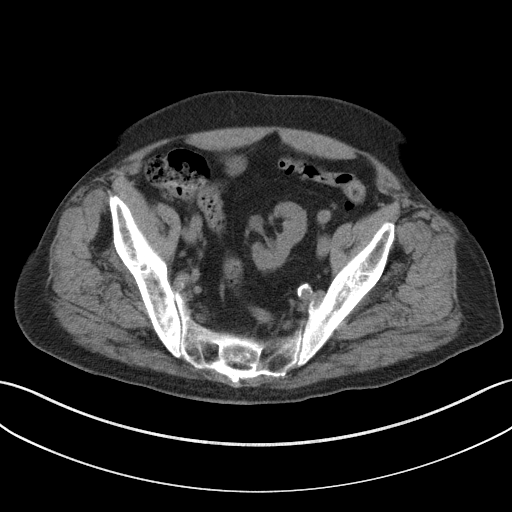
[im 38/52  soft-tissue]
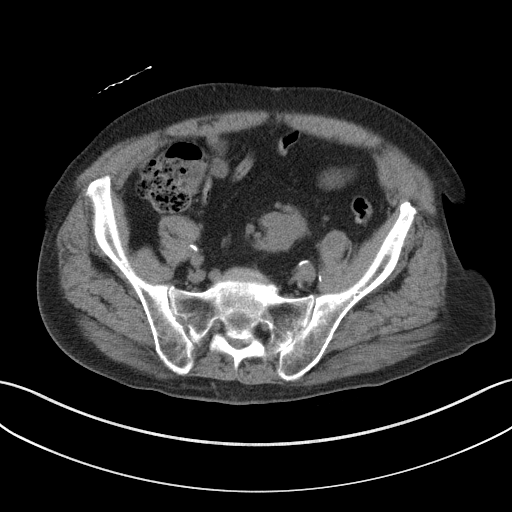
[im 42/52  soft-tissue]
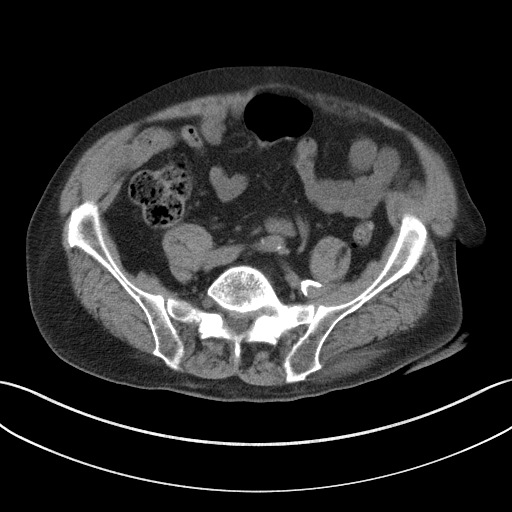
[im 42/52  bone]
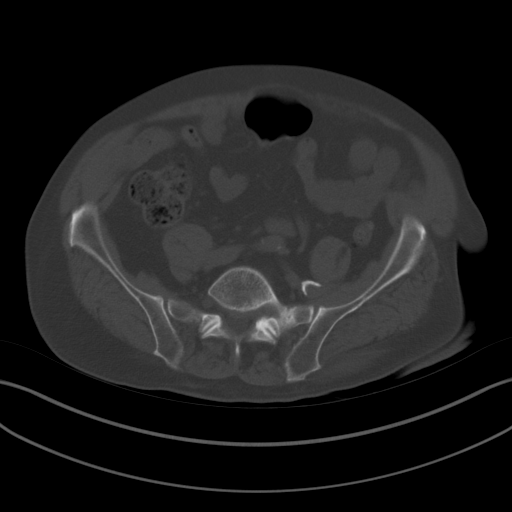
[im 47/52  soft-tissue]
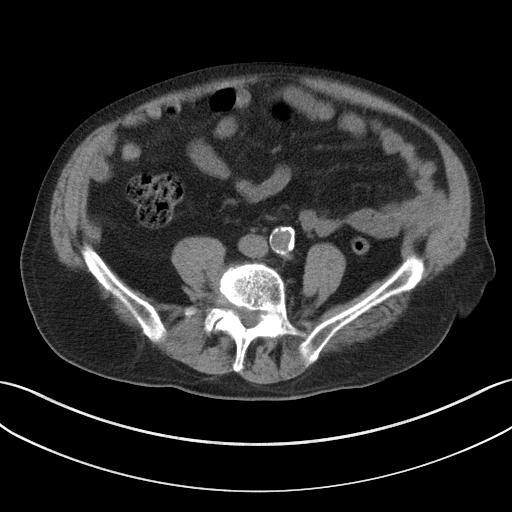

[Series 4: coronal images · coronal · 0.50mm/px · 3 of 131 slices shown]
[im 33/131  soft-tissue]
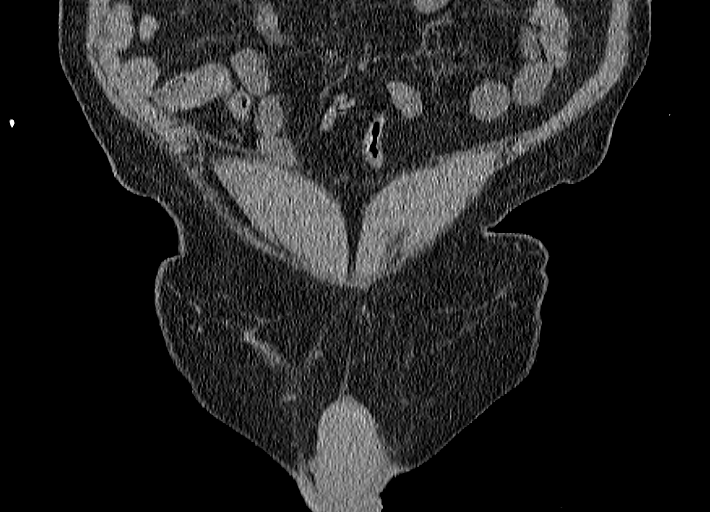
[im 66/131  soft-tissue]
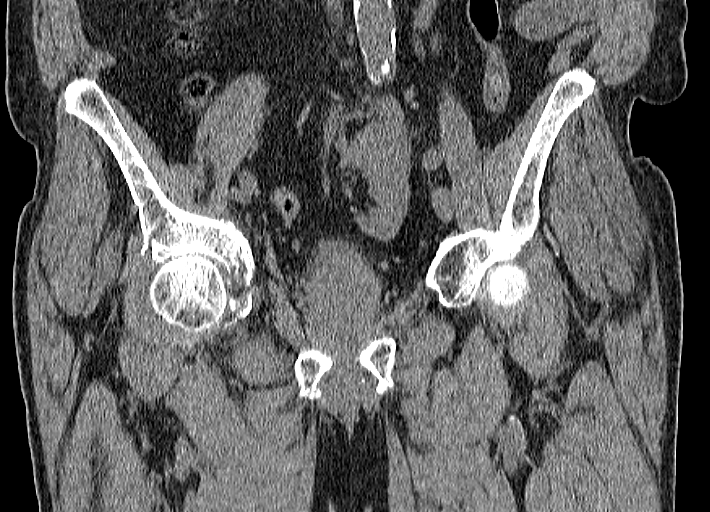
[im 98/131  soft-tissue]
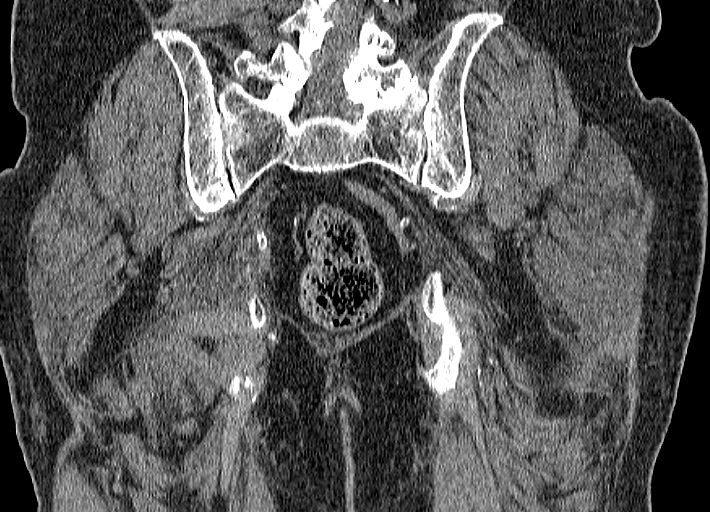

[16 of 46 positions shown; findings below may reference images not displayed]

FINDINGS: There is a fracture of both columns of the right acetabulum, with
several fracture planes extending through the anterior wall, a
fracture plane extending through the posterior wall, a fracture
plane extending up in the iliac bone, and a fracture of the inferior
pubic ramus. The ischium is essentially separated away, and
continuous with the quadrilateral plate segment which is medially
displaced by 1.3 cm. As expected, there is adjacent hematoma in the
operator internus muscle. I do not see a fracture of the left pelvic
bones. No proximal femur fracture seen.

Lower lumbar spondylosis and degenerative disc disease, causing left
foraminal impingement at L3-4 and L4-5.

Aortoiliac atherosclerotic vascular disease. Left pelvic kidney.
Lobular prostate gland with thick calcification along the superior
margin. I do not see findings to suggest a bladder leak. Stranding
in the right sciatic notch around the sciatic nerve but without
obvious impingement.
IMPRESSION: 1. Fracture both columns of the right acetabulum. The quadrilateral
plate segment, in continuity with the ischium, is medially displaced
1.3 cm. Adjacent hematoma in the operator internus muscle. I do not
see an obvious bladder leak.
2. Left pelvic kidney.
3. Large prostate gland with a calcification along the superior
margin.
4.  Aortoiliac atherosclerotic vascular disease.
5. Lower lumbar spondylosis, scoliosis, and degenerative disc
disease, with some foraminal impingement at L3-4 and L4-5.

## 2018-04-08 IMAGING — CR DG CHEST 2V
2 series · 2 of 2 positions shown · non-contrast
Comparison: July 01, 2011

CLINICAL DATA: Pain following fall.  Hypertension.

EXAM:
CHEST  2 VIEW

[w chest lat]
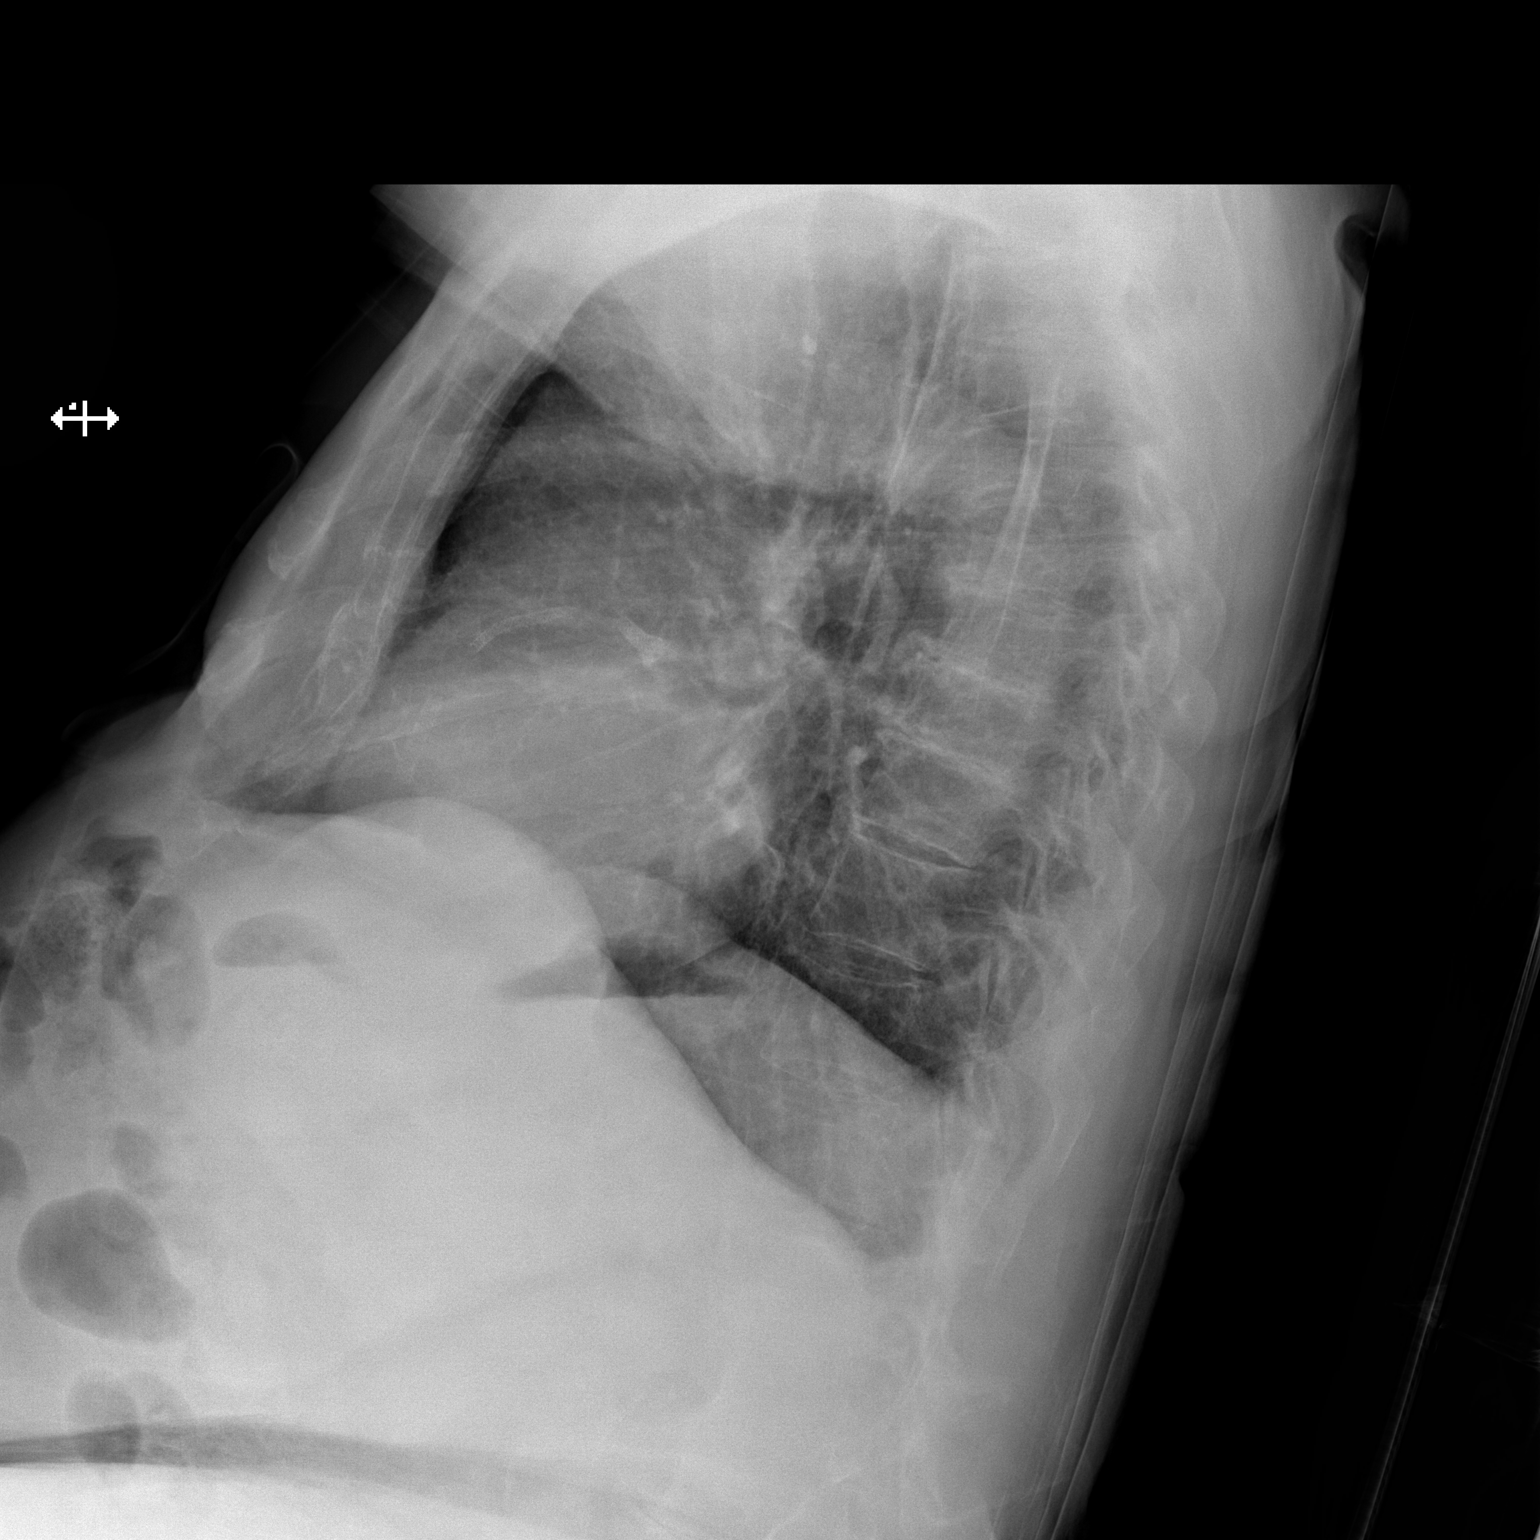

[x chest ap]
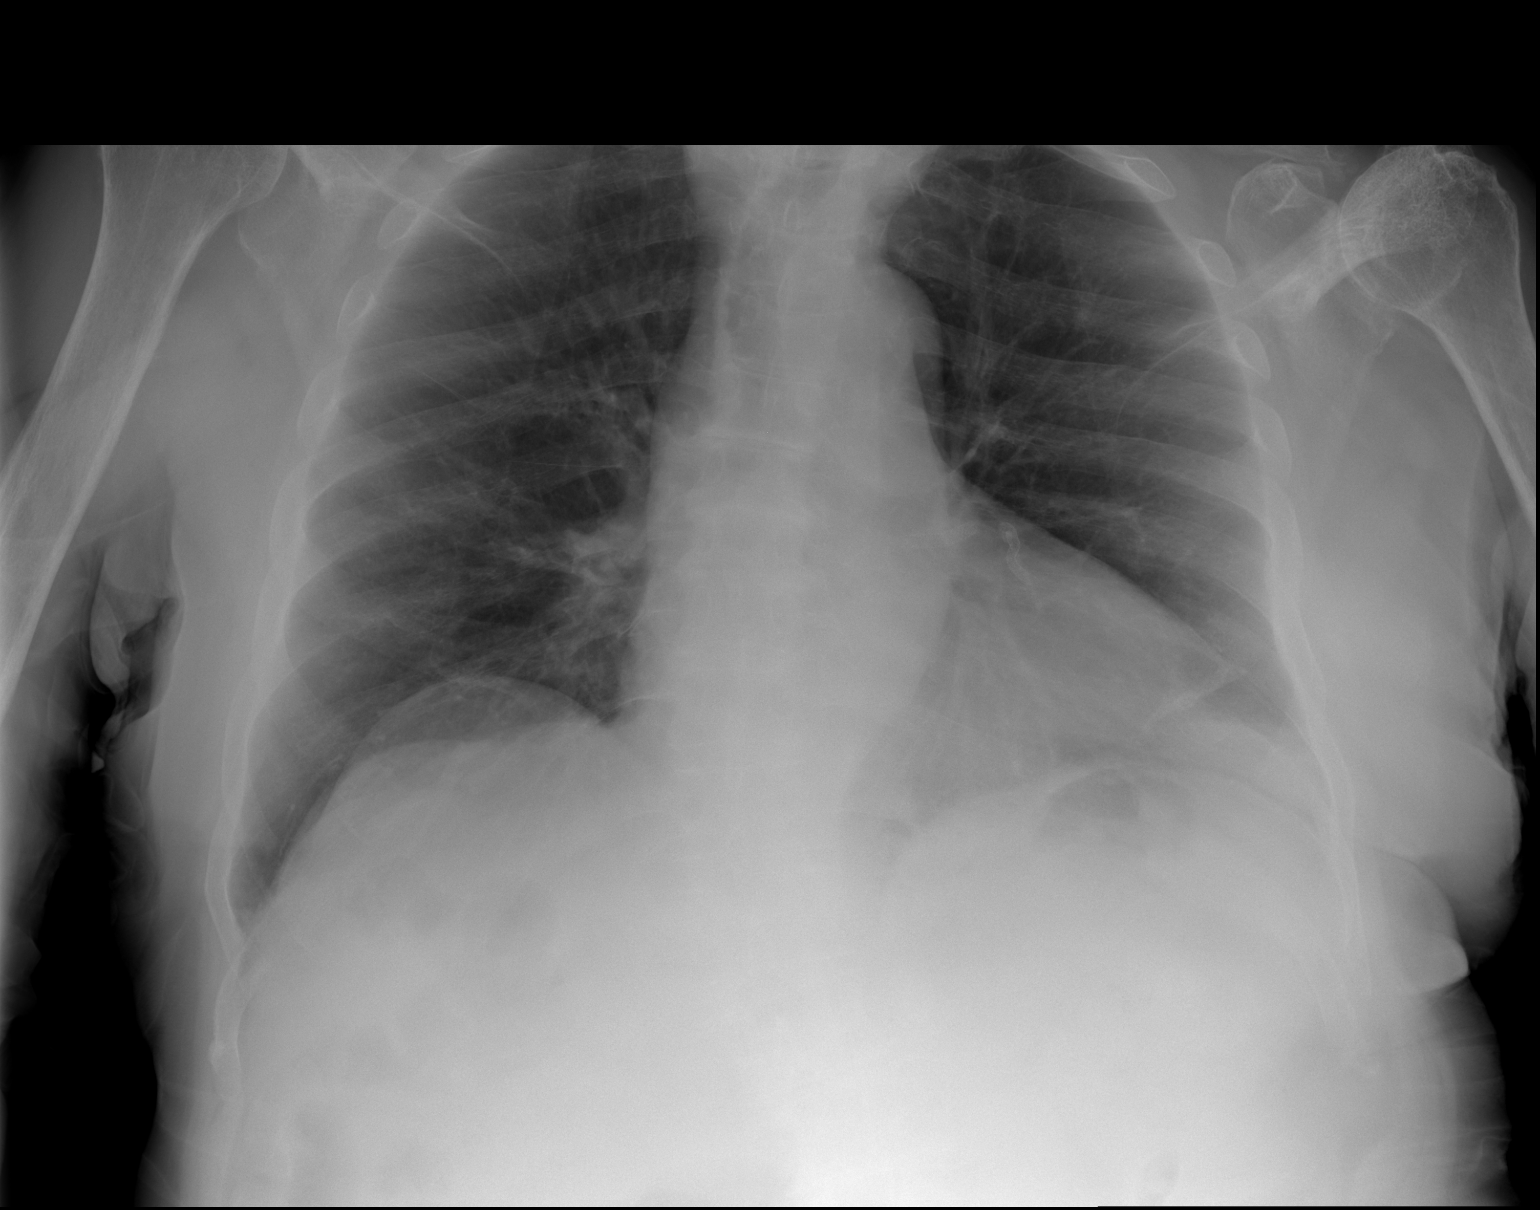

[2 of 2 positions shown; findings below may reference images not displayed]

FINDINGS: There is no edema or consolidation. Heart is upper normal in size
with pulmonary vascularity within normal limits. Coronary stents are
noted in the left anterior descending and right coronary arteries.
No adenopathy. No pneumothorax. There is arthropathy in both
shoulders. There is evidence of old trauma involving the lateral
left clavicle. No acute appearing fracture evident.
IMPRESSION: No edema or consolidation. Cardiac stents present. No pneumothorax.
Arthropathy in both shoulders with probable chronic rotator cuff
tears bilaterally.

## 2018-05-17 DIAGNOSIS — H3561 Retinal hemorrhage, right eye: Secondary | ICD-10-CM | POA: Diagnosis not present

## 2018-05-17 DIAGNOSIS — H353122 Nonexudative age-related macular degeneration, left eye, intermediate dry stage: Secondary | ICD-10-CM | POA: Diagnosis not present

## 2018-05-17 DIAGNOSIS — H353212 Exudative age-related macular degeneration, right eye, with inactive choroidal neovascularization: Secondary | ICD-10-CM | POA: Diagnosis not present

## 2018-05-17 DIAGNOSIS — H353132 Nonexudative age-related macular degeneration, bilateral, intermediate dry stage: Secondary | ICD-10-CM | POA: Diagnosis not present

## 2018-06-07 DIAGNOSIS — E860 Dehydration: Secondary | ICD-10-CM

## 2018-06-07 HISTORY — DX: Dehydration: E86.0

## 2018-06-19 DIAGNOSIS — H353212 Exudative age-related macular degeneration, right eye, with inactive choroidal neovascularization: Secondary | ICD-10-CM | POA: Diagnosis not present

## 2018-06-19 DIAGNOSIS — H353122 Nonexudative age-related macular degeneration, left eye, intermediate dry stage: Secondary | ICD-10-CM | POA: Diagnosis not present

## 2018-06-19 DIAGNOSIS — Z961 Presence of intraocular lens: Secondary | ICD-10-CM | POA: Diagnosis not present

## 2018-06-19 DIAGNOSIS — H04123 Dry eye syndrome of bilateral lacrimal glands: Secondary | ICD-10-CM | POA: Diagnosis not present

## 2018-09-24 ENCOUNTER — Other Ambulatory Visit: Payer: Self-pay

## 2018-09-24 ENCOUNTER — Emergency Department (HOSPITAL_COMMUNITY)
Admission: EM | Admit: 2018-09-24 | Discharge: 2018-09-24 | Disposition: A | Discharge status: Home / Self Care | Payer: Medicare PPO | Attending: Emergency Medicine | Admitting: Emergency Medicine

## 2018-09-24 ENCOUNTER — Emergency Department (HOSPITAL_COMMUNITY): Payer: Medicare PPO

## 2018-09-24 ENCOUNTER — Encounter (HOSPITAL_COMMUNITY): Payer: Self-pay | Admitting: Emergency Medicine

## 2018-09-24 DIAGNOSIS — Y9301 Activity, walking, marching and hiking: Secondary | ICD-10-CM | POA: Diagnosis not present

## 2018-09-24 DIAGNOSIS — W19XXXA Unspecified fall, initial encounter: Secondary | ICD-10-CM | POA: Diagnosis not present

## 2018-09-24 DIAGNOSIS — Z79899 Other long term (current) drug therapy: Secondary | ICD-10-CM | POA: Insufficient documentation

## 2018-09-24 DIAGNOSIS — S60511A Abrasion of right hand, initial encounter: Secondary | ICD-10-CM | POA: Insufficient documentation

## 2018-09-24 DIAGNOSIS — S022XXA Fracture of nasal bones, initial encounter for closed fracture: Secondary | ICD-10-CM | POA: Diagnosis not present

## 2018-09-24 DIAGNOSIS — Z7901 Long term (current) use of anticoagulants: Secondary | ICD-10-CM | POA: Diagnosis not present

## 2018-09-24 DIAGNOSIS — I2510 Atherosclerotic heart disease of native coronary artery without angina pectoris: Secondary | ICD-10-CM | POA: Diagnosis not present

## 2018-09-24 DIAGNOSIS — I252 Old myocardial infarction: Secondary | ICD-10-CM | POA: Insufficient documentation

## 2018-09-24 DIAGNOSIS — S0121XA Laceration without foreign body of nose, initial encounter: Secondary | ICD-10-CM | POA: Insufficient documentation

## 2018-09-24 DIAGNOSIS — S80212A Abrasion, left knee, initial encounter: Secondary | ICD-10-CM | POA: Diagnosis not present

## 2018-09-24 DIAGNOSIS — Y999 Unspecified external cause status: Secondary | ICD-10-CM | POA: Diagnosis not present

## 2018-09-24 DIAGNOSIS — S0081XA Abrasion of other part of head, initial encounter: Secondary | ICD-10-CM | POA: Insufficient documentation

## 2018-09-24 DIAGNOSIS — S199XXA Unspecified injury of neck, initial encounter: Secondary | ICD-10-CM | POA: Diagnosis not present

## 2018-09-24 DIAGNOSIS — W1830XA Fall on same level, unspecified, initial encounter: Secondary | ICD-10-CM | POA: Diagnosis not present

## 2018-09-24 DIAGNOSIS — S022XXB Fracture of nasal bones, initial encounter for open fracture: Secondary | ICD-10-CM | POA: Diagnosis not present

## 2018-09-24 DIAGNOSIS — S60512A Abrasion of left hand, initial encounter: Secondary | ICD-10-CM | POA: Insufficient documentation

## 2018-09-24 DIAGNOSIS — Y92481 Parking lot as the place of occurrence of the external cause: Secondary | ICD-10-CM | POA: Diagnosis not present

## 2018-09-24 DIAGNOSIS — I10 Essential (primary) hypertension: Secondary | ICD-10-CM | POA: Insufficient documentation

## 2018-09-24 DIAGNOSIS — S0083XA Contusion of other part of head, initial encounter: Secondary | ICD-10-CM | POA: Diagnosis not present

## 2018-09-24 DIAGNOSIS — S0091XA Abrasion of unspecified part of head, initial encounter: Secondary | ICD-10-CM | POA: Diagnosis not present

## 2018-09-24 DIAGNOSIS — R5381 Other malaise: Secondary | ICD-10-CM | POA: Diagnosis not present

## 2018-09-24 MED ORDER — LIDOCAINE HCL 2 % IJ SOLN
10.0000 mL | Freq: Once | INTRAMUSCULAR | Status: AC
  Administered 2018-09-24: 17:00:00 200 mg
  Filled 2018-09-24: qty 20

## 2018-09-24 MED ORDER — AMOXICILLIN-POT CLAVULANATE 875-125 MG PO TABS: 1.0000 | ORAL_TABLET | Freq: Two times a day (BID) | ORAL | 0 refills | Status: DC

## 2018-09-24 NOTE — ED Provider Notes (Signed)
[facility]   CSN: [id number] Arrival date & time: 09/24/18  1558    History   Chief Complaint Chief Complaint  Patient presents with   Fall    HPI Leon Morris is a 85 y.o. male.     The history is provided by the patient. No language interpreter was used.  Fall  This is a new problem. The current episode started less than 1 hour ago. The problem occurs constantly. The problem has not changed since onset.Pertinent negatives include no chest pain and no abdominal pain. Nothing aggravates the symptoms. Nothing relieves the symptoms. He has tried nothing for the symptoms. The treatment provided no relief.  Pt reports he slipped and fell in the parking lot at [employer].  Pt is suppose to use a walker but was only using a cane. Pt his his face and nose.  Pt denies loss of consciousness.   Pt is on plavix Past Medical History:  Diagnosis Date   Anxiety    Arthritis    LOWER BACK PAIN    CAD (coronary artery disease)    Hypercholesterolemia    Hypertension 06/16/2011   NSTEMI (non-ST elevated myocardial infarction) (HCC)    06/16/2011   DR [place]AN     Patient Active Problem List   Diagnosis Date Noted   Mobitz type 2 second degree heart block 11/09/2017   Acetabular fracture (HCC) 11/13/2015   Pelvic fracture (HCC) 11/13/2015   Old MI (myocardial infarction) 10/29/2015   Pseudoaneurysm (HCC) 07/02/2011   Coronary artery disease 06/16/2011   Hypertension 06/16/2011   Hyperlipidemia 06/16/2011    Past Surgical History:  Procedure Laterality Date   CARDIAC CATHETERIZATION     CATARACT EXTRACTION W/ INTRAOCULAR LENS  IMPLANT, BILATERAL     CORONARY ANGIOPLASTY     06/2011   DR [place]    HERNIA REPAIR     JOINT REPLACEMENT     KNEE SURGERY     LEFT HEART CATHETERIZATION WITH CORONARY ANGIOGRAM N/A 06/13/2011   Procedure: LEFT HEART CATHETERIZATION WITH CORONARY ANGIOGRAM;  Surgeon: Peter M Sw[place]Jordan, MD;   [facility];  Service: Cardiovascular;  Laterality: N/A;   SHOULDER SURGERY          Home Medications    Prior to Admission medications   Medication Sig Start Date End Date Taking? Authorizing Provider  acetaminophen (TYLENOL) 500 MG tablet Take 2 tablets (1,000 mg total) by mouth every 8 (eight) hours. 11/15/15   Ghimire, Werner LeanShanker M, MD  amoxicillin-clavulanate (AUGMENTIN) 875-125 MG tablet Take 1 tablet by mouth every 12 (twelve) hours. 09/24/18   Elson AreasSofia, Thanya Cegielski K, PA-C  citalopram (CELEXA) 20 MG tablet Take 20 mg by mouth daily.     [provider]  clopidogrel (PLAVIX) 75 MG tablet TAKE 1/2 TABLET BY MOUTH EVERY DAY WITH BREAKFAST 10/06/15   Corky CraftsVaranasi, Jayadeep S, MD  nitroGLYCERIN (NITROSTAT) 0.4 MG SL tablet Place 0.4 mg under the tongue every 5 (five) minutes as needed for chest pain (UP TO 3 DOSES BEFORE CALLING 911).    [provider]  ondansetron (ZOFRAN) 4 MG tablet Take 1 tablet (4 mg total) by mouth every 6 (six) hours as needed for nausea. 11/15/15   Ghimire, Werner LeanShanker M, MD  polyethylene glycol (MIRALAX / Ethelene HalGLYCOLAX) packet Take 17 g by mouth daily. 11/15/15   Ghimire, Werner LeanShanker M, MD  rosuvastatin (CRESTOR) 5 MG tablet Take 1 tablet (5 mg total) by mouth daily. 12/29/15   Lance MussVaranasi, Jayadeep  S, MD  senna-docusate (SENOKOT-S) 8.6-50 MG tablet Take 1 tablet by mouth at bedtime as needed for mild constipation. 11/15/15   Ghimire, Werner Lean, MD  traMADol (ULTRAM) 50 MG tablet Take 1 tablet (50 mg total) by mouth every 6 (six) hours as needed for moderate pain. 11/15/15   Ghimire, Werner Lean, MD  Vitamin D, Ergocalciferol, (DRISDOL) 50000 units CAPS capsule Take 50,000 Units by mouth every 7 (seven) days.    [provider]    Family History Family History  Problem Relation Age of Onset   Lung cancer Father    CAD Father    CAD Brother    Heart attack Neg Hx    Hypertension Neg Hx    Stroke Neg Hx     Social History Social History   Tobacco  Use   Smoking status: Former Smoker    Last attempt to quit: 05/18/1941    Years since quitting: 77.4   Smokeless tobacco: Never Used  Substance Use Topics   Alcohol use: No   Drug use: No     Allergies   Patient has no known allergies.   Review of Systems Review of Systems  Cardiovascular: Negative for chest pain.  Gastrointestinal: Negative for abdominal pain.  All other systems reviewed and are negative.    Physical Exam Updated Vital Signs BP (!) 192/92 (BP Location: Right Arm)    Pulse 77    Temp 98 F (36.7 C) (Oral)    Resp 16    SpO2 98%   Physical Exam Vitals signs and nursing note reviewed.  Constitutional:      Appearance: He is well-developed.  HENT:     Head: Normocephalic.     Comments: 2cm laceration nose, gapping.  Large abrasion forehead.   Abrasions left knee,  Abrasions hands   Neck:     Musculoskeletal: Normal range of motion.  Pulmonary:     Effort: Pulmonary effort is normal.  Abdominal:     General: There is no distension.  Musculoskeletal: Normal range of motion.  Neurological:     Mental Status: He is alert and oriented to person, place, and time.      ED Treatments / Results  Labs (all labs ordered are listed, but only abnormal results are displayed) Labs Reviewed - No data to display  EKG EKG Interpretation  Date/Time:  Sunday September 24 2018 16:04:02 EDT Ventricular Rate:  75 PR Interval:    QRS Duration: 102 QT Interval:  405 QTC Calculation: 453 R Axis:   0 Text Interpretation:  Sinus rhythm Prolonged PR interval No significant change since last tracing Confirmed by Jacalyn Lefevre [phone]) on 09/24/2018 4:20:10 PM   Radiology Ct Head Wo Contrast  Result Date: 09/24/2018 CLINICAL DATA:  Tripped over his cane and fell. Facial and forehead abrasions. EXAM: CT HEAD WITHOUT CONTRAST CT MAXILLOFACIAL WITHOUT CONTRAST CT CERVICAL SPINE WITHOUT CONTRAST TECHNIQUE: Multidetector CT imaging of the head, cervical spine, and  maxillofacial structures were performed using the standard protocol without intravenous contrast. Multiplanar CT image reconstructions of the cervical spine and maxillofacial structures were also generated. COMPARISON:  Head CT 12/18/2015 FINDINGS: CT HEAD FINDINGS Brain: There is no evidence for acute hemorrhage, hydrocephalus, mass lesion, or abnormal extra-axial fluid collection. No definite CT evidence for acute infarction. Diffuse loss of parenchymal volume is consistent with atrophy. Patchy low attenuation in the deep hemispheric and periventricular white matter is nonspecific, but likely reflects chronic microvascular ischemic demyelination. Vascular: No hyperdense vessel or unexpected calcification.  Skull: No evidence for fracture. No worrisome lytic or sclerotic lesion. Other: None. CT MAXILLOFACIAL FINDINGS Osseous: Minimally displaced nasal bone fractures evident. No maxillary sinus or zygomatic arch fracture medial and inferior orbital walls are intact. Temporomandibular joints are located. Mandible is intact. Orbits: Negative. No traumatic or inflammatory finding. Sinuses: Clear. Soft tissues: Contusion noted left paramidline frontal scout period CT CERVICAL SPINE FINDINGS Alignment: Reversal of normal cervical lordosis without subluxation. Skull base and vertebrae: No acute fracture. No primary bone lesion or focal pathologic process. Soft tissues and spinal canal: No prevertebral fluid or swelling. No visible canal hematoma. Disc levels: Loss of disc height is essentially complete at all levels from C2-3 down to C6-7 with associated endplate degeneration. Upper chest: Negative. Other: None. IMPRESSION: 1. No acute intracranial abnormality. Atrophy with chronic small vessel white matter ischemic disease. 2. Minimally displaced fractures of the anterior nasal bones. No other acute maxillofacial fracture evident. 3. Soft tissue swelling/contusion left paramidline frontal scalp. 4. No cervical spine  fracture. Loss of cervical lordosis. This can be related to patient positioning, muscle spasm or soft tissue injury. 5. Diffuse degenerative disc disease from C2-3 down to C6-7. Electronically Signed   By: Kennith Center M.D.   On: 09/24/2018 17:23   Ct Cervical Spine Wo Contrast  Result Date: 09/24/2018 CLINICAL DATA:  Tripped over his cane and fell. Facial and forehead abrasions. EXAM: CT HEAD WITHOUT CONTRAST CT MAXILLOFACIAL WITHOUT CONTRAST CT CERVICAL SPINE WITHOUT CONTRAST TECHNIQUE: Multidetector CT imaging of the head, cervical spine, and maxillofacial structures were performed using the standard protocol without intravenous contrast. Multiplanar CT image reconstructions of the cervical spine and maxillofacial structures were also generated. COMPARISON:  Head CT 12/18/2015 FINDINGS: CT HEAD FINDINGS Brain: There is no evidence for acute hemorrhage, hydrocephalus, mass lesion, or abnormal extra-axial fluid collection. No definite CT evidence for acute infarction. Diffuse loss of parenchymal volume is consistent with atrophy. Patchy low attenuation in the deep hemispheric and periventricular white matter is nonspecific, but likely reflects chronic microvascular ischemic demyelination. Vascular: No hyperdense vessel or unexpected calcification. Skull: No evidence for fracture. No worrisome lytic or sclerotic lesion. Other: None. CT MAXILLOFACIAL FINDINGS Osseous: Minimally displaced nasal bone fractures evident. No maxillary sinus or zygomatic arch fracture medial and inferior orbital walls are intact. Temporomandibular joints are located. Mandible is intact. Orbits: Negative. No traumatic or inflammatory finding. Sinuses: Clear. Soft tissues: Contusion noted left paramidline frontal scout period CT CERVICAL SPINE FINDINGS Alignment: Reversal of normal cervical lordosis without subluxation. Skull base and vertebrae: No acute fracture. No primary bone lesion or focal pathologic process. Soft tissues and  spinal canal: No prevertebral fluid or swelling. No visible canal hematoma. Disc levels: Loss of disc height is essentially complete at all levels from C2-3 down to C6-7 with associated endplate degeneration. Upper chest: Negative. Other: None. IMPRESSION: 1. No acute intracranial abnormality. Atrophy with chronic small vessel white matter ischemic disease. 2. Minimally displaced fractures of the anterior nasal bones. No other acute maxillofacial fracture evident. 3. Soft tissue swelling/contusion left paramidline frontal scalp. 4. No cervical spine fracture. Loss of cervical lordosis. This can be related to patient positioning, muscle spasm or soft tissue injury. 5. Diffuse degenerative disc disease from C2-3 down to C6-7. Electronically Signed   By: Kennith Center M.D.   On: 09/24/2018 17:23   Ct Maxillofacial Wo Contrast  Result Date: 09/24/2018 CLINICAL DATA:  Tripped over his cane and fell. Facial and forehead abrasions. EXAM: CT HEAD WITHOUT CONTRAST CT MAXILLOFACIAL WITHOUT  CONTRAST CT CERVICAL SPINE WITHOUT CONTRAST TECHNIQUE: Multidetector CT imaging of the head, cervical spine, and maxillofacial structures were performed using the standard protocol without intravenous contrast. Multiplanar CT image reconstructions of the cervical spine and maxillofacial structures were also generated. COMPARISON:  Head CT 12/18/2015 FINDINGS: CT HEAD FINDINGS Brain: There is no evidence for acute hemorrhage, hydrocephalus, mass lesion, or abnormal extra-axial fluid collection. No definite CT evidence for acute infarction. Diffuse loss of parenchymal volume is consistent with atrophy. Patchy low attenuation in the deep hemispheric and periventricular white matter is nonspecific, but likely reflects chronic microvascular ischemic demyelination. Vascular: No hyperdense vessel or unexpected calcification. Skull: No evidence for fracture. No worrisome lytic or sclerotic lesion. Other: None. CT MAXILLOFACIAL FINDINGS Osseous:  Minimally displaced nasal bone fractures evident. No maxillary sinus or zygomatic arch fracture medial and inferior orbital walls are intact. Temporomandibular joints are located. Mandible is intact. Orbits: Negative. No traumatic or inflammatory finding. Sinuses: Clear. Soft tissues: Contusion noted left paramidline frontal scout period CT CERVICAL SPINE FINDINGS Alignment: Reversal of normal cervical lordosis without subluxation. Skull base and vertebrae: No acute fracture. No primary bone lesion or focal pathologic process. Soft tissues and spinal canal: No prevertebral fluid or swelling. No visible canal hematoma. Disc levels: Loss of disc height is essentially complete at all levels from C2-3 down to C6-7 with associated endplate degeneration. Upper chest: Negative. Other: None. IMPRESSION: 1. No acute intracranial abnormality. Atrophy with chronic small vessel white matter ischemic disease. 2. Minimally displaced fractures of the anterior nasal bones. No other acute maxillofacial fracture evident. 3. Soft tissue swelling/contusion left paramidline frontal scalp. 4. No cervical spine fracture. Loss of cervical lordosis. This can be related to patient positioning, muscle spasm or soft tissue injury. 5. Diffuse degenerative disc disease from C2-3 down to C6-7. Electronically Signed   By: Kennith Center M.D.   On: 09/24/2018 17:23    Procedures .Marland KitchenLaceration Repair Date/Time: 09/24/2018 10:38 PM Performed by: Elson Areas, PA-C Authorized by: Elson Areas, PA-C   Consent:    Consent obtained:  Verbal   Consent given by:  Patient   Risks discussed:  Infection, need for additional repair, pain, poor cosmetic result and poor wound healing   Alternatives discussed:  No treatment and delayed treatment Universal protocol:    Procedure explained and questions answered to patient or proxy's satisfaction: yes     Relevant documents present and verified: yes     Test results available and properly  labeled: yes     Imaging studies available: yes     Required blood products, implants, devices, and special equipment available: yes     Site/side marked: yes     Immediately prior to procedure, a time out was called: yes     Patient identity confirmed:  Verbally with patient Anesthesia (see MAR for exact dosages):    Anesthesia method:  Local infiltration   Local anesthetic:  Lidocaine 2% w/o epi Laceration details:    Location:  Face   Face location:  Nose   Length (cm):  2.5   Depth (mm):  2 Repair type:    Repair type:  Simple Pre-procedure details:    Preparation:  Patient was prepped and draped in usual sterile fashion Exploration:    Contaminated: no   Treatment:    Area cleansed with:  Betadine   Irrigation solution:  Sterile saline Skin repair:    Repair method:  Sutures   Suture size:  6-0   Suture technique:  Simple interrupted   Number of sutures:  6 Approximation:    Approximation:  Loose Post-procedure details:    Dressing:  Non-adherent dressing   Patient tolerance of procedure:  Tolerated well, no immediate complications   (including critical care time)  Medications Ordered in ED Medications  lidocaine (XYLOCAINE) 2 % (with pres) injection 200 mg (200 mg Infiltration Given by Other 09/24/18 1700)     Initial Impression / Assessment and Plan / ED Course  I have reviewed the triage vital signs and the nursing notes.  Pertinent labs & imaging results that were available during my care of the patient were reviewed by me and considered in my medical decision making (see chart for details).        MDM   Dr. Particia Nearing in to see and examine.  Xray review and fracture and laceration care discussed with pt.  Pt advised to follow up with ENT if any problems.   Final Clinical Impressions(s) / ED Diagnoses   Final diagnoses:  Contusion of face, initial encounter  Open fracture of nasal bone, initial encounter  Laceration of nose, initial encounter    ED  Discharge Orders         Ordered    amoxicillin-clavulanate (AUGMENTIN) 875-125 MG tablet  Every 12 hours     09/24/18 1822        An After Visit Summary was printed and given to the patient.    Osie Cheeks 09/24/18 2240    Jacalyn Lefevre, MD 09/27/18 [phone]

## 2018-09-24 NOTE — ED Triage Notes (Signed)
Pt was walking in [employer] parking lot, fell and hit his face on the curb. Pt states when he walks long distances he uses his walker. Today, he only had his cane. He said after walking to the [employer] [facility] and back to his car, his legs got weak and he fell. Pt has lac to his forehead and nose. No LOC.

## 2018-09-24 NOTE — Discharge Instructions (Signed)
Suture removal in 8 days  °

## 2018-09-24 NOTE — ED Notes (Signed)
Pt son at bedside.

## 2018-09-24 NOTE — ED Notes (Signed)
Patient verbalizes understanding of discharge instructions. Opportunity for questioning and answers were provided. Armband removed by staff, pt discharged from ED in wheelchair.  

## 2018-10-02 ENCOUNTER — Other Ambulatory Visit: Payer: Self-pay

## 2018-10-02 ENCOUNTER — Ambulatory Visit (HOSPITAL_COMMUNITY)
Admission: EM | Admit: 2018-10-02 | Discharge: 2018-10-02 | Disposition: A | Discharge status: Home / Self Care | Payer: Medicare PPO

## 2018-10-02 DIAGNOSIS — Z4802 Encounter for removal of sutures: Secondary | ICD-10-CM | POA: Diagnosis not present

## 2018-10-02 NOTE — ED Triage Notes (Signed)
Pt here for removal of stitches to the bridge of his nose that were placed 8 days ago.  The wounds are clean and dry.  Scabbing had to be removed to assess the wound.  The wound was approximated and 6 stitches were removed.  Pt tolerated it well.  He denies any pain, fever, or any signs of infection.  Pt instructed to keep the wound clean and he can shower as before.  Pt was A&O upon leaving.

## 2018-11-22 ENCOUNTER — Encounter: Payer: Self-pay | Admitting: Internal Medicine

## 2018-12-20 DIAGNOSIS — E785 Hyperlipidemia, unspecified: Secondary | ICD-10-CM | POA: Diagnosis not present

## 2018-12-20 DIAGNOSIS — E559 Vitamin D deficiency, unspecified: Secondary | ICD-10-CM | POA: Diagnosis not present

## 2018-12-20 DIAGNOSIS — S069X9A Unspecified intracranial injury with loss of consciousness of unspecified duration, initial encounter: Secondary | ICD-10-CM | POA: Diagnosis not present

## 2018-12-20 DIAGNOSIS — Z23 Encounter for immunization: Secondary | ICD-10-CM | POA: Diagnosis not present

## 2018-12-20 DIAGNOSIS — I2510 Atherosclerotic heart disease of native coronary artery without angina pectoris: Secondary | ICD-10-CM | POA: Diagnosis not present

## 2018-12-20 DIAGNOSIS — Z0000 Encounter for general adult medical examination without abnormal findings: Secondary | ICD-10-CM | POA: Diagnosis not present

## 2018-12-20 DIAGNOSIS — R001 Bradycardia, unspecified: Secondary | ICD-10-CM | POA: Diagnosis not present

## 2018-12-20 DIAGNOSIS — R55 Syncope and collapse: Secondary | ICD-10-CM | POA: Diagnosis not present

## 2018-12-20 DIAGNOSIS — I10 Essential (primary) hypertension: Secondary | ICD-10-CM | POA: Diagnosis not present

## 2018-12-22 ENCOUNTER — Telehealth: Payer: Self-pay | Admitting: Internal Medicine

## 2018-12-22 NOTE — Telephone Encounter (Signed)
Pt's son called and talked to [place] in scheduling. Pt having HR of 29 bpm. He is requesting a [facility]. Dr. Lovena Le out until 7-24, EP App's not seeing pt's until Aug. Is this ok for gen APP? Pls advise  Son Sharman Crate [phone]

## 2018-12-22 NOTE — Telephone Encounter (Signed)
Spoke with the patient, he stated the 29 bpm came from a call from the monitor company. Dr. Fara Olden ordered. I am unable to access the information. Left a message with her office to send the records. The patient took his vitals today, BP: 134/94, HR: 72. He feels good today. He denies feeling weak or tired the past few days. I advised the patient to call the office if he feel any of those symptoms or if his heart rate drops. He is scheduled with Dr. Lovena Le on 01/05/19.

## 2019-01-02 ENCOUNTER — Telehealth: Payer: Self-pay | Admitting: Internal Medicine

## 2019-01-02 NOTE — Telephone Encounter (Signed)
Left detailed message per DPR.  Dr. Lovena Le has reviewed this monitor.  Pt low heart rate was when Pt was sleeping.  Pt is not symptomatic with low heart rate.  Per Dr. Lovena Le watchful waiting.  Advised son he does not need appt made for Friday.  Advised to return this nurse phone call.  Also advised son this nurse had let PCP know.

## 2019-01-02 NOTE — Telephone Encounter (Signed)
New message:    Patient son calling to make sure the results for his father  monitor was received that he had to wear for two weeks. They have a appt on Friday and wanted to make sure the results are in. Please call patient son back.

## 2019-01-02 NOTE — Telephone Encounter (Signed)
Spoke with son.  Per son, Pt needs to discuss PPM d/t Pt with HTN that is unable to be treated d/t low heart rate.

## 2019-01-04 ENCOUNTER — Telehealth: Payer: Self-pay

## 2019-01-04 NOTE — Telephone Encounter (Signed)
Called to do COVID screening...no answer. 

## 2019-01-05 ENCOUNTER — Encounter: Payer: Self-pay | Admitting: Internal Medicine

## 2019-01-05 ENCOUNTER — Other Ambulatory Visit: Payer: Self-pay

## 2019-01-05 ENCOUNTER — Ambulatory Visit (INDEPENDENT_AMBULATORY_CARE_PROVIDER_SITE_OTHER): Payer: Medicare PPO | Admitting: Internal Medicine

## 2019-01-05 VITALS — BP 140/72 | HR 60 | Ht 64.0 in | Wt 135.6 lb

## 2019-01-05 DIAGNOSIS — I441 Atrioventricular block, second degree: Secondary | ICD-10-CM | POA: Diagnosis not present

## 2019-01-05 DIAGNOSIS — I10 Essential (primary) hypertension: Secondary | ICD-10-CM

## 2019-01-05 NOTE — Patient Instructions (Signed)
Medication Instructions:  Your physician recommends that you continue on your current medications as directed. Please refer to the Current Medication list given to you today.  Labwork: None ordered.  Testing/Procedures: None ordered.  Follow-Up: Your physician wants you to follow-up in: as needed with Dr. Taylor.      Any Other Special Instructions Will Be Listed Below (If Applicable).  If you need a refill on your cardiac medications before your next appointment, please call your pharmacy.   

## 2019-01-05 NOTE — Progress Notes (Signed)
HPI Leon Morris returns today for ongoing evaluation and management of asymptomatic bradycardia and HTN. He has been given a prescription for hydralazine which has not yet been filled. He has a h/o nocturnal bradycardia. He was last seen by me 10 months ago.  No Known Allergies   Current Outpatient Medications  Medication Sig Dispense Refill  . acetaminophen (TYLENOL) 500 MG tablet Take 2 tablets (1,000 mg total) by mouth every 8 (eight) hours. 30 tablet 0  . amoxicillin-clavulanate (AUGMENTIN) 875-125 MG tablet Take 1 tablet by mouth every 12 (twelve) hours. 14 tablet 0  . citalopram (CELEXA) 20 MG tablet Take 20 mg by mouth daily.     . clopidogrel (PLAVIX) 75 MG tablet TAKE 1/2 TABLET BY MOUTH EVERY DAY WITH BREAKFAST 30 tablet 8  . nitroGLYCERIN (NITROSTAT) 0.4 MG SL tablet Place 0.4 mg under the tongue every 5 (five) minutes as needed for chest pain (UP TO 3 DOSES BEFORE CALLING 911).    . ondansetron (ZOFRAN) 4 MG tablet Take 1 tablet (4 mg total) by mouth every 6 (six) hours as needed for nausea. 20 tablet 0  . polyethylene glycol (MIRALAX / GLYCOLAX) packet Take 17 g by mouth daily. 14 each 0  . rosuvastatin (CRESTOR) 5 MG tablet Take 1 tablet (5 mg total) by mouth daily. 90 tablet 2  . senna-docusate (SENOKOT-S) 8.6-50 MG tablet Take 1 tablet by mouth at bedtime as needed for mild constipation.    . traMADol (ULTRAM) 50 MG tablet Take 1 tablet (50 mg total) by mouth every 6 (six) hours as needed for moderate pain. 30 tablet 0  . Vitamin D, Ergocalciferol, (DRISDOL) 50000 units CAPS capsule Take 50,000 Units by mouth every 7 (seven) days.     No current facility-administered medications for this visit.    Facility-Administered Medications Ordered in Other Visits  Medication Dose Route Frequency Provider Last Rate Last Dose  . lactated ringers infusion    Continuous PRN Luster Landsberghase, Tonja R, CRNA         Past Medical History:  Diagnosis Date  . Anxiety   . Arthritis    LOWER BACK PAIN   . CAD (coronary artery disease)   . Hypercholesterolemia   . Hypertension 06/16/2011  . NSTEMI (non-ST elevated myocardial infarction) (HCC)    06/16/2011   DR [place]     ROS:   All systems reviewed and negative except as noted in the HPI.   Past Surgical History:  Procedure Laterality Date  . CARDIAC CATHETERIZATION    . CATARACT EXTRACTION W/ INTRAOCULAR LENS  IMPLANT, BILATERAL    . CORONARY ANGIOPLASTY     06/2011   DR [place]   . HERNIA REPAIR    . JOINT REPLACEMENT    . KNEE SURGERY    . LEFT HEART CATHETERIZATION WITH CORONARY ANGIOGRAM N/A 06/13/2011   Procedure: LEFT HEART CATHETERIZATION WITH CORONARY ANGIOGRAM;  Surgeon: Peter M SwazilandJordan, MD;  [facility];  Service: Cardiovascular;  Laterality: N/A;  . SHOULDER SURGERY       Family History  Problem Relation Age of Onset  . Lung cancer Father   . CAD Father   . CAD Brother   . Heart attack Neg Hx   . Hypertension Neg Hx   . Stroke Neg Hx      Social History   Socioeconomic History  . Marital status: Widowed    Spouse name: Not on file  . Number of children: Not on file  .  Years of education: Not on file  . Highest education level: Not on file  Occupational History  . Not on file  Social Needs  . Financial resource strain: Not on file  . Food insecurity    Worry: Not on file    Inability: Not on file  . Transportation needs    Medical: Not on file    Non-medical: Not on file  Tobacco Use  . Smoking status: Former Smoker    Quit date: 05/18/1941    Years since quitting: 77.6  . Smokeless tobacco: Never Used  Substance and Sexual Activity  . Alcohol use: No  . Drug use: No  . Sexual activity: Never  Lifestyle  . Physical activity    Days per week: Not on file    Minutes per session: Not on file  . Stress: Not on file  Relationships  . Social Herbalist on phone: Not on file    Gets together: Not on file    Attends religious service: Not on file    Active  member of club or organization: Not on file    Attends meetings of clubs or organizations: Not on file    Relationship status: Not on file  . Intimate partner violence    Fear of current or ex partner: Not on file    Emotionally abused: Not on file    Physically abused: Not on file    Forced sexual activity: Not on file  Other Topics Concern  . Not on file  Social History Narrative  . Not on file     BP 140/72   Pulse 60   Ht 5\' 4"  (1.626 m)   Wt 135 lb 9.6 oz (61.5 kg)   SpO2 98%   BMI 23.28 kg/m   Physical Exam:  Well appearing NAD HEENT: Unremarkable Neck:  No JVD, no thyromegally Lymphatics:  No adenopathy Back:  No CVA tenderness Lungs:  Clear with no wheezes HEART:  Regular rate rhythm, no murmurs, no rubs, no clicks Abd:  soft, positive bowel sounds, no organomegally, no rebound, no guarding Ext:  2 plus pulses, no edema, no cyanosis, no clubbing Skin:  No rashes no nodules Neuro:  CN II through XII intact, motor grossly intact  EKG - sinus bradycardia    Assess/Plan: 1.sinus node dysfunction - as the patient remains asymptomatic, I have recommended a period of watchful waiting. He will avoid AV and sinus node blocking drugs. He will call us if he develops symptoms of bradycardia and we reviewed those today. 2. HTN - I agree with initiation of hydralizine for his HTN.  3. Mobitz 2 second degree AV block - none on his ECG today. He is asymptomatic.  Mikle Bosworth.D.

## 2019-01-25 ENCOUNTER — Emergency Department (HOSPITAL_COMMUNITY): Payer: Medicare PPO

## 2019-01-25 ENCOUNTER — Encounter (HOSPITAL_COMMUNITY): Payer: Self-pay | Admitting: Emergency Medicine

## 2019-01-25 ENCOUNTER — Other Ambulatory Visit: Payer: Self-pay

## 2019-01-25 ENCOUNTER — Emergency Department (HOSPITAL_COMMUNITY)
Admission: EM | Admit: 2019-01-25 | Discharge: 2019-01-25 | Disposition: A | Discharge status: Home / Self Care | Payer: Medicare PPO | Attending: Emergency Medicine | Admitting: Emergency Medicine

## 2019-01-25 DIAGNOSIS — Z7901 Long term (current) use of anticoagulants: Secondary | ICD-10-CM | POA: Insufficient documentation

## 2019-01-25 DIAGNOSIS — R531 Weakness: Secondary | ICD-10-CM | POA: Diagnosis not present

## 2019-01-25 DIAGNOSIS — R42 Dizziness and giddiness: Secondary | ICD-10-CM | POA: Diagnosis not present

## 2019-01-25 DIAGNOSIS — I2510 Atherosclerotic heart disease of native coronary artery without angina pectoris: Secondary | ICD-10-CM | POA: Diagnosis not present

## 2019-01-25 DIAGNOSIS — R110 Nausea: Secondary | ICD-10-CM | POA: Insufficient documentation

## 2019-01-25 DIAGNOSIS — I10 Essential (primary) hypertension: Secondary | ICD-10-CM | POA: Diagnosis not present

## 2019-01-25 DIAGNOSIS — I252 Old myocardial infarction: Secondary | ICD-10-CM | POA: Diagnosis not present

## 2019-01-25 DIAGNOSIS — R0689 Other abnormalities of breathing: Secondary | ICD-10-CM | POA: Diagnosis not present

## 2019-01-25 LAB — URINALYSIS, ROUTINE W REFLEX MICROSCOPIC
Bacteria, UA: NONE SEEN
Bilirubin Urine: NEGATIVE
Glucose, UA: NEGATIVE mg/dL
Ketones, ur: NEGATIVE mg/dL
Nitrite: NEGATIVE
Protein, ur: 30 mg/dL — AB
Specific Gravity, Urine: 1.02 (ref 1.005–1.030)
pH: 5 (ref 5.0–8.0)

## 2019-01-25 LAB — CBC WITH DIFFERENTIAL/PLATELET
Abs Immature Granulocytes: 0.05 10*3/uL (ref 0.00–0.07)
Basophils Absolute: 0 10*3/uL (ref 0.0–0.1)
Basophils Relative: 0 %
Eosinophils Absolute: 0.1 10*3/uL (ref 0.0–0.5)
Eosinophils Relative: 1 %
HCT: 34.1 % — ABNORMAL LOW (ref 39.0–52.0)
Hemoglobin: 11.5 g/dL — ABNORMAL LOW (ref 13.0–17.0)
Immature Granulocytes: 1 %
Lymphocytes Relative: 3 %
Lymphs Abs: 0.2 10*3/uL — ABNORMAL LOW (ref 0.7–4.0)
MCH: 32.2 pg (ref 26.0–34.0)
MCHC: 33.7 g/dL (ref 30.0–36.0)
MCV: 95.5 fL (ref 80.0–100.0)
Monocytes Absolute: 0.7 10*3/uL (ref 0.1–1.0)
Monocytes Relative: 8 %
Neutro Abs: 7.1 10*3/uL (ref 1.7–7.7)
Neutrophils Relative %: 87 %
Platelets: 205 10*3/uL (ref 150–400)
RBC: 3.57 MIL/uL — ABNORMAL LOW (ref 4.22–5.81)
RDW: 13.7 % (ref 11.5–15.5)
WBC: 8.1 10*3/uL (ref 4.0–10.5)
nRBC: 0 % (ref 0.0–0.2)

## 2019-01-25 LAB — COMPREHENSIVE METABOLIC PANEL
ALT: 94 U/L — ABNORMAL HIGH (ref 0–44)
AST: 147 U/L — ABNORMAL HIGH (ref 15–41)
Albumin: 3.2 g/dL — ABNORMAL LOW (ref 3.5–5.0)
Alkaline Phosphatase: 90 U/L (ref 38–126)
Anion gap: 9 (ref 5–15)
BUN: 26 mg/dL — ABNORMAL HIGH (ref 8–23)
CO2: 24 mmol/L (ref 22–32)
Calcium: 8.3 mg/dL — ABNORMAL LOW (ref 8.9–10.3)
Chloride: 99 mmol/L (ref 98–111)
Creatinine, Ser: 1.83 mg/dL — ABNORMAL HIGH (ref 0.61–1.24)
GFR calc Af Amer: 36 mL/min — ABNORMAL LOW (ref >60)
GFR calc non Af Amer: 31 mL/min — ABNORMAL LOW (ref >60)
Glucose, Bld: 136 mg/dL — ABNORMAL HIGH (ref 70–99)
Potassium: 3.5 mmol/L (ref 3.5–5.1)
Sodium: 132 mmol/L — ABNORMAL LOW (ref 135–145)
Total Bilirubin: 1.1 mg/dL (ref 0.3–1.2)
Total Protein: 5.7 g/dL — ABNORMAL LOW (ref 6.5–8.1)

## 2019-01-25 LAB — LIPASE, BLOOD: Lipase: 21 U/L (ref 11–51)

## 2019-01-25 MED ORDER — ONDANSETRON HCL 4 MG/2ML IJ SOLN
4.0000 mg | Freq: Once | INTRAMUSCULAR | Status: AC
  Administered 2019-01-25: 4 mg via INTRAVENOUS
  Filled 2019-01-25: qty 2

## 2019-01-25 MED ORDER — SODIUM CHLORIDE 0.9 % IV BOLUS
1000.0000 mL | Freq: Once | INTRAVENOUS | Status: AC
  Administered 2019-01-25: 1000 mL via INTRAVENOUS

## 2019-01-25 NOTE — ED Triage Notes (Signed)
Pt arrives to ED from home with [facility] with complaints of dizziness, weakness, and nausea for a couple of days. Patient denies any pain at this time.

## 2019-01-25 NOTE — ED Notes (Signed)
Pt is IN MRI upon my arrival to assignment per previous nurse

## 2019-01-25 NOTE — ED Provider Notes (Signed)
[facility] EMERGENCY DEPARTMENT Provider Note   CSN: [id number] Arrival date & time: 01/25/19  0901     History   Chief Complaint Chief Complaint  Patient presents with  . Weakness  . Dizziness    HPI Leon Morris is a 85 y.o. male with past medical history of CAD, hypertension, hyperlipidemia, and Mobitz type II second-degree heart block presents emergency department today with chief complaint of weakness and dizziness x2 days.  Patient states dizziness has been intermittent.  He spends a lot of time sitting outside on the porch and he states he feels dizzy when sitting. He does not notice that dizziness is worse with movement or changing positions. He describes the dizziness as his surroundings are spinning. He took meclizine yesterday with symptom relief. He admits to associated nausea without emesis. He denies any recent falls. Also denies fever, chills, cough, shortness of breath, chest pain, abdominal pain, headache, visual changes.  Pt's daughter reports he has history of vertigo that he started taking meclizine for x1 month ago. Pt is not anticoagulated.  History provided by patient with additional history obtained from chart review.    Past Medical History:  Diagnosis Date  . Anxiety   . Arthritis    LOWER BACK PAIN   . CAD (coronary artery disease)   . Hypercholesterolemia   . Hypertension 06/16/2011  . NSTEMI (non-ST elevated myocardial infarction) ([place])    06/16/2011   DR [place]     Patient Active Problem List   Diagnosis Date Noted  . Mobitz type 2 second degree heart block 11/09/2017  . Acetabular fracture ([place]) 11/13/2015  . Pelvic fracture ([place]) 11/13/2015  . Old MI (myocardial infarction) 10/29/2015  . Pseudoaneurysm ([place]) 07/02/2011  . Coronary artery disease 06/16/2011  . Hypertension 06/16/2011  . Hyperlipidemia 06/16/2011    Past Surgical History:  Procedure Laterality Date  . CARDIAC CATHETERIZATION    . CATARACT EXTRACTION W/  INTRAOCULAR LENS  IMPLANT, BILATERAL    . CORONARY ANGIOPLASTY     06/2011   DR [place]   . HERNIA REPAIR    . JOINT REPLACEMENT    . KNEE SURGERY    . LEFT HEART CATHETERIZATION WITH CORONARY ANGIOGRAM N/A 06/13/2011   Procedure: LEFT HEART CATHETERIZATION WITH CORONARY ANGIOGRAM;  Surgeon: Peter M Martinique, MD;  Location: [facility] CATH LAB;  Service: Cardiovascular;  Laterality: N/A;  . SHOULDER SURGERY          Home Medications    Prior to Admission medications   Medication Sig Start Date End Date Taking? Authorizing Provider  acetaminophen (TYLENOL) 500 MG tablet Take 2 tablets (1,000 mg total) by mouth every 8 (eight) hours. 11/15/15   Ghimire, Henreitta Leber, MD  amoxicillin-clavulanate (AUGMENTIN) 875-125 MG tablet Take 1 tablet by mouth every 12 (twelve) hours. 09/24/18   Fransico Meadow, PA-C  citalopram (CELEXA) 20 MG tablet Take 20 mg by mouth daily.     [provider]  clopidogrel (PLAVIX) 75 MG tablet TAKE 1/2 TABLET BY MOUTH EVERY DAY WITH BREAKFAST 10/06/15   Jettie Booze, MD  nitroGLYCERIN (NITROSTAT) 0.4 MG SL tablet Place 0.4 mg under the tongue every 5 (five) minutes as needed for chest pain (UP TO 3 DOSES BEFORE CALLING 911).    [provider]  ondansetron (ZOFRAN) 4 MG tablet Take 1 tablet (4 mg total) by mouth every 6 (six) hours as needed for nausea. 11/15/15   Ghimire, Henreitta Leber, MD  polyethylene glycol (MIRALAX / Floria Raveling)  packet Take 17 g by mouth daily. 11/15/15   Ghimire, Werner LeanShanker M, MD  rosuvastatin (CRESTOR) 5 MG tablet Take 1 tablet (5 mg total) by mouth daily. 12/29/15   Corky CraftsVaranasi, Jayadeep S, MD  senna-docusate (SENOKOT-S) 8.6-50 MG tablet Take 1 tablet by mouth at bedtime as needed for mild constipation. 11/15/15   Ghimire, Werner LeanShanker M, MD  traMADol (ULTRAM) 50 MG tablet Take 1 tablet (50 mg total) by mouth every 6 (six) hours as needed for moderate pain. 11/15/15   Ghimire, Werner LeanShanker M, MD  Vitamin D, Ergocalciferol, (DRISDOL) 50000 units CAPS capsule Take  50,000 Units by mouth every 7 (seven) days.    [provider]    Family History Family History  Problem Relation Age of Onset  . Lung cancer Father   . CAD Father   . CAD Brother   . Heart attack Neg Hx   . Hypertension Neg Hx   . Stroke Neg Hx     Social History Social History   Tobacco Use  . Smoking status: Former Smoker    Quit date: 05/18/1941    Years since quitting: 77.7  . Smokeless tobacco: Never Used  Substance Use Topics  . Alcohol use: No  . Drug use: No     Allergies   Patient has no known allergies.   Review of Systems Review of Systems  Constitutional: Negative for chills and fever.  HENT: Negative for congestion, rhinorrhea, sinus pressure and sore throat.   Eyes: Negative for pain and redness.  Respiratory: Negative for cough, shortness of breath and wheezing.   Cardiovascular: Negative for chest pain and palpitations.  Gastrointestinal: Positive for nausea. Negative for abdominal pain, constipation, diarrhea and vomiting.  Genitourinary: Negative for dysuria.  Musculoskeletal: Negative for arthralgias, back pain, myalgias and neck pain.  Skin: Negative for rash and wound.  Neurological: Positive for dizziness. Negative for syncope, weakness, numbness and headaches.  Psychiatric/Behavioral: Negative for confusion.     Physical Exam Updated Vital Signs BP (!) 173/72 (BP Location: Right Arm)   Pulse 69   Temp 97.7 F (36.5 C) (Oral)   Resp 20   SpO2 95%   Physical Exam Vitals signs and nursing note reviewed.  Constitutional:      General: He is not in acute distress.    Appearance: He is not ill-appearing.  HENT:     Head: Normocephalic and atraumatic.     Right Ear: Tympanic membrane and external ear normal.     Left Ear: Tympanic membrane and external ear normal.     Nose: Nose normal.     Mouth/Throat:     Mouth: Mucous membranes are moist.     Pharynx: Oropharynx is clear.  Eyes:     General: No scleral icterus.        Right eye: No discharge.        Left eye: No discharge.     Extraocular Movements: Extraocular movements intact.     Conjunctiva/sclera: Conjunctivae normal.     Pupils: Pupils are equal, round, and reactive to light.  Neck:     Musculoskeletal: Normal range of motion.     Vascular: No JVD.  Cardiovascular:     Rate and Rhythm: Normal rate and regular rhythm.     Pulses: Normal pulses.          Radial pulses are 2+ on the right side and 2+ on the left side.     Heart sounds: Normal heart sounds.  Pulmonary:  Comments: Lungs clear to auscultation in all fields. Symmetric chest rise. No wheezing, rales, or rhonchi. Abdominal:     Comments: Abdomen is soft, non-distended, and non-tender in all quadrants. No rigidity, no guarding. No peritoneal signs.  Musculoskeletal: Normal range of motion.  Skin:    General: Skin is warm and dry.     Capillary Refill: Capillary refill takes less than 2 seconds.  Neurological:     Mental Status: He is oriented to person, place, and time.     GCS: GCS eye subscore is 4. GCS verbal subscore is 5. GCS motor subscore is 6.     Comments: Mental Status:  Alert, oriented, thought content appropriate, able to give a coherent history. Speech fluent without evidence of aphasia. Able to follow 2 step commands without difficulty.  Cranial Nerves:  II:  Peripheral visual fields grossly normal, pupils equal, round, reactive to light III,IV, VI: ptosis not present, extra-ocular motions intact bilaterally  V,VII: smile symmetric, facial light touch sensation equal VIII: hearing grossly normal to voice  X: uvula elevates symmetrically  XI: bilateral shoulder shrug symmetric and strong XII: midline tongue extension without fassiculations Motor:  Normal tone. 5/5 in upper and lower extremities bilaterally including strong and equal grip strength and dorsiflexion/plantar flexion Sensory: Pinprick and light touch normal in all extremities.  Deep Tendon Reflexes: 2+  and symmetric in the biceps and patella Cerebellar: slow but normal finger-to-nose with bilateral upper extremities CV: distal pulses palpable throughout     Psychiatric:        Behavior: Behavior normal.      ED Treatments / Results  Labs (all labs ordered are listed, but only abnormal results are displayed) Labs Reviewed  COMPREHENSIVE METABOLIC PANEL - Abnormal; Notable for the following components:      Result Value   Sodium 132 (*)    Glucose, Bld 136 (*)    BUN 26 (*)    Creatinine, Ser 1.83 (*)    Calcium 8.3 (*)    Total Protein 5.7 (*)    Albumin 3.2 (*)    AST 147 (*)    ALT 94 (*)    GFR calc non Af Amer 31 (*)    GFR calc Af Amer 36 (*)    All other components within normal limits  CBC WITH DIFFERENTIAL/PLATELET - Abnormal; Notable for the following components:   RBC 3.57 (*)    Hemoglobin 11.5 (*)    HCT 34.1 (*)    Lymphs Abs 0.2 (*)    All other components within normal limits  URINALYSIS, ROUTINE W REFLEX MICROSCOPIC - Abnormal; Notable for the following components:   Hgb urine dipstick SMALL (*)    Protein, ur 30 (*)    Leukocytes,Ua TRACE (*)    All other components within normal limits  LIPASE, BLOOD    EKG EKG Interpretation  Date/Time:  Thursday January 25 2019 09:07:14 EDT Ventricular Rate:  71 PR Interval:    QRS Duration: 96 QT Interval:  419 QTC Calculation: 456 R Axis:   12 Text Interpretation:  Sinus rhythm Prolonged PR interval poor baseline Confirmed by Blane OharaZavitz, Joshua [phone]([id number]) on 01/25/2019 10:12:31 AM   Radiology Mr Brain Wo Contrast  Result Date: 01/25/2019 CLINICAL DATA:  Vertigo, episodic, peripheral EXAM: MRI HEAD WITHOUT CONTRAST TECHNIQUE: Multiplanar, multiecho pulse sequences of the brain and surrounding structures were obtained without intravenous contrast. COMPARISON:  Head CT and maxillofacial CT 09/24/2018 FINDINGS: Brain: No evidence of acute infarct. No evidence of intracranial mass. No  chronic intracranial  hemorrhage. No midline shift or extra-axial collection. Moderate scattered and confluent T2/FLAIR hyperintensity within the cerebral white matter is nonspecific, but consistent with chronic small vessel ischemic disease. Moderate generalized parenchymal atrophy. Vascular: Flow voids maintained within the proximal large vessels. The distal right vertebral artery is non dominant. Skull and upper cervical spine: Normal marrow signal. Reversal of the visualized cervical lordosis. Incompletely assessed cervical spondylosis with multilevel posterior disc osteophyte complexes. C3-C4 grade 1 retrolisthesis. Sinuses/Orbits: The imaged globes and orbits demonstrate no acute abnormality. Mild scattered paranasal sinus mucosal thickening. No significant mastoid effusion. IMPRESSION: No evidence of acute intracranial abnormality. Moderate generalized parenchymal atrophy and chronic small vessel ischemic disease. Incompletely assessed cervical spondylosis with multilevel posterior disc osteophytes. Electronically Signed   By: Jackey LogeKyle  Golden   On: 01/25/2019 11:45    Procedures Procedures (including critical care time)  Medications Ordered in ED Medications  sodium chloride 0.9 % bolus 1,000 mL (0 mLs Intravenous Stopped 01/25/19 1323)  ondansetron (ZOFRAN) injection 4 mg (4 mg Intravenous Given 01/25/19 0955)     Initial Impression / Assessment and Plan / ED Course  I have reviewed the triage vital signs and the nursing notes.  Pertinent labs & imaging results that were available during my care of the patient were reviewed by me and considered in my medical decision making (see chart for details).  85 yo male presents with dizziness and nausea.  He is overall well-appearing, no acute distress.  He was hypertensive on arrival however that improved without intervention when blood pressure rechecked. Work up initiated with UA, lipase, CBC, CMP. Will give IVF, zofran and MR brain. DDx includes vertigo, CVA, subdural.  MR brain is negative for acute findings, no signs of cva or bleeding. UA without signs of infection.  CMP shows slightly worsened creatinine, elevated AST and ALT. Serial abdominal exams are benign and no signs of jaundice on exam. CBC with hemoglobin of 11.5, stable compared to history. EKG without ischemic changes. On reassessment he reports feeling better after IVF. He is tolerating PO intake of water and crackers without difficulty. Pt ambulates with walker at home. I ambulated him around the room and he did so without difficulty. He denies dizziness. The patient was discussed with and seen by Dr. Jodi MourningZavitz who agrees with the treatment plan. The patient appears reasonably screened and/or stabilized for discharge and I doubt any other medical condition or other [facility] requiring further screening, evaluation, or treatment in the ED at this time prior to discharge. The patient is safe for discharge with strict return precautions discussed. Recommend pcp follow up to have liver enzymes rechecked./followed.  This note was prepared using Dragon voice recognition software and may include unintentional dictation errors due to the inherent limitations of voice recognition software.   Final Clinical Impressions(s) / ED Diagnoses   Final diagnoses:  Dizziness    ED Discharge Orders    None       Kathyrn Lasslbrizze, Kaitlyn E, PA-C 01/25/19 1430    Blane OharaZavitz, Joshua, MD 01/25/19 [phone]

## 2019-01-25 NOTE — Discharge Instructions (Addendum)
You have been seen today for dizziness. Please read and follow all provided instructions. Return to the emergency room for worsening condition or new concerning symptoms.    Your MRI today did not show any acute findings.  1. Medications: Continue usual home medications Take medications as prescribed. Please review all of the medicines and only take them if you do not have an allergy to them.   2. Treatment: rest, drink plenty of fluids  3. Follow Up: Please follow up with your primary doctor in 2-5 days for discussion of your diagnoses and further evaluation after today's visit; Call today to arrange your follow up.  -Your liver enzymes were elevated today. You should have this rechecked with your primary care doctor by having labs drawn  It is also a possibility that you have an allergic reaction to any of the medicines that you have been prescribed - Everybody reacts differently to medications and while MOST people have no trouble with most medicines, you may have a reaction such as nausea, vomiting, rash, swelling, shortness of breath. If this is the case, please stop taking the medicine immediately and contact your physician.  ?

## 2019-02-02 DIAGNOSIS — H8110 Benign paroxysmal vertigo, unspecified ear: Secondary | ICD-10-CM | POA: Diagnosis not present

## 2019-02-02 DIAGNOSIS — R55 Syncope and collapse: Secondary | ICD-10-CM | POA: Diagnosis not present

## 2019-02-02 DIAGNOSIS — I10 Essential (primary) hypertension: Secondary | ICD-10-CM | POA: Diagnosis not present

## 2019-02-02 DIAGNOSIS — Z23 Encounter for immunization: Secondary | ICD-10-CM | POA: Diagnosis not present

## 2019-02-02 DIAGNOSIS — R42 Dizziness and giddiness: Secondary | ICD-10-CM | POA: Diagnosis not present

## 2019-03-09 DIAGNOSIS — N3941 Urge incontinence: Secondary | ICD-10-CM | POA: Diagnosis not present

## 2019-03-09 DIAGNOSIS — R310 Gross hematuria: Secondary | ICD-10-CM | POA: Diagnosis not present

## 2019-03-09 DIAGNOSIS — N189 Chronic kidney disease, unspecified: Secondary | ICD-10-CM | POA: Diagnosis not present

## 2019-03-10 DIAGNOSIS — R32 Unspecified urinary incontinence: Secondary | ICD-10-CM | POA: Diagnosis not present

## 2019-03-10 DIAGNOSIS — R319 Hematuria, unspecified: Secondary | ICD-10-CM | POA: Diagnosis not present

## 2019-04-27 DIAGNOSIS — I25118 Atherosclerotic heart disease of native coronary artery with other forms of angina pectoris: Secondary | ICD-10-CM | POA: Diagnosis not present

## 2019-04-27 DIAGNOSIS — E785 Hyperlipidemia, unspecified: Secondary | ICD-10-CM | POA: Diagnosis not present

## 2019-04-27 DIAGNOSIS — I252 Old myocardial infarction: Secondary | ICD-10-CM | POA: Diagnosis not present

## 2019-04-27 DIAGNOSIS — F419 Anxiety disorder, unspecified: Secondary | ICD-10-CM | POA: Diagnosis not present

## 2019-04-27 DIAGNOSIS — Z6826 Body mass index (BMI) 26.0-26.9, adult: Secondary | ICD-10-CM | POA: Diagnosis not present

## 2019-04-27 DIAGNOSIS — M545 Low back pain: Secondary | ICD-10-CM | POA: Diagnosis not present

## 2019-05-28 DIAGNOSIS — H3561 Retinal hemorrhage, right eye: Secondary | ICD-10-CM | POA: Diagnosis not present

## 2019-05-28 DIAGNOSIS — H353212 Exudative age-related macular degeneration, right eye, with inactive choroidal neovascularization: Secondary | ICD-10-CM | POA: Diagnosis not present

## 2019-05-28 DIAGNOSIS — H353122 Nonexudative age-related macular degeneration, left eye, intermediate dry stage: Secondary | ICD-10-CM | POA: Diagnosis not present

## 2019-05-28 DIAGNOSIS — H353132 Nonexudative age-related macular degeneration, bilateral, intermediate dry stage: Secondary | ICD-10-CM | POA: Diagnosis not present

## 2019-06-27 DIAGNOSIS — Z961 Presence of intraocular lens: Secondary | ICD-10-CM | POA: Diagnosis not present

## 2019-06-27 DIAGNOSIS — H353212 Exudative age-related macular degeneration, right eye, with inactive choroidal neovascularization: Secondary | ICD-10-CM | POA: Diagnosis not present

## 2019-06-27 DIAGNOSIS — H353123 Nonexudative age-related macular degeneration, left eye, advanced atrophic without subfoveal involvement: Secondary | ICD-10-CM | POA: Diagnosis not present

## 2019-06-27 DIAGNOSIS — H04123 Dry eye syndrome of bilateral lacrimal glands: Secondary | ICD-10-CM | POA: Diagnosis not present

## 2019-06-28 ENCOUNTER — Other Ambulatory Visit: Payer: Self-pay

## 2019-06-28 ENCOUNTER — Ambulatory Visit (INDEPENDENT_AMBULATORY_CARE_PROVIDER_SITE_OTHER): Payer: Medicare PPO | Admitting: Internal Medicine

## 2019-06-28 ENCOUNTER — Encounter: Payer: Self-pay | Admitting: Internal Medicine

## 2019-06-28 VITALS — BP 138/72 | HR 67 | Temp 96.9°F | Ht 64.0 in | Wt 124.4 lb

## 2019-06-28 DIAGNOSIS — F5101 Primary insomnia: Secondary | ICD-10-CM

## 2019-06-28 DIAGNOSIS — R296 Repeated falls: Secondary | ICD-10-CM | POA: Diagnosis not present

## 2019-06-28 DIAGNOSIS — H3530 Unspecified macular degeneration: Secondary | ICD-10-CM

## 2019-06-28 DIAGNOSIS — I2510 Atherosclerotic heart disease of native coronary artery without angina pectoris: Secondary | ICD-10-CM | POA: Diagnosis not present

## 2019-06-28 DIAGNOSIS — R42 Dizziness and giddiness: Secondary | ICD-10-CM | POA: Diagnosis not present

## 2019-06-28 DIAGNOSIS — M8000XS Age-related osteoporosis with current pathological fracture, unspecified site, sequela: Secondary | ICD-10-CM | POA: Diagnosis not present

## 2019-06-28 DIAGNOSIS — G8929 Other chronic pain: Secondary | ICD-10-CM | POA: Diagnosis not present

## 2019-06-28 DIAGNOSIS — M545 Low back pain, unspecified: Secondary | ICD-10-CM

## 2019-06-28 DIAGNOSIS — F419 Anxiety disorder, unspecified: Secondary | ICD-10-CM

## 2019-06-28 NOTE — Patient Instructions (Addendum)
COVID-19 Vaccine Information can be found at: [email or web address] For questions related to vaccine distribution or appointments, please email [email or web address] or call [phone].   [facility]) is pleased to announce that COVID-19 vaccinations will be available to Phase 1B adults.   Those in this group can make a vaccination appointment by calling [phone] and selecting Option 2 beginning Friday, January 8 at 8:00 AM. Appointments are required.  [email or web address]  Please bring [place] a copy of your living will and health care power of attorney documentation.    When we get your records, we'll confirm which vaccines you still need.    I'd like you to drink 6 8oz glasses of water per day--try to start early and stop about 2-3 hours before bedtime.

## 2019-06-28 NOTE — Progress Notes (Signed)
Provider:  Dr. Bufford Spikes Location:   [facility]   Place of Service:   [facility]  PCP: Leon Ishihara, MD Patient Care Team: Leon Ishihara, MD as PCP - General (Internal Medicine)  Extended Emergency Contact Information Primary Emergency Contact: Vivien Rossetti [place] Home Phone: [phone] Mobile Phone: [phone] Relation: Son Secondary Emergency Contact: tudor, chandley Mobile Phone: [phone] Relation: Daughter Preferred language: English Interpreter needed? No  Goals of Care: Advanced Directive information Advanced Directives 06/28/2019  Does Patient Have a Medical Advance Directive? Yes  Type of Estate agent of [place];Living will;Out of facility DNR (pink MOST or yellow form)  Copy of Healthcare Power of Attorney in Chart? No - copy requested  Would patient like information on creating a medical advance directive? -      Chief Complaint  Patient presents with  . Establish Care    New patient to establish care.   Fall concerns.  Leon Morris. is with him and his daughter [name] may join.    HPI: Patient is a 86 y.o. male seen today for admission to establish new care.   Son present for visit.   Establishing [facility] care at [facility] because they specialize in geriatrics. Family concerned previous care relied too heavy on prescribing medications. In the past, there have been issues with blood pressure medications which has contributed to falls. Family is interested in care that preserves quality of life but limits unnecessary medications and treatments  Dizziness- At first had migraines with dizziness many years ago. In the past few months, he has become more unsteady and dizzy at times. Family wonders if the dizziness is associated with dehydration. He does not hydrate well. He has also seen a cardiologist and was placed on a holter monitor. Cardiologist cleared him. Previous PCP raised his hydralizine and his  dizziness increased. These episodes have decreased since family reduced his blood pressure medications. They are interested in home health PT/OT to help with gait and balance issues.   Falls- 4 serious falls within last 6 months. Last fall in August 2020 was in a [employer] parking lot . Family suspects he was severly dehydrated and fell flat on his face.  He was taken to the local ED and treated with a  facial injury. Since this injury, he has not driven a car. He is also walking with a cane or front rolling walker at home. Walkways at home are clear.   Cardiac history- he has a extensive cardiac history that includes CAD, hypertension, Mobitz heart block and MI. He is followed by Dr. Ladona Ridgel for sinus node dysfunction which is currently asymptomatic.   Depression- Has been on Celexa for the past few years. Does not think of himself as depressed, but is anxious and has anxiety. He is getting 8-10 hours of sleep a night.  Takes alleve for arthritic pain and lower back pain. Does not take this medication daily, just when he has pain.   Has not gotten covid vaccine. Has had flu vaccine for 2020. Has had shingles vaccine. Tetanus was in 2009.   Drinks 1/2 cup coffee daily. Drinks 1-2 soda/week. Eats three meals a day,the amount of food he is eating is less . Does not use meal supplements often. Does not think his clothes feel loose. Does not weigh himself at home.   Lives with his daughter in a single family home.   Dr. Hazle Quant and Dr. Luciana Axe for macular degeneration.  Was just seen yesterday.  Past Medical History:  Diagnosis Date  . Anxiety   . Arthritis    LOWER BACK PAIN   . CAD (coronary artery disease)   . Dehydration 2020   [facility]   . Depression   . Dizziness   . Hypercholesterolemia   . Hypertension 06/16/2011  . Low blood pressure   . NSTEMI (non-ST elevated myocardial infarction) ([place])    06/16/2011   DR [place]    Past Surgical History:  Procedure Laterality Date  .  CARDIAC CATHETERIZATION    . CATARACT EXTRACTION W/ INTRAOCULAR LENS  IMPLANT, BILATERAL    . CORONARY ANGIOPLASTY     06/2011   DR [place]   . HERNIA REPAIR    . JOINT REPLACEMENT    . KNEE SURGERY    . LEFT HEART CATHETERIZATION WITH CORONARY ANGIOGRAM N/A 06/13/2011   Procedure: LEFT HEART CATHETERIZATION WITH CORONARY ANGIOGRAM;  Surgeon: Peter M [place], MD;  Location: [facility] CATH LAB;  Service: Cardiovascular;  Laterality: N/A;  . [place]    reports that he quit smoking about 78 years ago. He has never used smokeless tobacco. He reports that he does not drink alcohol or use drugs. Social History   Socioeconomic History  . Marital status: Widowed    Spouse name: Not on file  . Number of children: Not on file  . Years of education: Not on file  . Highest education level: Not on file  Occupational History  . Not on file  Tobacco Use  . Smoking status: Former Smoker    Quit date: 05/18/1941    Years since quitting: 78.1  . Smokeless tobacco: Never Used  Substance and Sexual Activity  . Alcohol use: No  . Drug use: No  . Sexual activity: Never  Other Topics Concern  . Not on file  Social History Narrative   Social History      Diet? No       Do you drink/eat things with caffeine? No       Marital status?         Widowed                           What year were you married? 1952      Do you live in a house, apartment, assisted living, condo, trailer, etc.? house      Is it one or more stories? 1      How many persons live in your home? 2      Do you have any pets in your home? (please list) 1 cat       Highest level of education completed? 12      Current or past profession: sales rep      Do you exercise?         yes                             Type & how often? Walk every day and gym when able       Advanced Directives      Do you have a living will? yes      Do you have a DNR form?                                  If not, do you want to  discuss  one? No       Do you have signed POA/HPOA for forms? In process       Functional Status      Do you have difficulty bathing or dressing yourself? No       Do you have difficulty preparing food or eating? No       Do you have difficulty managing your medications? No       Do you have difficulty managing your finances? No       Do you have difficulty affording your medications? No       Social Determinants of Health   Financial Resource Strain:   . Difficulty of Paying Living Expenses: Not on file  Food Insecurity:   . Worried About Programme researcher, broadcasting/film/video in the Last Year: Not on file  . Ran Out of Food in the Last Year: Not on file  Transportation Needs:   . Lack of Transportation (Medical): Not on file  . Lack of Transportation (Non-Medical): Not on file  Physical Activity:   . Days of Exercise per Week: Not on file  . Minutes of Exercise per Session: Not on file  Stress:   . Feeling of Stress : Not on file  Social Connections:   . Frequency of Communication with Friends and Family: Not on file  . Frequency of Social Gatherings with Friends and Family: Not on file  . Attends Religious Services: Not on file  . Active Member of Clubs or Organizations: Not on file  . Attends Banker Meetings: Not on file  . Marital Status: Not on file  Intimate Partner Violence:   . Fear of Current or Ex-Partner: Not on file  . Emotionally Abused: Not on file  . Physically Abused: Not on file  . Sexually Abused: Not on file    Functional Status Survey:    Family History  Problem Relation Age of Onset  . Lung cancer Father   . CAD Father   . CAD Brother   . Heart attack Neg Hx   . Hypertension Neg Hx   . Stroke Neg Hx     Health Maintenance  Topic Date Due  . PNA vac Low Risk Adult (1 of 2 - PCV13) 01/03/1990  . INFLUENZA VACCINE  01/06/2019  . TETANUS/TDAP  08/06/2023    No Known Allergies  Outpatient Encounter Medications as of 06/28/2019  Medication Sig  .  acetaminophen (TYLENOL) 500 MG tablet Take 2 tablets (1,000 mg total) by mouth every 8 (eight) hours.  . citalopram (CELEXA) 20 MG tablet Take 20 mg by mouth daily.   . hydrALAZINE (APRESOLINE) 25 MG tablet Take 25 mg by mouth daily.  . meclizine (ANTIVERT) 12.5 MG tablet   . rosuvastatin (CRESTOR) 5 MG tablet Take 1 tablet (5 mg total) by mouth daily.   Facility-Administered Encounter Medications as of 06/28/2019  Medication  . lactated ringers infusion    Review of Systems  Constitutional: Positive for activity change and appetite change. Negative for fatigue.  HENT: Negative for dental problem, hearing loss and trouble swallowing.   Eyes: Positive for visual disturbance.       Macular degeneration.   Respiratory: Negative for cough and shortness of breath.   Cardiovascular: Negative for chest pain.  Gastrointestinal: Positive for constipation. Negative for abdominal pain, diarrhea and nausea.  Genitourinary: Positive for frequency. Negative for dysuria and hematuria.  Musculoskeletal: Positive for arthralgias.       Ambulates with cane  Skin:       Mole on back of neck  Neurological: Positive for dizziness and light-headedness. Negative for headaches.  Hematological: Bruises/bleeds easily.  Psychiatric/Behavioral: Negative for dysphoric mood and sleep disturbance. The patient is nervous/anxious.     Vitals:   06/28/19 1345  BP: 130/68  Pulse: 67  Temp: (!) 96.9 F (36.1 C)  TempSrc: Temporal  SpO2: 97%  Weight: 124 lb 6.4 oz (56.4 kg)  Height: 5\' 4"  (1.626 m)   Body mass index is 21.35 kg/m. Physical Exam Vitals reviewed.  Constitutional:      General: He is not in acute distress.    Appearance: Normal appearance. He is normal weight.  HENT:     Head: Normocephalic.  Cardiovascular:     Rate and Rhythm: Normal rate and regular rhythm.     Pulses: Normal pulses.     Heart sounds: Normal heart sounds. No murmur.  Pulmonary:     Effort: Pulmonary effort is normal.  No respiratory distress.     Breath sounds: Normal breath sounds. No wheezing.  Abdominal:     General: Abdomen is flat. Bowel sounds are normal. There is no distension.     Palpations: Abdomen is soft.     Tenderness: There is no abdominal tenderness.  Musculoskeletal:     Right lower leg: No edema.     Left lower leg: No edema.  Skin:    General: Skin is warm and dry.     Capillary Refill: Capillary refill takes less than 2 seconds.  Neurological:     General: No focal deficit present.     Mental Status: He is alert and oriented to person, place, and time. Mental status is at baseline.     Sensory: Sensation is intact.     Motor: Weakness present.     Coordination: Coordination is intact.     Gait: Gait is intact.  Psychiatric:        Mood and Affect: Mood normal.        Behavior: Behavior normal.        Thought Content: Thought content normal.        Judgment: Judgment normal.     Labs reviewed: Basic Metabolic Panel: Recent Labs    01/25/19 0932  NA 132*  K 3.5  CL 99  CO2 24  GLUCOSE 136*  BUN 26*  CREATININE 1.83*  CALCIUM 8.3*   Liver Function Tests: Recent Labs    01/25/19 0932  AST 147*  ALT 94*  ALKPHOS 90  BILITOT 1.1  PROT 5.7*  ALBUMIN 3.2*   Recent Labs    01/25/19 0932  LIPASE 21   No results for input(s): AMMONIA in the last 8760 hours. CBC: Recent Labs    01/25/19 0932  WBC 8.1  NEUTROABS 7.1  HGB 11.5*  HCT 34.1*  MCV 95.5  PLT 205   Cardiac Enzymes: No results for input(s): CKTOTAL, CKMB, CKMBINDEX, TROPONINI in the last 8760 hours. BNP: Invalid input(s): POCBNP Lab Results  Component Value Date   HGBA1C 6.0 (H) 06/12/2011   Lab Results  Component Value Date   TSH 2.446 06/12/2011   No results found for: VITAMINB12 No results found for: FOLATE No results found for: IRON, TIBC, FERRITIN  Imaging and Procedures obtained prior to SNF admission: MR BRAIN WO CONTRAST  Result Date: 01/25/2019 CLINICAL DATA:  Vertigo,  episodic, peripheral EXAM: MRI HEAD WITHOUT CONTRAST TECHNIQUE: Multiplanar, multiecho pulse sequences of the brain and surrounding structures were obtained without  intravenous contrast. COMPARISON:  Head CT and maxillofacial CT 09/24/2018 FINDINGS: Brain: No evidence of acute infarct. No evidence of intracranial mass. No chronic intracranial hemorrhage. No midline shift or extra-axial collection. Moderate scattered and confluent T2/FLAIR hyperintensity within the cerebral white matter is nonspecific, but consistent with chronic small vessel ischemic disease. Moderate generalized parenchymal atrophy. Vascular: Flow voids maintained within the proximal large vessels. The distal right vertebral artery is non dominant. Skull and upper cervical spine: Normal marrow signal. Reversal of the visualized cervical lordosis. Incompletely assessed cervical spondylosis with multilevel posterior disc osteophyte complexes. C3-C4 grade 1 retrolisthesis. Sinuses/Orbits: The imaged globes and orbits demonstrate no acute abnormality. Mild scattered paranasal sinus mucosal thickening. No significant mastoid effusion. IMPRESSION: No evidence of acute intracranial abnormality. Moderate generalized parenchymal atrophy and chronic small vessel ischemic disease. Incompletely assessed cervical spondylosis with multilevel posterior disc osteophytes. Electronically Signed   By: Jackey LogeKyle  Golden   On: 01/25/2019 11:45    Assessment/Plan 1. Dizziness - orthostatic blood pressure- today - no significant drop in blood pressure with position changes - he remains at a high risk for falling due to previous incidents within last 6 months - referral for home health PT - complete blood panel with differential/platelets- future  2. Falls frequently - he is at a high risk for falls  - referral for home health PT - recommend using a cane at all times, use front wheeled walker if he becomes more unsteady  3, Age-related osteoporosis with current  pathological fracture, sequela - see above - vitamin D 25 hydroxy level- future  4. Coronary artery disease involving native coronary artery of native heart without angina pectoris - followed by Dr. Ladona Ridgelaylor and Dr. Allyson SabalBerry - lipid panel- future  5. Macular degeneration, unspecified laterality, unspecified type - followed by Dr. Hazle Quantigby and [place] - eyes are injected every 6 months - stable at this time, no recent changes in vision  6. Chronic midline low back pain without sciatica - stable at this time, no reports of progression - continue to use tylenol PRN for pain - limit use of alleve for pain  7. Primary insomnia - sleeping patterns are not stable at times - do not recommend use of NSAIDS to help sleep - recommend calm music and reading prior to bedtime  8. Anxiety - currently on celexa with no progression of anxiety and no panic attacks  - continue celexa 20 mg daily     Family/ staff Communication:   Labs/tests ordered: cbc with differential/platelets, complete metabolic panel with GFR, TSH, lipid panel, vitamin D 25 hydroxy- future  Follow up: 2 months

## 2019-07-04 DIAGNOSIS — M47816 Spondylosis without myelopathy or radiculopathy, lumbar region: Secondary | ICD-10-CM | POA: Diagnosis not present

## 2019-07-04 DIAGNOSIS — H3530 Unspecified macular degeneration: Secondary | ICD-10-CM | POA: Diagnosis not present

## 2019-07-04 DIAGNOSIS — R296 Repeated falls: Secondary | ICD-10-CM | POA: Diagnosis not present

## 2019-07-04 DIAGNOSIS — I2510 Atherosclerotic heart disease of native coronary artery without angina pectoris: Secondary | ICD-10-CM | POA: Diagnosis not present

## 2019-07-04 DIAGNOSIS — I10 Essential (primary) hypertension: Secondary | ICD-10-CM | POA: Diagnosis not present

## 2019-07-04 DIAGNOSIS — M810 Age-related osteoporosis without current pathological fracture: Secondary | ICD-10-CM | POA: Diagnosis not present

## 2019-07-04 DIAGNOSIS — N401 Enlarged prostate with lower urinary tract symptoms: Secondary | ICD-10-CM | POA: Diagnosis not present

## 2019-07-04 DIAGNOSIS — G8929 Other chronic pain: Secondary | ICD-10-CM | POA: Diagnosis not present

## 2019-07-04 DIAGNOSIS — R42 Dizziness and giddiness: Secondary | ICD-10-CM | POA: Diagnosis not present

## 2019-07-11 DIAGNOSIS — I10 Essential (primary) hypertension: Secondary | ICD-10-CM | POA: Diagnosis not present

## 2019-07-11 DIAGNOSIS — I2510 Atherosclerotic heart disease of native coronary artery without angina pectoris: Secondary | ICD-10-CM | POA: Diagnosis not present

## 2019-07-11 DIAGNOSIS — M810 Age-related osteoporosis without current pathological fracture: Secondary | ICD-10-CM | POA: Diagnosis not present

## 2019-07-11 DIAGNOSIS — G8929 Other chronic pain: Secondary | ICD-10-CM | POA: Diagnosis not present

## 2019-07-11 DIAGNOSIS — N401 Enlarged prostate with lower urinary tract symptoms: Secondary | ICD-10-CM | POA: Diagnosis not present

## 2019-07-11 DIAGNOSIS — R296 Repeated falls: Secondary | ICD-10-CM | POA: Diagnosis not present

## 2019-07-11 DIAGNOSIS — R42 Dizziness and giddiness: Secondary | ICD-10-CM | POA: Diagnosis not present

## 2019-07-11 DIAGNOSIS — M47816 Spondylosis without myelopathy or radiculopathy, lumbar region: Secondary | ICD-10-CM | POA: Diagnosis not present

## 2019-07-11 DIAGNOSIS — H3530 Unspecified macular degeneration: Secondary | ICD-10-CM | POA: Diagnosis not present

## 2019-07-18 DIAGNOSIS — R42 Dizziness and giddiness: Secondary | ICD-10-CM | POA: Diagnosis not present

## 2019-07-18 DIAGNOSIS — I10 Essential (primary) hypertension: Secondary | ICD-10-CM | POA: Diagnosis not present

## 2019-07-18 DIAGNOSIS — H3530 Unspecified macular degeneration: Secondary | ICD-10-CM | POA: Diagnosis not present

## 2019-07-18 DIAGNOSIS — M810 Age-related osteoporosis without current pathological fracture: Secondary | ICD-10-CM | POA: Diagnosis not present

## 2019-07-18 DIAGNOSIS — R296 Repeated falls: Secondary | ICD-10-CM | POA: Diagnosis not present

## 2019-07-18 DIAGNOSIS — M47816 Spondylosis without myelopathy or radiculopathy, lumbar region: Secondary | ICD-10-CM | POA: Diagnosis not present

## 2019-07-18 DIAGNOSIS — I2510 Atherosclerotic heart disease of native coronary artery without angina pectoris: Secondary | ICD-10-CM | POA: Diagnosis not present

## 2019-07-18 DIAGNOSIS — N401 Enlarged prostate with lower urinary tract symptoms: Secondary | ICD-10-CM | POA: Diagnosis not present

## 2019-07-18 DIAGNOSIS — G8929 Other chronic pain: Secondary | ICD-10-CM | POA: Diagnosis not present

## 2019-07-25 DIAGNOSIS — M810 Age-related osteoporosis without current pathological fracture: Secondary | ICD-10-CM | POA: Diagnosis not present

## 2019-07-25 DIAGNOSIS — H3530 Unspecified macular degeneration: Secondary | ICD-10-CM | POA: Diagnosis not present

## 2019-07-25 DIAGNOSIS — R296 Repeated falls: Secondary | ICD-10-CM | POA: Diagnosis not present

## 2019-07-25 DIAGNOSIS — I10 Essential (primary) hypertension: Secondary | ICD-10-CM | POA: Diagnosis not present

## 2019-07-25 DIAGNOSIS — I2510 Atherosclerotic heart disease of native coronary artery without angina pectoris: Secondary | ICD-10-CM | POA: Diagnosis not present

## 2019-07-25 DIAGNOSIS — R42 Dizziness and giddiness: Secondary | ICD-10-CM | POA: Diagnosis not present

## 2019-07-25 DIAGNOSIS — G8929 Other chronic pain: Secondary | ICD-10-CM | POA: Diagnosis not present

## 2019-07-25 DIAGNOSIS — M47816 Spondylosis without myelopathy or radiculopathy, lumbar region: Secondary | ICD-10-CM | POA: Diagnosis not present

## 2019-07-25 DIAGNOSIS — N401 Enlarged prostate with lower urinary tract symptoms: Secondary | ICD-10-CM | POA: Diagnosis not present

## 2019-08-01 DIAGNOSIS — N401 Enlarged prostate with lower urinary tract symptoms: Secondary | ICD-10-CM | POA: Diagnosis not present

## 2019-08-01 DIAGNOSIS — R296 Repeated falls: Secondary | ICD-10-CM | POA: Diagnosis not present

## 2019-08-01 DIAGNOSIS — R42 Dizziness and giddiness: Secondary | ICD-10-CM | POA: Diagnosis not present

## 2019-08-01 DIAGNOSIS — H3530 Unspecified macular degeneration: Secondary | ICD-10-CM | POA: Diagnosis not present

## 2019-08-01 DIAGNOSIS — G8929 Other chronic pain: Secondary | ICD-10-CM | POA: Diagnosis not present

## 2019-08-01 DIAGNOSIS — I10 Essential (primary) hypertension: Secondary | ICD-10-CM | POA: Diagnosis not present

## 2019-08-01 DIAGNOSIS — M810 Age-related osteoporosis without current pathological fracture: Secondary | ICD-10-CM | POA: Diagnosis not present

## 2019-08-01 DIAGNOSIS — M47816 Spondylosis without myelopathy or radiculopathy, lumbar region: Secondary | ICD-10-CM | POA: Diagnosis not present

## 2019-08-01 DIAGNOSIS — I2510 Atherosclerotic heart disease of native coronary artery without angina pectoris: Secondary | ICD-10-CM | POA: Diagnosis not present

## 2019-08-09 DIAGNOSIS — R42 Dizziness and giddiness: Secondary | ICD-10-CM | POA: Diagnosis not present

## 2019-08-09 DIAGNOSIS — I10 Essential (primary) hypertension: Secondary | ICD-10-CM | POA: Diagnosis not present

## 2019-08-09 DIAGNOSIS — I2510 Atherosclerotic heart disease of native coronary artery without angina pectoris: Secondary | ICD-10-CM | POA: Diagnosis not present

## 2019-08-09 DIAGNOSIS — R296 Repeated falls: Secondary | ICD-10-CM | POA: Diagnosis not present

## 2019-08-09 DIAGNOSIS — M810 Age-related osteoporosis without current pathological fracture: Secondary | ICD-10-CM | POA: Diagnosis not present

## 2019-08-09 DIAGNOSIS — M47816 Spondylosis without myelopathy or radiculopathy, lumbar region: Secondary | ICD-10-CM | POA: Diagnosis not present

## 2019-08-09 DIAGNOSIS — G8929 Other chronic pain: Secondary | ICD-10-CM | POA: Diagnosis not present

## 2019-08-09 DIAGNOSIS — H3530 Unspecified macular degeneration: Secondary | ICD-10-CM | POA: Diagnosis not present

## 2019-08-09 DIAGNOSIS — N401 Enlarged prostate with lower urinary tract symptoms: Secondary | ICD-10-CM | POA: Diagnosis not present

## 2019-08-15 DIAGNOSIS — M810 Age-related osteoporosis without current pathological fracture: Secondary | ICD-10-CM | POA: Diagnosis not present

## 2019-08-15 DIAGNOSIS — M47816 Spondylosis without myelopathy or radiculopathy, lumbar region: Secondary | ICD-10-CM | POA: Diagnosis not present

## 2019-08-15 DIAGNOSIS — I2510 Atherosclerotic heart disease of native coronary artery without angina pectoris: Secondary | ICD-10-CM | POA: Diagnosis not present

## 2019-08-15 DIAGNOSIS — G8929 Other chronic pain: Secondary | ICD-10-CM | POA: Diagnosis not present

## 2019-08-15 DIAGNOSIS — H3530 Unspecified macular degeneration: Secondary | ICD-10-CM | POA: Diagnosis not present

## 2019-08-15 DIAGNOSIS — R296 Repeated falls: Secondary | ICD-10-CM | POA: Diagnosis not present

## 2019-08-15 DIAGNOSIS — R42 Dizziness and giddiness: Secondary | ICD-10-CM | POA: Diagnosis not present

## 2019-08-15 DIAGNOSIS — I10 Essential (primary) hypertension: Secondary | ICD-10-CM | POA: Diagnosis not present

## 2019-08-15 DIAGNOSIS — N401 Enlarged prostate with lower urinary tract symptoms: Secondary | ICD-10-CM | POA: Diagnosis not present

## 2019-08-21 DIAGNOSIS — G8929 Other chronic pain: Secondary | ICD-10-CM | POA: Diagnosis not present

## 2019-08-21 DIAGNOSIS — N401 Enlarged prostate with lower urinary tract symptoms: Secondary | ICD-10-CM | POA: Diagnosis not present

## 2019-08-21 DIAGNOSIS — R296 Repeated falls: Secondary | ICD-10-CM | POA: Diagnosis not present

## 2019-08-21 DIAGNOSIS — M810 Age-related osteoporosis without current pathological fracture: Secondary | ICD-10-CM | POA: Diagnosis not present

## 2019-08-21 DIAGNOSIS — H3530 Unspecified macular degeneration: Secondary | ICD-10-CM | POA: Diagnosis not present

## 2019-08-21 DIAGNOSIS — M47816 Spondylosis without myelopathy or radiculopathy, lumbar region: Secondary | ICD-10-CM | POA: Diagnosis not present

## 2019-08-21 DIAGNOSIS — I10 Essential (primary) hypertension: Secondary | ICD-10-CM | POA: Diagnosis not present

## 2019-08-21 DIAGNOSIS — R42 Dizziness and giddiness: Secondary | ICD-10-CM | POA: Diagnosis not present

## 2019-08-21 DIAGNOSIS — I2510 Atherosclerotic heart disease of native coronary artery without angina pectoris: Secondary | ICD-10-CM | POA: Diagnosis not present

## 2019-08-27 ENCOUNTER — Other Ambulatory Visit: Payer: Medicare PPO

## 2019-08-27 ENCOUNTER — Other Ambulatory Visit: Payer: Self-pay

## 2019-08-27 DIAGNOSIS — I2510 Atherosclerotic heart disease of native coronary artery without angina pectoris: Secondary | ICD-10-CM

## 2019-08-27 DIAGNOSIS — M8000XS Age-related osteoporosis with current pathological fracture, unspecified site, sequela: Secondary | ICD-10-CM | POA: Diagnosis not present

## 2019-08-27 DIAGNOSIS — R42 Dizziness and giddiness: Secondary | ICD-10-CM

## 2019-08-27 LAB — COMPLETE METABOLIC PANEL WITH GFR
AG Ratio: 1.7 (calc) (ref 1.0–2.5)
ALT: 11 U/L (ref 9–46)
AST: 18 U/L (ref 10–35)
Albumin: 4 g/dL (ref 3.6–5.1)
Alkaline phosphatase (APISO): 76 U/L (ref 35–144)
BUN/Creatinine Ratio: 21 (calc) (ref 6–22)
BUN: 38 mg/dL — ABNORMAL HIGH (ref 7–25)
CO2: 27 mmol/L (ref 20–32)
Calcium: 9 mg/dL (ref 8.6–10.3)
Chloride: 103 mmol/L (ref 98–110)
Creat: 1.85 mg/dL — ABNORMAL HIGH (ref 0.70–1.11)
GFR, Est African American: 35 mL/min/{1.73_m2} — ABNORMAL LOW (ref >=60)
GFR, Est Non African American: 30 mL/min/{1.73_m2} — ABNORMAL LOW (ref >=60)
Globulin: 2.4 g/dL (calc) (ref 1.9–3.7)
Glucose, Bld: 91 mg/dL (ref 65–99)
Potassium: 4.9 mmol/L (ref 3.5–5.3)
Sodium: 138 mmol/L (ref 135–146)
Total Bilirubin: 0.7 mg/dL (ref 0.2–1.2)
Total Protein: 6.4 g/dL (ref 6.1–8.1)

## 2019-08-27 LAB — CBC WITH DIFFERENTIAL/PLATELET
Absolute Monocytes: 810 cells/uL (ref 200–950)
Basophils Absolute: 18 cells/uL (ref 0–200)
Basophils Relative: 0.2 %
Eosinophils Absolute: 150 cells/uL (ref 15–500)
Eosinophils Relative: 1.7 %
HCT: 33.2 % — ABNORMAL LOW (ref 38.5–50.0)
Hemoglobin: 11 g/dL — ABNORMAL LOW (ref 13.2–17.1)
Lymphs Abs: 1654 cells/uL (ref 850–3900)
MCH: 32.2 pg (ref 27.0–33.0)
MCHC: 33.1 g/dL (ref 32.0–36.0)
MCV: 97.1 fL (ref 80.0–100.0)
MPV: 9.9 fL (ref 7.5–12.5)
Monocytes Relative: 9.2 %
Neutro Abs: 6169 cells/uL (ref 1500–7800)
Neutrophils Relative %: 70.1 %
Platelets: 305 10*3/uL (ref 140–400)
RBC: 3.42 10*6/uL — ABNORMAL LOW (ref 4.20–5.80)
RDW: 12.4 % (ref 11.0–15.0)
Total Lymphocyte: 18.8 %
WBC: 8.8 10*3/uL (ref 3.8–10.8)

## 2019-08-27 LAB — LIPID PANEL
Cholesterol: 105 mg/dL (ref <200)
HDL: 37 mg/dL — ABNORMAL LOW (ref >=40)
LDL Cholesterol (Calc): 53 mg/dL (calc)
Non-HDL Cholesterol (Calc): 68 mg/dL (calc) (ref <130)
Total CHOL/HDL Ratio: 2.8 (calc) (ref <5.0)
Triglycerides: 74 mg/dL (ref <150)

## 2019-08-27 LAB — VITAMIN D 25 HYDROXY (VIT D DEFICIENCY, FRACTURES): Vit D, 25-Hydroxy: 90 ng/mL (ref 30–100)

## 2019-08-27 LAB — TSH: TSH: 4.34 mIU/L (ref 0.40–4.50)

## 2019-08-30 ENCOUNTER — Ambulatory Visit: Payer: Medicare PPO | Admitting: Internal Medicine

## 2019-08-31 DIAGNOSIS — M810 Age-related osteoporosis without current pathological fracture: Secondary | ICD-10-CM | POA: Diagnosis not present

## 2019-08-31 DIAGNOSIS — I10 Essential (primary) hypertension: Secondary | ICD-10-CM | POA: Diagnosis not present

## 2019-08-31 DIAGNOSIS — R42 Dizziness and giddiness: Secondary | ICD-10-CM | POA: Diagnosis not present

## 2019-08-31 DIAGNOSIS — N401 Enlarged prostate with lower urinary tract symptoms: Secondary | ICD-10-CM | POA: Diagnosis not present

## 2019-08-31 DIAGNOSIS — I2510 Atherosclerotic heart disease of native coronary artery without angina pectoris: Secondary | ICD-10-CM | POA: Diagnosis not present

## 2019-08-31 DIAGNOSIS — R296 Repeated falls: Secondary | ICD-10-CM | POA: Diagnosis not present

## 2019-08-31 DIAGNOSIS — M47816 Spondylosis without myelopathy or radiculopathy, lumbar region: Secondary | ICD-10-CM | POA: Diagnosis not present

## 2019-08-31 DIAGNOSIS — G8929 Other chronic pain: Secondary | ICD-10-CM | POA: Diagnosis not present

## 2019-08-31 DIAGNOSIS — H3530 Unspecified macular degeneration: Secondary | ICD-10-CM | POA: Diagnosis not present

## 2019-09-06 ENCOUNTER — Encounter: Payer: Self-pay | Admitting: Internal Medicine

## 2019-09-06 ENCOUNTER — Other Ambulatory Visit: Payer: Self-pay

## 2019-09-06 ENCOUNTER — Ambulatory Visit (INDEPENDENT_AMBULATORY_CARE_PROVIDER_SITE_OTHER): Payer: Medicare PPO | Admitting: Internal Medicine

## 2019-09-06 VITALS — BP 122/72 | HR 67 | Temp 97.8°F | Ht 64.0 in | Wt 123.2 lb

## 2019-09-06 DIAGNOSIS — E7800 Pure hypercholesterolemia, unspecified: Secondary | ICD-10-CM | POA: Insufficient documentation

## 2019-09-06 DIAGNOSIS — R42 Dizziness and giddiness: Secondary | ICD-10-CM | POA: Diagnosis not present

## 2019-09-06 DIAGNOSIS — M8000XS Age-related osteoporosis with current pathological fracture, unspecified site, sequela: Secondary | ICD-10-CM

## 2019-09-06 DIAGNOSIS — I2510 Atherosclerotic heart disease of native coronary artery without angina pectoris: Secondary | ICD-10-CM

## 2019-09-06 DIAGNOSIS — H3530 Unspecified macular degeneration: Secondary | ICD-10-CM | POA: Insufficient documentation

## 2019-09-06 DIAGNOSIS — F419 Anxiety disorder, unspecified: Secondary | ICD-10-CM | POA: Diagnosis not present

## 2019-09-06 DIAGNOSIS — M8000XA Age-related osteoporosis with current pathological fracture, unspecified site, initial encounter for fracture: Secondary | ICD-10-CM | POA: Insufficient documentation

## 2019-09-06 MED ORDER — CITALOPRAM HYDROBROMIDE 40 MG PO TABS: 40.0000 mg | ORAL_TABLET | Freq: Every day | ORAL | 3 refills | Status: DC

## 2019-09-06 NOTE — Progress Notes (Signed)
Location:  [facility] clinic Provider:  Alif Petrak L. Renato Gails, D.O., C.M.D.  Code Status: began discussion about code status today and suggested that he talk with his family about this more b/w now and next visit  Goals of Care:  Advanced Directives 09/06/2019  Does Patient Have a Medical Advance Directive? Yes  Type of Advance Directive Healthcare Power of Attorney  Does patient want to make changes to medical advance directive? No - Patient declined  Copy of Healthcare Power of Attorney in Chart? -  Would patient like information on creating a medical advance directive? -   Chief Complaint  Patient presents with  . Medical Management of Chronic Issues    2 month follow up on dizziness     HPI: Patient is a 86 y.o. male seen today for medical management of chronic diseases.    He will still be dizzy every now and then, but 1.5/10 on scale.  Unsteady walking remains.   He's been seeing PT and finished last week.  He does feel like it was a big help and he enjoys doing exercise.  No changes were needed in the home for safety.  It is one story.    He has an appt with Dr. Luciana Axe coming up.  He did see Dr. Hazle Quant for new glasses which has helped if he's wear them.  Says he only will put them on when he needs them.  Says he can see me and his daughter.    His daughter is pushing fluids on him, but he is not good about drinking water--she says he's stubborn.    He takes vitamin D daily and weekly due to low levels in the past.  It was 90 now.    Discussed using tylenol instead of aleve for pain.  Does not take often (1-2times per week).    Takes crestor 5mg  daily and cholesterol is at goal.    Past Medical History:  Diagnosis Date  . Anxiety   . Arthritis    LOWER BACK PAIN   . CAD (coronary artery disease)   . Dehydration 2020   [facility]   . Depression   . Dizziness   . Hypercholesterolemia   . Hypertension 06/16/2011  . Low blood pressure   . NSTEMI (non-ST elevated myocardial  infarction) ([facility])    06/16/2011   DR 08/14/2011     Past Surgical History:  Procedure Laterality Date  . CARDIAC CATHETERIZATION    . CATARACT EXTRACTION W/ INTRAOCULAR LENS  IMPLANT, BILATERAL    . CORONARY ANGIOPLASTY     06/2011   DR 07/2011   . HERNIA REPAIR    . JOINT REPLACEMENT    . KNEE SURGERY    . LEFT HEART CATHETERIZATION WITH CORONARY ANGIOGRAM N/A 06/13/2011   Procedure: LEFT HEART CATHETERIZATION WITH CORONARY ANGIOGRAM;  Surgeon: Peter M 08/11/2011, MD;  Location: [facility] CATH LAB;  Service: Cardiovascular;  Laterality: N/A;  . SHOULDER SURGERY  1997    No Known Allergies  Outpatient Encounter Medications as of 09/06/2019  Medication Sig  . acetaminophen (TYLENOL) 500 MG tablet Take 2 tablets (1,000 mg total) by mouth every 8 (eight) hours.  . citalopram (CELEXA) 20 MG tablet Take 20 mg by mouth daily.   . hydrALAZINE (APRESOLINE) 25 MG tablet Take 25 mg by mouth daily.  . meclizine (ANTIVERT) 12.5 MG tablet   . rosuvastatin (CRESTOR) 5 MG tablet Take 1 tablet (5 mg total) by mouth daily.   No facility-administered encounter medications on  file as of 09/06/2019.    Review of Systems:  Review of Systems  Constitutional: Negative for chills, fever and malaise/fatigue.  HENT: Negative for congestion and sore throat.   Eyes:       Macular degeneration, not wearing glasses as directed either  Respiratory: Negative for shortness of breath.   Cardiovascular: Negative for chest pain, palpitations and leg swelling.  Gastrointestinal: Negative for abdominal pain and constipation.  Genitourinary: Negative for dysuria.  Musculoskeletal: Negative for falls and joint pain.  Skin: Negative for itching and rash.  Neurological: Positive for dizziness. Negative for loss of consciousness.  Endo/Heme/Allergies: Bruises/bleeds easily.  Psychiatric/Behavioral: Positive for memory loss. Negative for depression. The patient is nervous/anxious. The patient does not have insomnia.     Health  Maintenance  Topic Date Due  . PNA vac Low Risk Adult (1 of 2 - PCV13) Never done  . INFLUENZA VACCINE  01/06/2020  . TETANUS/TDAP  08/06/2023    Physical Exam: Vitals:   09/06/19 1106  BP: 122/72  Pulse: 67  Temp: 97.8 F (36.6 C)  TempSrc: Temporal  SpO2: 99%  Weight: 123 lb 3.2 oz (55.9 kg)  Height: 5\' 4"  (1.626 m)   Body mass index is 21.15 kg/m. Physical Exam Vitals reviewed.  Constitutional:      General: He is not in acute distress.    Appearance: He is not ill-appearing or toxic-appearing.  HENT:     Head: Normocephalic and atraumatic.  Eyes:     Extraocular Movements: Extraocular movements intact.     Pupils: Pupils are equal, round, and reactive to light.  Cardiovascular:     Rate and Rhythm: Normal rate and regular rhythm.     Pulses: Normal pulses.     Heart sounds: Murmur present.  Pulmonary:     Effort: Pulmonary effort is normal.     Breath sounds: Normal breath sounds.  Abdominal:     General: Bowel sounds are normal.  Musculoskeletal:        General: Normal range of motion.     Right lower leg: No edema.     Left lower leg: No edema.  Skin:    General: Skin is warm and dry.     Comments: suntanned  Neurological:     General: No focal deficit present.     Mental Status: He is alert and oriented to person, place, and time.     Cranial Nerves: No cranial nerve deficit.  Psychiatric:        Mood and Affect: Mood normal.        Behavior: Behavior normal.     Labs reviewed: Basic Metabolic Panel: Recent Labs    01/25/19 0932 08/27/19 0958  NA 132* 138  K 3.5 4.9  CL 99 103  CO2 24 27  GLUCOSE 136* 91  BUN 26* 38*  CREATININE 1.83* 1.85*  CALCIUM 8.3* 9.0  TSH  --  4.34   Liver Function Tests: Recent Labs    01/25/19 0932 08/27/19 0958  AST 147* 18  ALT 94* 11  ALKPHOS 90  --   BILITOT 1.1 0.7  PROT 5.7* 6.4  ALBUMIN 3.2*  --    Recent Labs    01/25/19 0932  LIPASE 21   No results for input(s): AMMONIA in the last  8760 hours. CBC: Recent Labs    01/25/19 0932 08/27/19 0958  WBC 8.1 8.8  NEUTROABS 7.1 6,169  HGB 11.5* 11.0*  HCT 34.1* 33.2*  MCV 95.5 97.1  PLT 205  305   Lipid Panel: Recent Labs    08/27/19 0958  CHOL 105  HDL 37*  LDLCALC 53  TRIG 74  CHOLHDL 2.8   Lab Results  Component Value Date   HGBA1C 6.0 (H) 06/12/2011    Assessment/Plan 1. Anxiety - his daughter reports he is still having considerable anxiety at times and they wanted to try an increase in his celexa - citalopram (CELEXA) 40 MG tablet; Take 1 tablet (40 mg total) by mouth daily.  Dispense: 30 tablet; Refill: 3  2. Age-related osteoporosis with current pathological fracture, sequela -they report he's taking vitamin D supplements but we don't have the doses on his chart -I will ask CMA to call and verify dosing with pt or daughter and ask to bring all meds to next appt  3. Coronary artery disease involving native coronary artery of native heart without angina pectoris -bp controlled with just 25mg  hydralazine and has crestor for cholesterol, not on asa or plavix   4. Macular degeneration, unspecified laterality, unspecified type -should be on preservision areds2, but also not on med list -is not wearing glasses and needs them per his daughter   5. Dizziness -better than last time, maintain hydration and slow positional changes -may need f/u echo--only prior finding of significance was moderate pulmonary htn (mild AS,  Mild MR, grade 1 diastolic dysfunction and hyperdynamic EF)  6. Pure hypercholesterolemia -LDL at goal  -continues on crestor and does not report any tolerability issues  Labs/tests ordered:   Lab Orders  No laboratory test(s) ordered today   Next appt:  6 mos for med mgt and MMSE  Bess Saltzman L. Miarose Lippert, D.O. [facility] [address], [place] Cell Phone (Mon-Fri 8am-5pm):  [phone] On Call:  [phone] & follow  prompts after 5pm & weekends Office Phone:  [phone] Office Fax:  [phone]

## 2019-09-06 NOTE — Patient Instructions (Addendum)
Try to drink 6 8oz glasses of water per day.  This will help prevent dizziness and improve your kidney function.

## 2019-10-09 ENCOUNTER — Other Ambulatory Visit: Payer: Self-pay | Admitting: Internal Medicine

## 2019-10-09 DIAGNOSIS — F419 Anxiety disorder, unspecified: Secondary | ICD-10-CM

## 2019-11-24 ENCOUNTER — Other Ambulatory Visit: Payer: Self-pay | Admitting: Internal Medicine

## 2019-11-24 DIAGNOSIS — F419 Anxiety disorder, unspecified: Secondary | ICD-10-CM

## 2019-11-26 ENCOUNTER — Other Ambulatory Visit: Payer: Self-pay

## 2019-11-26 ENCOUNTER — Encounter (INDEPENDENT_AMBULATORY_CARE_PROVIDER_SITE_OTHER): Payer: Self-pay | Admitting: Ophthalmology

## 2019-11-26 ENCOUNTER — Ambulatory Visit (INDEPENDENT_AMBULATORY_CARE_PROVIDER_SITE_OTHER): Payer: Medicare PPO | Admitting: Ophthalmology

## 2019-11-26 DIAGNOSIS — H353212 Exudative age-related macular degeneration, right eye, with inactive choroidal neovascularization: Secondary | ICD-10-CM

## 2019-11-26 DIAGNOSIS — H353122 Nonexudative age-related macular degeneration, left eye, intermediate dry stage: Secondary | ICD-10-CM

## 2019-11-26 DIAGNOSIS — H353132 Nonexudative age-related macular degeneration, bilateral, intermediate dry stage: Secondary | ICD-10-CM

## 2019-11-26 DIAGNOSIS — H3561 Retinal hemorrhage, right eye: Secondary | ICD-10-CM | POA: Diagnosis not present

## 2019-11-26 NOTE — Telephone Encounter (Signed)
rx sent to pharmacy by e-script  

## 2019-11-26 NOTE — Progress Notes (Signed)
11/26/2019     CHIEF COMPLAINT Patient presents for Retina Follow Up   HISTORY OF PRESENT ILLNESS: Leon Morris is a 86 y.o. male who presents to the clinic today for:   HPI    Retina Follow Up    Patient presents with  Dry AMD.  In both eyes.  This started 6 months ago.  Severity is mild.  Duration of 6 months.  Since onset it is stable.          Comments    6 Month AMD F/U OU  Pt denies noticeable changes to [place] OU since last visit. Pt denies ocular pain, flashes of light, or floaters OU.         Last edited by Ileana Roup, COA on 11/26/2019 10:30 AM. (History)      Referring physician: Kermit Balo, DO [address] [place],  [place] [address]  HISTORICAL INFORMATION:   Selected notes from the MEDICAL RECORD NUMBER    Lab Results  Component Value Date   HGBA1C 6.0 (H) 06/12/2011     CURRENT MEDICATIONS: No current outpatient medications on file. (Ophthalmic Drugs)   No current facility-administered medications for this visit. (Ophthalmic Drugs)   Current Outpatient Medications (Other)  Medication Sig   acetaminophen (TYLENOL) 500 MG tablet Take 2 tablets (1,000 mg total) by mouth every 8 (eight) hours.   citalopram (CELEXA) 40 MG tablet TAKE 1 TABLET (40 MG TOTAL) BY MOUTH DAILY   hydrALAZINE (APRESOLINE) 25 MG tablet Take 25 mg by mouth daily.   meclizine (ANTIVERT) 12.5 MG tablet    rosuvastatin (CRESTOR) 5 MG tablet Take 1 tablet (5 mg total) by mouth daily.   No current facility-administered medications for this visit. (Other)      REVIEW OF SYSTEMS:    ALLERGIES No Known Allergies  PAST MEDICAL HISTORY Past Medical History:  Diagnosis Date   Anxiety    Arthritis    LOWER BACK PAIN    CAD (coronary artery disease)    Dehydration 2020   [facility]    Depression    Dizziness    Hypercholesterolemia    Hypertension 06/16/2011   Low blood pressure    NSTEMI (non-ST elevated myocardial infarction) (HCC)     06/16/2011   [facility] [place]    Past Surgical History:  Procedure Laterality Date   CARDIAC CATHETERIZATION     CATARACT EXTRACTION W/ INTRAOCULAR LENS  IMPLANT, BILATERAL     CORONARY ANGIOPLASTY     06/2011   [facility] [place]    HERNIA REPAIR     JOINT REPLACEMENT     KNEE SURGERY     LEFT HEART CATHETERIZATION WITH CORONARY ANGIOGRAM N/A 06/13/2011   Procedure: LEFT HEART CATHETERIZATION WITH CORONARY ANGIOGRAM;  Surgeon: Peter M [place], MD;  Location: [facility] LAB;  Service: Cardiovascular;  Laterality: N/A;   SHOULDER SURGERY  1997    FAMILY HISTORY Family History  Problem Relation Age of Onset   Lung cancer Father    CAD Father    CAD Brother    Heart attack Neg Hx    Hypertension Neg Hx    Stroke Neg Hx     SOCIAL HISTORY Social History   Tobacco Use   Smoking status: Former Smoker    Quit date: 05/18/1941    Years since quitting: 78.5   Smokeless tobacco: Never Used  Vaping Use   Vaping Use: Never used  Substance Use Topics   Alcohol use: No   Drug  use: No         OPHTHALMIC EXAM:  Base Eye Exam    Visual Acuity (ETDRS)      Right Left   Dist cc 20/200 +1 20/50   Dist ph cc NI -2 NI   Correction: Glasses       Tonometry (Tonopen, 10:35 AM)      Right Left   Pressure 13 10       Pupils      Pupils Dark Light Shape React APD   Right PERRL 3 2 Round Brisk None   Left PERRL 3 2 Round Brisk None       Visual Fields (Counting fingers)      Left Right    Full Full       Extraocular Movement      Right Left    Full Full       Neuro/Psych    Oriented x3: Yes   Mood/Affect: Normal       Dilation    Both eyes: 1.0% Mydriacyl, 2.5% Phenylephrine @ 10:35 AM        Slit Lamp and Fundus Exam    External Exam      Right Left   External Normal Normal       Slit Lamp Exam      Right Left   Lids/Lashes Normal Normal   Conjunctiva/Sclera White and quiet White and quiet   Cornea Clear Clear   Anterior Chamber Deep and quiet Deep and  quiet   Iris Round and reactive Round and reactive   Lens Posterior chamber intraocular lens, Centered posterior chamber intraocular lens Posterior chamber intraocular lens, Centered posterior chamber intraocular lens   Anterior Vitreous Normal Normal       Fundus Exam      Right Left   Posterior Vitreous Normal Normal   Disc Normal Normal   C/D Ratio 0.45 0.5   Macula Geographic atrophy,  , no hemorrhage, no exudates Geographic atrophy,  , no hemorrhage, no exudates   Vessels Normal Normal   Periphery Normal Normal          IMAGING AND PROCEDURES  Imaging and Procedures for 11/26/19  OCT, Retina - OU - Both Eyes       Right Eye Quality was good. Scan locations included subfoveal. Central Foveal Thickness: 233. Findings include no SRF, retinal drusen , outer retinal atrophy, no IRF.   Left Eye Quality was good. Scan locations included subfoveal. Central Foveal Thickness: 238. Findings include retinal drusen , no SRF, outer retinal atrophy.   Notes Dry age-related macular degeneration still stable, no signs of active CN VM.                ASSESSMENT/PLAN:  No problem-specific Assessment & Plan notes found for this encounter.      ICD-10-CM   1. Exudative age-related macular degeneration of right eye with inactive choroidal neovascularization (HCC)  H35.3212 OCT, Retina - OU - Both Eyes  2. Intermediate stage nonexudative age-related macular degeneration of left eye  H35.3122   3. Retinal hemorrhage of right eye  H35.61   4. Intermediate stage nonexudative age-related macular degeneration of both eyes  H35.3132     1.  2.  3.  Ophthalmic Meds Ordered this visit:  No orders of the defined types were placed in this encounter.      Return in about 6 months (around 05/27/2020) for DILATE OU, OCT.  There are no Patient Instructions on file for this  visit.   Explained the diagnoses, plan, and follow up with the patient and they expressed understanding.   Patient expressed understanding of the importance of proper follow up care.   Clent Demark Binnie Droessler M.D. Diseases & Surgery of the Retina and Vitreous [facility] [place] 11/26/19     Abbreviations: M myopia (nearsighted); A astigmatism; H hyperopia (farsighted); P presbyopia; Mrx spectacle prescription;  CTL contact lenses; OD right eye; OS left eye; OU both eyes  XT exotropia; ET esotropia; PEK punctate epithelial keratitis; PEE punctate epithelial erosions; DES dry eye syndrome; MGD meibomian gland dysfunction; ATs artificial tears; PFAT's preservative free artificial tears; [place] nuclear sclerotic cataract; PSC posterior subcapsular cataract; ERM epi-retinal membrane; PVD posterior vitreous detachment; RD retinal detachment; DM diabetes mellitus; DR diabetic retinopathy; NPDR non-proliferative diabetic retinopathy; PDR proliferative diabetic retinopathy; CSME clinically significant macular edema; DME diabetic macular edema; dbh dot blot hemorrhages; CWS cotton wool spot; POAG primary open angle glaucoma; C/D cup-to-disc ratio; HVF humphrey visual field; GVF goldmann visual field; OCT optical coherence tomography; IOP intraocular pressure; BRVO Branch retinal vein occlusion; CRVO central retinal vein occlusion; CRAO central retinal artery occlusion; BRAO branch retinal artery occlusion; RT retinal tear; SB scleral buckle; PPV pars plana vitrectomy; VH Vitreous hemorrhage; PRP panretinal laser photocoagulation; IVK intravitreal kenalog; VMT vitreomacular traction; MH Macular hole;  NVD neovascularization of the disc; NVE neovascularization elsewhere; AREDS age related eye disease study; ARMD age related macular degeneration; POAG primary open angle glaucoma; EBMD epithelial/anterior basement membrane dystrophy; ACIOL anterior chamber intraocular lens; IOL intraocular lens; PCIOL posterior chamber intraocular lens; Phaco/IOL phacoemulsification with intraocular lens placement; [place] photorefractive  keratectomy; LASIK laser assisted in situ keratomileusis; HTN hypertension; DM diabetes mellitus; COPD chronic obstructive pulmonary disease

## 2020-01-23 ENCOUNTER — Other Ambulatory Visit: Payer: Self-pay | Admitting: Internal Medicine

## 2020-01-23 DIAGNOSIS — F419 Anxiety disorder, unspecified: Secondary | ICD-10-CM

## 2020-01-24 ENCOUNTER — Ambulatory Visit (INDEPENDENT_AMBULATORY_CARE_PROVIDER_SITE_OTHER): Payer: Medicare PPO | Admitting: Internal Medicine

## 2020-01-24 ENCOUNTER — Other Ambulatory Visit: Payer: Self-pay

## 2020-01-24 ENCOUNTER — Encounter: Payer: Self-pay | Admitting: Internal Medicine

## 2020-01-24 VITALS — BP 122/74 | HR 67 | Temp 97.5°F | Ht 64.0 in | Wt 126.8 lb

## 2020-01-24 DIAGNOSIS — R109 Unspecified abdominal pain: Secondary | ICD-10-CM

## 2020-01-24 DIAGNOSIS — R350 Frequency of micturition: Secondary | ICD-10-CM

## 2020-01-24 DIAGNOSIS — H3561 Retinal hemorrhage, right eye: Secondary | ICD-10-CM

## 2020-01-24 DIAGNOSIS — R42 Dizziness and giddiness: Secondary | ICD-10-CM | POA: Diagnosis not present

## 2020-01-24 NOTE — Progress Notes (Signed)
Location:  [facility] clinic Provider:  Khoi Hamberger L. Renato Gails, D.O., C.M.D.  Code Status: DNR Goals of Care:  Advanced Directives 01/24/2020  Does Patient Have a Medical Advance Directive? Yes  Type of Advance Directive Healthcare Power of Attorney  Does patient want to make changes to medical advance directive? No - Patient declined  Copy of Healthcare Power of Attorney in Chart? Yes - validated most recent copy scanned in chart (See row information)  Would patient like information on creating a medical advance directive? -     Chief Complaint  Patient presents with  . Acute Visit    feeling dizzy for about a week., urinary frequency    HPI: Patient is a 86 y.o. male seen today for medical management of chronic diseases.    He's had a solid week of being dizzy and the meclizine is not helping.  He's even a little dizzy sitting in the chair.  8/10 dizzy sitting in the chair.  Bothers him when he turns his head.  His right eye is bad.  I'm blurry if he closes the left eye.  He's no longer getting injections--care optimized.  No headache.  No tinnitus.  He feels "a little " dizzy/lightheaded when he stands up--goes partially away as he walks.    Has fallen a lot lately.  Likes to take the trash can out on the front porch and walks on the front walk.  He fell Tuesday using his rollator walker.  He says he uses it uses it all the time, but he was caught by his daughter w/o it this morning.    He's up often at night to pee.  He uses pads.  He is up a lot of times.  They can't quantify.  Not burning.  No difficulty with stream--does not come immediately though.  When he has to go he has to go.  He's also hurt some across his lower back the past few days.     Past Medical History:  Diagnosis Date  . Anxiety   . Arthritis    LOWER BACK PAIN   . CAD (coronary artery disease)   . Dehydration 2020   [facility]   . Depression   . Dizziness   . Hypercholesterolemia   . Hypertension 06/16/2011  .  Low blood pressure   . NSTEMI (non-ST elevated myocardial infarction) (HCC)    06/16/2011   [facility] [place]     Past Surgical History:  Procedure Laterality Date  . CARDIAC CATHETERIZATION    . CATARACT EXTRACTION W/ INTRAOCULAR LENS  IMPLANT, BILATERAL    . CORONARY ANGIOPLASTY     06/2011   DR [place]   . HERNIA REPAIR    . JOINT REPLACEMENT    . KNEE SURGERY    . LEFT HEART CATHETERIZATION WITH CORONARY ANGIOGRAM N/A 06/13/2011   Procedure: LEFT HEART CATHETERIZATION WITH CORONARY ANGIOGRAM;  Surgeon: Peter M [place], MD;  Location: [facility] CATH LAB;  Service: Cardiovascular;  Laterality: N/A;  . SHOULDER SURGERY  1997    No Known Allergies  Outpatient Encounter Medications as of 01/24/2020  Medication Sig  . acetaminophen (TYLENOL) 500 MG tablet Take 2 tablets (1,000 mg total) by mouth every 8 (eight) hours.  . citalopram (CELEXA) 40 MG tablet TAKE 1 TABLET BY MOUTH EVERY DAY  . hydrALAZINE (APRESOLINE) 25 MG tablet Take 25 mg by mouth daily.  . meclizine (ANTIVERT) 12.5 MG tablet   . rosuvastatin (CRESTOR) 5 MG tablet Take 1 tablet (5 mg total)  by mouth daily.   No facility-administered encounter medications on file as of 01/24/2020.    Review of Systems:  Review of Systems  Constitutional: Positive for malaise/fatigue. Negative for chills and fever.  HENT: Positive for hearing loss.   Eyes: Positive for blurred vision.  Respiratory: Negative for cough and shortness of breath.   Cardiovascular: Negative for chest pain, palpitations and leg swelling.  Gastrointestinal: Negative for abdominal pain, blood in stool, constipation, diarrhea and melena.  Genitourinary: Positive for frequency and urgency. Negative for dysuria.       Incontinence  Musculoskeletal: Positive for falls. Negative for joint pain.  Skin: Negative for itching and rash.  Neurological: Positive for dizziness, tingling and sensory change.  Endo/Heme/Allergies: Bruises/bleeds easily.  Psychiatric/Behavioral: Positive for  memory loss. Negative for depression. The patient does not have insomnia.     Health Maintenance  Topic Date Due  . PNA vac Low Risk Adult (1 of 2 - PCV13) Never done  . INFLUENZA VACCINE  01/06/2020  . TETANUS/TDAP  08/06/2023  . COVID-19 Vaccine  Completed    Physical Exam: Vitals:   01/24/20 1502  BP: 122/74  Pulse: 67  Temp: (!) 97.5 F (36.4 C)  SpO2: 97%  Weight: 126 lb 12.8 oz (57.5 kg)  Height: 5\' 4"  (1.626 m)   Body mass index is 21.77 kg/m. Physical Exam Vitals reviewed.  Constitutional:      General: He is not in acute distress.    Appearance: Normal appearance. He is not toxic-appearing.  HENT:     Head: Normocephalic and atraumatic.  Eyes:     Extraocular Movements: Extraocular movements intact.     Pupils: Pupils are equal, round, and reactive to light.  Cardiovascular:     Rate and Rhythm: Normal rate and regular rhythm.     Pulses: Normal pulses.     Heart sounds: Normal heart sounds.     Comments: Was NOT orthostatic with BP or HR when done by CMA Pulmonary:     Effort: Pulmonary effort is normal.     Breath sounds: Normal breath sounds. No wheezing, rhonchi or rales.  Abdominal:     General: Bowel sounds are normal.  Musculoskeletal:        General: Normal range of motion.     Cervical back: Neck supple.     Right lower leg: No edema.     Left lower leg: No edema.     Comments: Uses cane, unsteady  Skin:    General: Skin is warm and dry.  Neurological:     Mental Status: He is alert.     Sensory: Sensory deficit present.     Gait: Gait abnormal.     Comments: No nystagmus, some cerumen impaction, but daughter was not keen on him getting ears cleaned  Psychiatric:        Mood and Affect: Mood normal.     Labs reviewed: Basic Metabolic Panel: Recent Labs    01/25/19 0932 08/27/19 0958  NA 132* 138  K 3.5 4.9  CL 99 103  CO2 24 27  GLUCOSE 136* 91  BUN 26* 38*  CREATININE 1.83* 1.85*  CALCIUM 8.3* 9.0  TSH  --  4.34   Liver  Function Tests: Recent Labs    01/25/19 0932 08/27/19 0958  AST 147* 18  ALT 94* 11  ALKPHOS 90  --   BILITOT 1.1 0.7  PROT 5.7* 6.4  ALBUMIN 3.2*  --    Recent Labs    01/25/19  0932  LIPASE 21   No results for input(s): AMMONIA in the last 8760 hours. CBC: Recent Labs    01/25/19 0932 08/27/19 0958  WBC 8.1 8.8  NEUTROABS 7.1 6,169  HGB 11.5* 11.0*  HCT 34.1* 33.2*  MCV 95.5 97.1  PLT 205 305   Lipid Panel: Recent Labs    08/27/19 0958  CHOL 105  HDL 37*  LDLCALC 53  TRIG 74  CHOLHDL 2.8   Lab Results  Component Value Date   HGBA1C 6.0 (H) 06/12/2011    Assessment/Plan 1. Dizziness - does not seems to be BPPV and not responding to meclizine (not that I've really seen that work) - suspect role of neuropathy, vision decline and frailty; also may have urine infection with his worsening incontinence -previous MRI brain did not show any NPH, had chronic small vessel ischemic disease and generalized atrophy - Basic metabolic panel - CBC with Differential/Platelet -if testing negative, recommend home health therapy PT, OT   2. Urinary frequency - worse with more incontinence especially overnight - f/u labs to eval and r/o UTI - PSA - Urinalysis, Routine w reflex microscopic - Urine Culture  3. Flank pain - unclear if it's really flank or back and he had not mentioned it to his daughter before the appt, again r/o urinary cause - PSA - Urinalysis, Routine w reflex microscopic - Urine Culture  4. Retinal hemorrhage of right eye -h/o of with poor vision in right eye -I wound up suggesting he use a patch if this was impacting his balance any which seems considerably worse than a few months ago  Labs/tests ordered:   Lab Orders     Urine Culture     MICROSCOPIC MESSAGE     PSA     Urinalysis, Routine w reflex microscopic     Basic metabolic panel     CBC with Differential/Platelet  Next appt:  05/26/2020 med mgt   Isabella Ida L. Tevita Gomer,  D.O. Geriatrics [facility] [address], [place] [address] Cell Phone (Mon-Fri 8am-5pm):  [phone] On Call:  [phone] & follow prompts after 5pm & weekends Office Phone:  [phone] Office Fax:  [phone]

## 2020-01-25 LAB — URINALYSIS, ROUTINE W REFLEX MICROSCOPIC
Bacteria, UA: NONE SEEN /HPF
Bilirubin Urine: NEGATIVE
Glucose, UA: NEGATIVE
Hgb urine dipstick: NEGATIVE
Nitrite: NEGATIVE
RBC / HPF: NONE SEEN /HPF (ref 0–2)
Specific Gravity, Urine: 1.018 (ref 1.001–1.03)
Squamous Epithelial / HPF: NONE SEEN /HPF (ref <=5)
pH: <=5 (ref 5.0–8.0)

## 2020-01-25 LAB — BASIC METABOLIC PANEL
BUN/Creatinine Ratio: 17 (calc) (ref 6–22)
BUN: 36 mg/dL — ABNORMAL HIGH (ref 7–25)
CO2: 26 mmol/L (ref 20–32)
Calcium: 9.4 mg/dL (ref 8.6–10.3)
Chloride: 99 mmol/L (ref 98–110)
Creat: 2.08 mg/dL — ABNORMAL HIGH (ref 0.70–1.11)
Glucose, Bld: 97 mg/dL (ref 65–139)
Potassium: 5.1 mmol/L (ref 3.5–5.3)
Sodium: 135 mmol/L (ref 135–146)

## 2020-01-25 LAB — CBC WITH DIFFERENTIAL/PLATELET
Absolute Monocytes: 1080 cells/uL — ABNORMAL HIGH (ref 200–950)
Basophils Absolute: 24 cells/uL (ref 0–200)
Basophils Relative: 0.2 %
Eosinophils Absolute: 96 cells/uL (ref 15–500)
Eosinophils Relative: 0.8 %
HCT: 35.9 % — ABNORMAL LOW (ref 38.5–50.0)
Hemoglobin: 12.1 g/dL — ABNORMAL LOW (ref 13.2–17.1)
Lymphs Abs: 1812 cells/uL (ref 850–3900)
MCH: 33.2 pg — ABNORMAL HIGH (ref 27.0–33.0)
MCHC: 33.7 g/dL (ref 32.0–36.0)
MCV: 98.6 fL (ref 80.0–100.0)
MPV: 10.2 fL (ref 7.5–12.5)
Monocytes Relative: 9 %
Neutro Abs: 8988 cells/uL — ABNORMAL HIGH (ref 1500–7800)
Neutrophils Relative %: 74.9 %
Platelets: 356 10*3/uL (ref 140–400)
RBC: 3.64 10*6/uL — ABNORMAL LOW (ref 4.20–5.80)
RDW: 12.2 % (ref 11.0–15.0)
Total Lymphocyte: 15.1 %
WBC: 12 10*3/uL — ABNORMAL HIGH (ref 3.8–10.8)

## 2020-01-25 LAB — URINE CULTURE
MICRO NUMBER:: 10848263
SPECIMEN QUALITY:: ADEQUATE

## 2020-01-25 LAB — PSA: PSA: 0.8 ng/mL (ref <=4.0)

## 2020-01-25 NOTE — Progress Notes (Signed)
Prostate test was fine. Blood count panel showed a slight elevation of white blood cells suggestive usually for infection.   Urine culture is pending. Kidney function has worsened.  It is crucial that he drink more water--6 8oz glasses per day (48oz).

## 2020-01-28 ENCOUNTER — Other Ambulatory Visit: Payer: Self-pay | Admitting: *Deleted

## 2020-01-28 DIAGNOSIS — D729 Disorder of white blood cells, unspecified: Secondary | ICD-10-CM

## 2020-01-28 DIAGNOSIS — R350 Frequency of micturition: Secondary | ICD-10-CM

## 2020-01-28 NOTE — Progress Notes (Signed)
Culture grew out mixed organisms suggestive of contamination of the sample.  Recollection was recommended.  It's crucial that he use the wipes in the restroom before he does his sample.

## 2020-01-29 ENCOUNTER — Other Ambulatory Visit: Payer: Self-pay

## 2020-01-29 ENCOUNTER — Other Ambulatory Visit: Payer: Medicare PPO

## 2020-01-29 DIAGNOSIS — R350 Frequency of micturition: Secondary | ICD-10-CM | POA: Diagnosis not present

## 2020-01-29 DIAGNOSIS — D729 Disorder of white blood cells, unspecified: Secondary | ICD-10-CM

## 2020-01-30 LAB — URINALYSIS
Bilirubin Urine: NEGATIVE
Glucose, UA: NEGATIVE
Hgb urine dipstick: NEGATIVE
Ketones, ur: NEGATIVE
Nitrite: NEGATIVE
Specific Gravity, Urine: 1.022 (ref 1.001–1.03)
pH: 6 (ref 5.0–8.0)

## 2020-01-30 NOTE — Progress Notes (Signed)
Initial urine test has trace white blood cells.  Waiting on culture again.

## 2020-01-31 LAB — URINE CULTURE
MICRO NUMBER:: 10867097
SPECIMEN QUALITY:: ADEQUATE

## 2020-01-31 NOTE — Progress Notes (Signed)
Mixed bacteria grew out again.   Given his elevated white blood cells and worsening incontinence, I'd like to treat him as though he has a UTI and see if he improves with his unsteadiness.   I'd like for him to take keflex 500mg  po bid for 7 days.   Please notify his daughter

## 2020-02-01 ENCOUNTER — Other Ambulatory Visit: Payer: Self-pay

## 2020-02-01 ENCOUNTER — Telehealth: Payer: Self-pay

## 2020-02-01 MED ORDER — CEPHALEXIN 500 MG PO CAPS: 500.0000 mg | ORAL_CAPSULE | Freq: Two times a day (BID) | ORAL | 0 refills | Status: AC

## 2020-02-01 NOTE — Telephone Encounter (Signed)
Script was sent to [facility] on [address] [employer]) per [name] his daughter who will be picking it up

## 2020-03-07 ENCOUNTER — Emergency Department (HOSPITAL_COMMUNITY)
Admission: EM | Admit: 2020-03-07 | Discharge: 2020-03-08 | Disposition: A | Discharge status: Home / Self Care | Payer: Medicare PPO | Source: Home / Self Care

## 2020-03-07 ENCOUNTER — Emergency Department (HOSPITAL_COMMUNITY): Payer: Medicare PPO

## 2020-03-07 ENCOUNTER — Encounter (HOSPITAL_COMMUNITY): Payer: Self-pay | Admitting: Emergency Medicine

## 2020-03-07 DIAGNOSIS — I1310 Hypertensive heart and chronic kidney disease without heart failure, with stage 1 through stage 4 chronic kidney disease, or unspecified chronic kidney disease: Secondary | ICD-10-CM | POA: Diagnosis present

## 2020-03-07 DIAGNOSIS — J189 Pneumonia, unspecified organism: Secondary | ICD-10-CM | POA: Diagnosis present

## 2020-03-07 DIAGNOSIS — R9389 Abnormal findings on diagnostic imaging of other specified body structures: Secondary | ICD-10-CM | POA: Diagnosis not present

## 2020-03-07 DIAGNOSIS — E785 Hyperlipidemia, unspecified: Secondary | ICD-10-CM | POA: Diagnosis present

## 2020-03-07 DIAGNOSIS — R296 Repeated falls: Secondary | ICD-10-CM | POA: Diagnosis not present

## 2020-03-07 DIAGNOSIS — N1832 Chronic kidney disease, stage 3b: Secondary | ICD-10-CM | POA: Diagnosis present

## 2020-03-07 DIAGNOSIS — R42 Dizziness and giddiness: Secondary | ICD-10-CM | POA: Diagnosis not present

## 2020-03-07 DIAGNOSIS — I639 Cerebral infarction, unspecified: Secondary | ICD-10-CM | POA: Diagnosis present

## 2020-03-07 DIAGNOSIS — I2510 Atherosclerotic heart disease of native coronary artery without angina pectoris: Secondary | ICD-10-CM | POA: Diagnosis present

## 2020-03-07 DIAGNOSIS — R0902 Hypoxemia: Secondary | ICD-10-CM | POA: Diagnosis not present

## 2020-03-07 DIAGNOSIS — R4182 Altered mental status, unspecified: Secondary | ICD-10-CM | POA: Diagnosis present

## 2020-03-07 DIAGNOSIS — Z20822 Contact with and (suspected) exposure to covid-19: Secondary | ICD-10-CM | POA: Diagnosis present

## 2020-03-07 DIAGNOSIS — I441 Atrioventricular block, second degree: Secondary | ICD-10-CM | POA: Diagnosis present

## 2020-03-07 DIAGNOSIS — I10 Essential (primary) hypertension: Secondary | ICD-10-CM | POA: Diagnosis not present

## 2020-03-07 DIAGNOSIS — Z955 Presence of coronary angioplasty implant and graft: Secondary | ICD-10-CM | POA: Diagnosis not present

## 2020-03-07 DIAGNOSIS — D181 Lymphangioma, any site: Secondary | ICD-10-CM | POA: Diagnosis not present

## 2020-03-07 DIAGNOSIS — G9608 Other cranial cerebrospinal fluid leak: Secondary | ICD-10-CM | POA: Diagnosis present

## 2020-03-07 DIAGNOSIS — R410 Disorientation, unspecified: Secondary | ICD-10-CM | POA: Diagnosis not present

## 2020-03-07 DIAGNOSIS — I619 Nontraumatic intracerebral hemorrhage, unspecified: Secondary | ICD-10-CM | POA: Diagnosis present

## 2020-03-07 DIAGNOSIS — Z7189 Other specified counseling: Secondary | ICD-10-CM | POA: Diagnosis not present

## 2020-03-07 DIAGNOSIS — Z5321 Procedure and treatment not carried out due to patient leaving prior to being seen by health care provider: Secondary | ICD-10-CM | POA: Insufficient documentation

## 2020-03-07 DIAGNOSIS — Z9181 History of falling: Secondary | ICD-10-CM | POA: Diagnosis not present

## 2020-03-07 DIAGNOSIS — R079 Chest pain, unspecified: Secondary | ICD-10-CM | POA: Diagnosis not present

## 2020-03-07 DIAGNOSIS — Z66 Do not resuscitate: Secondary | ICD-10-CM | POA: Diagnosis present

## 2020-03-07 DIAGNOSIS — F32A Depression, unspecified: Secondary | ICD-10-CM | POA: Diagnosis present

## 2020-03-07 DIAGNOSIS — M1990 Unspecified osteoarthritis, unspecified site: Secondary | ICD-10-CM | POA: Diagnosis present

## 2020-03-07 DIAGNOSIS — I252 Old myocardial infarction: Secondary | ICD-10-CM | POA: Diagnosis not present

## 2020-03-07 DIAGNOSIS — I700 Atherosclerosis of aorta: Secondary | ICD-10-CM | POA: Diagnosis not present

## 2020-03-07 DIAGNOSIS — I161 Hypertensive emergency: Secondary | ICD-10-CM | POA: Diagnosis present

## 2020-03-07 DIAGNOSIS — W19XXXA Unspecified fall, initial encounter: Secondary | ICD-10-CM | POA: Diagnosis present

## 2020-03-07 DIAGNOSIS — R001 Bradycardia, unspecified: Secondary | ICD-10-CM | POA: Diagnosis not present

## 2020-03-07 DIAGNOSIS — I517 Cardiomegaly: Secondary | ICD-10-CM | POA: Diagnosis not present

## 2020-03-07 DIAGNOSIS — Z043 Encounter for examination and observation following other accident: Secondary | ICD-10-CM | POA: Diagnosis not present

## 2020-03-07 DIAGNOSIS — H9190 Unspecified hearing loss, unspecified ear: Secondary | ICD-10-CM | POA: Diagnosis present

## 2020-03-07 DIAGNOSIS — I6200 Nontraumatic subdural hemorrhage, unspecified: Secondary | ICD-10-CM | POA: Diagnosis not present

## 2020-03-07 DIAGNOSIS — G9349 Other encephalopathy: Secondary | ICD-10-CM | POA: Diagnosis present

## 2020-03-07 DIAGNOSIS — R1310 Dysphagia, unspecified: Secondary | ICD-10-CM | POA: Diagnosis present

## 2020-03-07 DIAGNOSIS — E871 Hypo-osmolality and hyponatremia: Secondary | ICD-10-CM | POA: Diagnosis present

## 2020-03-07 DIAGNOSIS — R55 Syncope and collapse: Secondary | ICD-10-CM | POA: Diagnosis not present

## 2020-03-07 DIAGNOSIS — Z515 Encounter for palliative care: Secondary | ICD-10-CM | POA: Diagnosis not present

## 2020-03-07 DIAGNOSIS — Z8673 Personal history of transient ischemic attack (TIA), and cerebral infarction without residual deficits: Secondary | ICD-10-CM | POA: Diagnosis not present

## 2020-03-07 DIAGNOSIS — S22080A Wedge compression fracture of T11-T12 vertebra, initial encounter for closed fracture: Secondary | ICD-10-CM | POA: Diagnosis not present

## 2020-03-07 DIAGNOSIS — F419 Anxiety disorder, unspecified: Secondary | ICD-10-CM | POA: Diagnosis present

## 2020-03-07 LAB — CBC
HCT: 36.6 % — ABNORMAL LOW (ref 39.0–52.0)
Hemoglobin: 11.7 g/dL — ABNORMAL LOW (ref 13.0–17.0)
MCH: 31.4 pg (ref 26.0–34.0)
MCHC: 32 g/dL (ref 30.0–36.0)
MCV: 98.1 fL (ref 80.0–100.0)
Platelets: 300 10*3/uL (ref 150–400)
RBC: 3.73 MIL/uL — ABNORMAL LOW (ref 4.22–5.81)
RDW: 13.2 % (ref 11.5–15.5)
WBC: 8.8 10*3/uL (ref 4.0–10.5)
nRBC: 0 % (ref 0.0–0.2)

## 2020-03-07 LAB — BASIC METABOLIC PANEL
Anion gap: 10 (ref 5–15)
BUN: 30 mg/dL — ABNORMAL HIGH (ref 8–23)
CO2: 24 mmol/L (ref 22–32)
Calcium: 9.2 mg/dL (ref 8.9–10.3)
Chloride: 101 mmol/L (ref 98–111)
Creatinine, Ser: 2.25 mg/dL — ABNORMAL HIGH (ref 0.61–1.24)
GFR calc Af Amer: 28 mL/min — ABNORMAL LOW (ref >60)
GFR calc non Af Amer: 24 mL/min — ABNORMAL LOW (ref >60)
Glucose, Bld: 126 mg/dL — ABNORMAL HIGH (ref 70–99)
Potassium: 5.3 mmol/L — ABNORMAL HIGH (ref 3.5–5.1)
Sodium: 135 mmol/L (ref 135–145)

## 2020-03-07 LAB — TROPONIN I (HIGH SENSITIVITY)
Troponin I (High Sensitivity): 12 ng/L (ref <18)
Troponin I (High Sensitivity): 15 ng/L (ref <18)

## 2020-03-07 NOTE — ED Triage Notes (Signed)
Pt arrives via [facility] for c/o chest pain while at the gym, reports taking 1 sl nitro, pt found by bystanders on the floor after for assumed syncopal episode, intiial bp 130/60, repeat bp 205/128, ems ekg showed 1st degree av block and prolonged qt. RR 14, O2 sat 99%, HR 59. Pt alert, oriented to person and place, disoriented to situation.

## 2020-03-07 NOTE — ED Notes (Signed)
Pt left without being seen.

## 2020-03-10 ENCOUNTER — Inpatient Hospital Stay (HOSPITAL_COMMUNITY)
Admission: EM | Admit: 2020-03-10 | Discharge: 2020-03-17 | DRG: 064 | Disposition: A | Discharge status: Hospice | Payer: Medicare PPO | Attending: Internal Medicine | Admitting: Internal Medicine

## 2020-03-10 ENCOUNTER — Observation Stay (HOSPITAL_COMMUNITY): Payer: Medicare PPO

## 2020-03-10 ENCOUNTER — Emergency Department (HOSPITAL_COMMUNITY): Payer: Medicare PPO

## 2020-03-10 ENCOUNTER — Other Ambulatory Visit: Payer: Self-pay

## 2020-03-10 ENCOUNTER — Encounter (HOSPITAL_COMMUNITY): Payer: Self-pay

## 2020-03-10 DIAGNOSIS — R1310 Dysphagia, unspecified: Secondary | ICD-10-CM

## 2020-03-10 DIAGNOSIS — E785 Hyperlipidemia, unspecified: Secondary | ICD-10-CM | POA: Diagnosis present

## 2020-03-10 DIAGNOSIS — W19XXXA Unspecified fall, initial encounter: Secondary | ICD-10-CM | POA: Diagnosis present

## 2020-03-10 DIAGNOSIS — R4182 Altered mental status, unspecified: Secondary | ICD-10-CM | POA: Diagnosis present

## 2020-03-10 DIAGNOSIS — R296 Repeated falls: Secondary | ICD-10-CM

## 2020-03-10 DIAGNOSIS — Z8673 Personal history of transient ischemic attack (TIA), and cerebral infarction without residual deficits: Secondary | ICD-10-CM

## 2020-03-10 DIAGNOSIS — G9349 Other encephalopathy: Secondary | ICD-10-CM | POA: Diagnosis present

## 2020-03-10 DIAGNOSIS — E871 Hypo-osmolality and hyponatremia: Secondary | ICD-10-CM | POA: Diagnosis present

## 2020-03-10 DIAGNOSIS — I441 Atrioventricular block, second degree: Secondary | ICD-10-CM | POA: Diagnosis present

## 2020-03-10 DIAGNOSIS — H9190 Unspecified hearing loss, unspecified ear: Secondary | ICD-10-CM | POA: Diagnosis present

## 2020-03-10 DIAGNOSIS — N1832 Chronic kidney disease, stage 3b: Secondary | ICD-10-CM | POA: Diagnosis present

## 2020-03-10 DIAGNOSIS — Z515 Encounter for palliative care: Secondary | ICD-10-CM | POA: Diagnosis not present

## 2020-03-10 DIAGNOSIS — D181 Lymphangioma, any site: Secondary | ICD-10-CM | POA: Diagnosis not present

## 2020-03-10 DIAGNOSIS — I1310 Hypertensive heart and chronic kidney disease without heart failure, with stage 1 through stage 4 chronic kidney disease, or unspecified chronic kidney disease: Secondary | ICD-10-CM | POA: Diagnosis present

## 2020-03-10 DIAGNOSIS — F32A Depression, unspecified: Secondary | ICD-10-CM | POA: Diagnosis present

## 2020-03-10 DIAGNOSIS — R9389 Abnormal findings on diagnostic imaging of other specified body structures: Secondary | ICD-10-CM | POA: Diagnosis not present

## 2020-03-10 DIAGNOSIS — G9608 Other cranial cerebrospinal fluid leak: Secondary | ICD-10-CM | POA: Diagnosis present

## 2020-03-10 DIAGNOSIS — Z20822 Contact with and (suspected) exposure to covid-19: Secondary | ICD-10-CM | POA: Diagnosis present

## 2020-03-10 DIAGNOSIS — Z955 Presence of coronary angioplasty implant and graft: Secondary | ICD-10-CM | POA: Diagnosis not present

## 2020-03-10 DIAGNOSIS — R001 Bradycardia, unspecified: Secondary | ICD-10-CM | POA: Diagnosis not present

## 2020-03-10 DIAGNOSIS — I252 Old myocardial infarction: Secondary | ICD-10-CM | POA: Diagnosis not present

## 2020-03-10 DIAGNOSIS — I10 Essential (primary) hypertension: Secondary | ICD-10-CM | POA: Diagnosis not present

## 2020-03-10 DIAGNOSIS — F419 Anxiety disorder, unspecified: Secondary | ICD-10-CM | POA: Diagnosis present

## 2020-03-10 DIAGNOSIS — I639 Cerebral infarction, unspecified: Secondary | ICD-10-CM | POA: Diagnosis not present

## 2020-03-10 DIAGNOSIS — Z7189 Other specified counseling: Secondary | ICD-10-CM | POA: Diagnosis not present

## 2020-03-10 DIAGNOSIS — I161 Hypertensive emergency: Secondary | ICD-10-CM | POA: Diagnosis present

## 2020-03-10 DIAGNOSIS — M1990 Unspecified osteoarthritis, unspecified site: Secondary | ICD-10-CM | POA: Diagnosis present

## 2020-03-10 DIAGNOSIS — Z79899 Other long term (current) drug therapy: Secondary | ICD-10-CM

## 2020-03-10 DIAGNOSIS — Z66 Do not resuscitate: Secondary | ICD-10-CM | POA: Diagnosis present

## 2020-03-10 DIAGNOSIS — Z9181 History of falling: Secondary | ICD-10-CM

## 2020-03-10 DIAGNOSIS — I700 Atherosclerosis of aorta: Secondary | ICD-10-CM | POA: Diagnosis not present

## 2020-03-10 DIAGNOSIS — J189 Pneumonia, unspecified organism: Secondary | ICD-10-CM | POA: Diagnosis present

## 2020-03-10 DIAGNOSIS — R410 Disorientation, unspecified: Secondary | ICD-10-CM

## 2020-03-10 DIAGNOSIS — I2510 Atherosclerotic heart disease of native coronary artery without angina pectoris: Secondary | ICD-10-CM | POA: Diagnosis present

## 2020-03-10 DIAGNOSIS — I619 Nontraumatic intracerebral hemorrhage, unspecified: Secondary | ICD-10-CM | POA: Diagnosis present

## 2020-03-10 DIAGNOSIS — Z9841 Cataract extraction status, right eye: Secondary | ICD-10-CM

## 2020-03-10 DIAGNOSIS — Z961 Presence of intraocular lens: Secondary | ICD-10-CM | POA: Diagnosis present

## 2020-03-10 DIAGNOSIS — R42 Dizziness and giddiness: Secondary | ICD-10-CM | POA: Diagnosis not present

## 2020-03-10 DIAGNOSIS — R32 Unspecified urinary incontinence: Secondary | ICD-10-CM | POA: Diagnosis present

## 2020-03-10 DIAGNOSIS — Z043 Encounter for examination and observation following other accident: Secondary | ICD-10-CM | POA: Diagnosis not present

## 2020-03-10 DIAGNOSIS — S22080A Wedge compression fracture of T11-T12 vertebra, initial encounter for closed fracture: Secondary | ICD-10-CM | POA: Diagnosis not present

## 2020-03-10 DIAGNOSIS — Z9842 Cataract extraction status, left eye: Secondary | ICD-10-CM

## 2020-03-10 DIAGNOSIS — Z8249 Family history of ischemic heart disease and other diseases of the circulatory system: Secondary | ICD-10-CM

## 2020-03-10 DIAGNOSIS — Z87891 Personal history of nicotine dependence: Secondary | ICD-10-CM

## 2020-03-10 LAB — COMPREHENSIVE METABOLIC PANEL
ALT: 16 U/L (ref 0–44)
AST: 24 U/L (ref 15–41)
Albumin: 3.6 g/dL (ref 3.5–5.0)
Alkaline Phosphatase: 64 U/L (ref 38–126)
Anion gap: 11 (ref 5–15)
BUN: 29 mg/dL — ABNORMAL HIGH (ref 8–23)
CO2: 26 mmol/L (ref 22–32)
Calcium: 9 mg/dL (ref 8.9–10.3)
Chloride: 90 mmol/L — ABNORMAL LOW (ref 98–111)
Creatinine, Ser: 1.76 mg/dL — ABNORMAL HIGH (ref 0.61–1.24)
GFR calc Af Amer: 37 mL/min — ABNORMAL LOW (ref >60)
GFR calc non Af Amer: 32 mL/min — ABNORMAL LOW (ref >60)
Glucose, Bld: 131 mg/dL — ABNORMAL HIGH (ref 70–99)
Potassium: 3.7 mmol/L (ref 3.5–5.1)
Sodium: 127 mmol/L — ABNORMAL LOW (ref 135–145)
Total Bilirubin: 1.5 mg/dL — ABNORMAL HIGH (ref 0.3–1.2)
Total Protein: 6.6 g/dL (ref 6.5–8.1)

## 2020-03-10 LAB — URINALYSIS, ROUTINE W REFLEX MICROSCOPIC
Bacteria, UA: NONE SEEN
Bilirubin Urine: NEGATIVE
Glucose, UA: 50 mg/dL — AB
Ketones, ur: 5 mg/dL — AB
Leukocytes,Ua: NEGATIVE
Nitrite: NEGATIVE
Protein, ur: 30 mg/dL — AB
Specific Gravity, Urine: 1.011 (ref 1.005–1.030)
pH: 7 (ref 5.0–8.0)

## 2020-03-10 LAB — RESPIRATORY PANEL BY RT PCR (FLU A&B, COVID)
Influenza A by PCR: NEGATIVE
Influenza B by PCR: NEGATIVE
SARS Coronavirus 2 by RT PCR: NEGATIVE

## 2020-03-10 LAB — CBC
HCT: 35.8 % — ABNORMAL LOW (ref 39.0–52.0)
Hemoglobin: 12 g/dL — ABNORMAL LOW (ref 13.0–17.0)
MCH: 31.5 pg (ref 26.0–34.0)
MCHC: 33.5 g/dL (ref 30.0–36.0)
MCV: 94 fL (ref 80.0–100.0)
Platelets: 311 10*3/uL (ref 150–400)
RBC: 3.81 MIL/uL — ABNORMAL LOW (ref 4.22–5.81)
RDW: 12.8 % (ref 11.5–15.5)
WBC: 17.2 10*3/uL — ABNORMAL HIGH (ref 4.0–10.5)
nRBC: 0 % (ref 0.0–0.2)

## 2020-03-10 LAB — TSH: TSH: 2.739 u[IU]/mL (ref 0.350–4.500)

## 2020-03-10 LAB — PROCALCITONIN: Procalcitonin: <0.1 ng/mL

## 2020-03-10 LAB — CBG MONITORING, ED: Glucose-Capillary: 128 mg/dL — ABNORMAL HIGH (ref 70–99)

## 2020-03-10 MED ORDER — DOCUSATE SODIUM 100 MG PO CAPS: 100.0000 mg | ORAL_CAPSULE | Freq: Two times a day (BID) | ORAL | Status: DC

## 2020-03-10 MED ORDER — ONDANSETRON HCL 4 MG/2ML IJ SOLN: 4.0000 mg | Freq: Four times a day (QID) | INTRAMUSCULAR | Status: DC | PRN

## 2020-03-10 MED ORDER — LORAZEPAM 1 MG PO TABS: 1.0000 mg | ORAL_TABLET | ORAL | Status: DC | PRN

## 2020-03-10 MED ORDER — POLYVINYL ALCOHOL 1.4 % OP SOLN
1.0000 [drp] | Freq: Four times a day (QID) | OPHTHALMIC | Status: DC | PRN
  Filled 2020-03-10: qty 15

## 2020-03-10 MED ORDER — OXYCODONE HCL 5 MG PO TABS: 5.0000 mg | ORAL_TABLET | ORAL | Status: DC | PRN

## 2020-03-10 MED ORDER — LORAZEPAM 2 MG/ML IJ SOLN: 2.0000 mg | Freq: Four times a day (QID) | INTRAMUSCULAR | Status: DC | PRN

## 2020-03-10 MED ORDER — CITALOPRAM HYDROBROMIDE 40 MG PO TABS
40.0000 mg | ORAL_TABLET | Freq: Every day | ORAL | Status: DC
  Administered 2020-03-11 – 2020-03-17 (×7): 40 mg via ORAL
  Filled 2020-03-10 (×7): qty 1

## 2020-03-10 MED ORDER — HALOPERIDOL LACTATE 2 MG/ML PO CONC
0.5000 mg | ORAL | Status: DC | PRN
  Filled 2020-03-10: qty 0.3

## 2020-03-10 MED ORDER — HYDROMORPHONE BOLUS VIA INFUSION
0.5000 mg | INTRAVENOUS | Status: DC | PRN
  Filled 2020-03-10: qty 1

## 2020-03-10 MED ORDER — LACTATED RINGERS IV SOLN: INTRAVENOUS | Status: DC

## 2020-03-10 MED ORDER — ACETAMINOPHEN 325 MG PO TABS: 650.0000 mg | ORAL_TABLET | Freq: Four times a day (QID) | ORAL | Status: DC | PRN

## 2020-03-10 MED ORDER — BISACODYL 5 MG PO TBEC: 5.0000 mg | DELAYED_RELEASE_TABLET | Freq: Every day | ORAL | Status: DC | PRN

## 2020-03-10 MED ORDER — BIOTENE DRY MOUTH MT LIQD: 15.0000 mL | OROMUCOSAL | Status: DC | PRN

## 2020-03-10 MED ORDER — MECLIZINE HCL 25 MG PO TABS: 25.0000 mg | ORAL_TABLET | Freq: Three times a day (TID) | ORAL | Status: DC | PRN

## 2020-03-10 MED ORDER — LORAZEPAM 2 MG/ML PO CONC: 1.0000 mg | ORAL | Status: DC | PRN

## 2020-03-10 MED ORDER — HYDRALAZINE HCL 20 MG/ML IJ SOLN
20.0000 mg | Freq: Once | INTRAMUSCULAR | Status: AC
  Administered 2020-03-10: 20 mg via INTRAVENOUS
  Filled 2020-03-10: qty 1

## 2020-03-10 MED ORDER — SODIUM CHLORIDE 0.9 % IV SOLN
1.0000 g | Freq: Once | INTRAVENOUS | Status: AC
  Administered 2020-03-10: 1 g via INTRAVENOUS
  Filled 2020-03-10: qty 10

## 2020-03-10 MED ORDER — GLYCOPYRROLATE 0.2 MG/ML IJ SOLN: 0.2000 mg | INTRAMUSCULAR | Status: DC | PRN

## 2020-03-10 MED ORDER — SODIUM CHLORIDE 0.9 % IV SOLN
500.0000 mg | Freq: Once | INTRAVENOUS | Status: AC
  Administered 2020-03-10: 500 mg via INTRAVENOUS
  Filled 2020-03-10: qty 500

## 2020-03-10 MED ORDER — SODIUM CHLORIDE 0.9 % IV SOLN
1.0000 mg/h | INTRAVENOUS | Status: DC
  Filled 2020-03-10: qty 5

## 2020-03-10 MED ORDER — MORPHINE SULFATE (PF) 2 MG/ML IV SOLN: 2.0000 mg | INTRAVENOUS | Status: DC | PRN

## 2020-03-10 MED ORDER — HYDRALAZINE HCL 20 MG/ML IJ SOLN: 5.0000 mg | INTRAMUSCULAR | Status: DC | PRN

## 2020-03-10 MED ORDER — HYDROMORPHONE HCL 1 MG/ML IJ SOLN
0.2500 mg | INTRAMUSCULAR | Status: DC | PRN
  Administered 2020-03-14 (×2): 0.5 mg via INTRAVENOUS
  Filled 2020-03-10 (×2): qty 1

## 2020-03-10 MED ORDER — MECLIZINE HCL 12.5 MG PO TABS
12.5000 mg | ORAL_TABLET | Freq: Three times a day (TID) | ORAL | Status: DC | PRN
  Filled 2020-03-10 (×2): qty 1

## 2020-03-10 MED ORDER — DIPHENHYDRAMINE HCL 50 MG/ML IJ SOLN: 12.5000 mg | INTRAMUSCULAR | Status: DC | PRN

## 2020-03-10 MED ORDER — ONDANSETRON 4 MG PO TBDP: 4.0000 mg | ORAL_TABLET | Freq: Four times a day (QID) | ORAL | Status: DC | PRN

## 2020-03-10 MED ORDER — HYDRALAZINE HCL 25 MG PO TABS
12.5000 mg | ORAL_TABLET | Freq: Two times a day (BID) | ORAL | Status: DC
  Administered 2020-03-10 – 2020-03-17 (×14): 12.5 mg via ORAL
  Filled 2020-03-10 (×14): qty 1

## 2020-03-10 MED ORDER — HALOPERIDOL LACTATE 5 MG/ML IJ SOLN: 0.5000 mg | INTRAMUSCULAR | Status: DC | PRN

## 2020-03-10 MED ORDER — GLYCOPYRROLATE 1 MG PO TABS
1.0000 mg | ORAL_TABLET | ORAL | Status: DC | PRN
  Filled 2020-03-10: qty 1

## 2020-03-10 MED ORDER — SODIUM CHLORIDE 0.9% FLUSH: 3.0000 mL | Freq: Two times a day (BID) | INTRAVENOUS | Status: DC

## 2020-03-10 MED ORDER — ROSUVASTATIN CALCIUM 5 MG PO TABS: 5.0000 mg | ORAL_TABLET | Freq: Every day | ORAL | Status: DC

## 2020-03-10 MED ORDER — POLYETHYLENE GLYCOL 3350 17 G PO PACK: 17.0000 g | PACK | Freq: Every day | ORAL | Status: DC | PRN

## 2020-03-10 MED ORDER — ACETAMINOPHEN 650 MG RE SUPP: 650.0000 mg | Freq: Four times a day (QID) | RECTAL | Status: DC | PRN

## 2020-03-10 MED ORDER — CITALOPRAM HYDROBROMIDE 10 MG PO TABS: 40.0000 mg | ORAL_TABLET | Freq: Every day | ORAL | Status: DC

## 2020-03-10 MED ORDER — LORAZEPAM 2 MG/ML IJ SOLN: 0.2500 mg | INTRAMUSCULAR | Status: DC | PRN

## 2020-03-10 MED ORDER — ONDANSETRON HCL 4 MG PO TABS: 4.0000 mg | ORAL_TABLET | Freq: Four times a day (QID) | ORAL | Status: DC | PRN

## 2020-03-10 MED ORDER — HALOPERIDOL 0.5 MG PO TABS
0.5000 mg | ORAL_TABLET | ORAL | Status: DC | PRN
  Filled 2020-03-10: qty 1

## 2020-03-10 MED ORDER — LORAZEPAM 2 MG/ML IJ SOLN: 1.0000 mg | INTRAMUSCULAR | Status: DC | PRN

## 2020-03-10 NOTE — Progress Notes (Signed)
Wasted 100 ml Dilaudid drip in stericycle and it was witnessed by Fredia Beets, RN. It was brought up from ED but then never needed and got DC'd.

## 2020-03-10 NOTE — ED Provider Notes (Signed)
[facility] EMERGENCY DEPARTMENT Provider Note  CSN: [id number] Arrival date & time: 03/10/20 [id number]    History Chief Complaint  Patient presents with  . Altered Mental Status    HPI  Leon Morris is a 86 y.o. male brought to the ED via EMS from home. He has had dizziness for a few days, here 3 days ago for dizziness and chest pain but left before being seen. Per EMS, the patient was found by his daughter on the floor next to his bed this morning. He is complaining of dizziness, described as 'sort of' like room spinning. Denies any headache or pain. He has had increased confusion since last week as well. Per RN, patient noted to be incontinent of a strong smelling urine.    Past Medical History:  Diagnosis Date  . Anxiety   . Arthritis    LOWER BACK PAIN   . CAD (coronary artery disease)   . Dehydration 2020   [facility]   . Depression   . Dizziness   . Hypercholesterolemia   . Hypertension 06/16/2011  . Low blood pressure   . NSTEMI (non-ST elevated myocardial infarction) (HCC)    06/16/2011   DR [place]     Past Surgical History:  Procedure Laterality Date  . CARDIAC CATHETERIZATION    . CATARACT EXTRACTION W/ INTRAOCULAR LENS  IMPLANT, BILATERAL    . CORONARY ANGIOPLASTY     06/2011   DR [place]   . HERNIA REPAIR    . JOINT REPLACEMENT    . KNEE SURGERY    . LEFT HEART CATHETERIZATION WITH CORONARY ANGIOGRAM N/A 06/13/2011   Procedure: LEFT HEART CATHETERIZATION WITH CORONARY ANGIOGRAM;  Surgeon: Peter M [place], MD;  [facility];  Service: Cardiovascular;  Laterality: N/A;  . SHOULDER SURGERY  1997    Family History  Problem Relation Age of Onset  . Lung cancer Father   . CAD Father   . CAD Brother   . Heart attack Neg Hx   . Hypertension Neg Hx   . Stroke Neg Hx     Social History   Tobacco Use  . Smoking status: Former Smoker    Quit date: 05/18/1941    Years since quitting: 78.8  . Smokeless tobacco: Never Used  Vaping Use  . Vaping  Use: Never used  Substance Use Topics  . Alcohol use: No  . Drug use: No     Home Medications Prior to Admission medications   Medication Sig Start Date End Date Taking? Authorizing Provider  acetaminophen (TYLENOL) 500 MG tablet Take 2 tablets (1,000 mg total) by mouth every 8 (eight) hours. 11/15/15   Ghimire, Werner LeanShanker M, MD  citalopram (CELEXA) 40 MG tablet TAKE 1 TABLET BY MOUTH EVERY DAY 01/23/20   Reed, Tiffany L, DO  hydrALAZINE (APRESOLINE) 25 MG tablet Take 25 mg by mouth daily.    [provider]  meclizine (ANTIVERT) 12.5 MG tablet  06/20/19   [provider]  rosuvastatin (CRESTOR) 5 MG tablet Take 1 tablet (5 mg total) by mouth daily. 12/29/15   Corky CraftsVaranasi, Jayadeep S, MD     Allergies    Patient has no known allergies.   Review of Systems   Review of Systems Unable to assess due to mental status.     Physical Exam BP (!) 224/92   Pulse 60   Temp 98.8 F (37.1 C) (Rectal)   Resp 20   Ht 5\' 4"  (1.626 m)   Wt 57.5  kg   SpO2 96%   BMI 21.76 kg/m   Physical Exam Vitals and nursing note reviewed.  Constitutional:      Appearance: Normal appearance.  HENT:     Head: Normocephalic and atraumatic.     Nose: Nose normal.     Mouth/Throat:     Mouth: Mucous membranes are moist.  Eyes:     Extraocular Movements: Extraocular movements intact.     Conjunctiva/sclera: Conjunctivae normal.  Cardiovascular:     Rate and Rhythm: Normal rate.  Pulmonary:     Effort: Pulmonary effort is normal.     Breath sounds: Normal breath sounds.  Abdominal:     General: Abdomen is flat.     Palpations: Abdomen is soft.     Tenderness: There is no abdominal tenderness.  Musculoskeletal:        General: No swelling. Normal range of motion.     Cervical back: Neck supple.  Skin:    General: Skin is warm and dry.  Neurological:     General: No focal deficit present.     Mental Status: He is alert. He is disoriented.     Cranial Nerves: No cranial nerve  deficit.     Motor: No weakness.  Psychiatric:        Mood and Affect: Mood normal.      ED Results / Procedures / Treatments   Labs (all labs ordered are listed, but only abnormal results are displayed) Labs Reviewed  COMPREHENSIVE METABOLIC PANEL - Abnormal; Notable for the following components:      Result Value   Sodium 127 (*)    Chloride 90 (*)    Glucose, Bld 131 (*)    BUN 29 (*)    Creatinine, Ser 1.76 (*)    Total Bilirubin 1.5 (*)    GFR calc non Af Amer 32 (*)    GFR calc Af Amer 37 (*)    All other components within normal limits  CBC - Abnormal; Notable for the following components:   WBC 17.2 (*)    RBC 3.81 (*)    Hemoglobin 12.0 (*)    HCT 35.8 (*)    All other components within normal limits  URINALYSIS, ROUTINE W REFLEX MICROSCOPIC - Abnormal; Notable for the following components:   Color, Urine STRAW (*)    Glucose, UA 50 (*)    Hgb urine dipstick SMALL (*)    Ketones, ur 5 (*)    Protein, ur 30 (*)    All other components within normal limits  CBG MONITORING, ED - Abnormal; Notable for the following components:   Glucose-Capillary 128 (*)    All other components within normal limits  RESPIRATORY PANEL BY RT PCR (FLU A&B, COVID)    EKG EKG Interpretation  Date/Time:  Monday March 10 2020 07:37:41 EDT Ventricular Rate:  59 PR Interval:  200 QRS Duration: 84 QT Interval:  472 QTC Calculation: 467 R Axis:   26 Text Interpretation: Sinus bradycardia with occasional Premature ventricular complexes and Premature atrial complexes Nonspecific ST abnormality Abnormal ECG No significant change since last tracing Confirmed by Susy Frizzle [phone]) on 03/10/2020 8:16:14 AM   Radiology CT Head Wo Contrast  Result Date: 03/10/2020 CLINICAL DATA:  Syncopal episode.  Dizziness. EXAM: CT HEAD WITHOUT CONTRAST TECHNIQUE: Contiguous axial images were obtained from the base of the skull through the vertex without intravenous contrast. COMPARISON:   02/18/2016 FINDINGS: Brain: There is a low-attenuation subdural fluid collection on the right side which  has mild mass effect with slight compression of the frontal horn of the right lateral ventricle and mild right-to-left shift estimated at 4 mm. This is likely an acute subdural hygroma. Given the mild mass effect and shift I would recommend a follow-up CT scan in 24 hours. There also appear to be areas of remote subcortical infarcts surrounded by slightly high attenuation gyri. I suspect this is laminar necrosis with some mineralization. I do not think this is acute blood such as subarachnoid hemorrhage. I do not see any high attenuation fluid in the sulci. Stable age related cerebral atrophy, ventriculomegaly and periventricular white matter disease. The brainstem and cerebellum are grossly normal in stable. Vascular: Stable vascular calcifications. No aneurysm or hyperdense vessels. Skull: No skull fracture. Probable osteoma involving the left high parietal region is unchanged. Sinuses/Orbits: The paranasal sinuses and mastoid air cells are clear. The globes are intact. Other: No scalp laceration or hematoma. IMPRESSION: 1. Low-attenuation subdural fluid collection on the right side with mild mass effect and mild right-to-left shift estimated at 4 mm. This is likely an acute subdural hygroma. No hemorrhage is identified. 2. Remote subcortical infarcts in the right cerebral hemisphere with probable laminar necrosis and mineralization as detailed above. No findings for acute hemispheric infarction. 3. Stable age related cerebral atrophy, ventriculomegaly and periventricular white matter disease. Electronically Signed   By: Rudie Meyer M.D.   On: 03/10/2020 10:03   DG Chest Port 1 View  Result Date: 03/10/2020 CLINICAL DATA:  Confusion, dizziness, fall, leukocytosis. EXAM: PORTABLE CHEST 1 VIEW COMPARISON:  03/07/2020 FINDINGS: New left basilar consolidation with silhouetting of the left diaphragm. No right  pleural effusion. Limited evaluation for left pleural effusion given left basilar opacity. No pneumothorax. Cardiac silhouette is upper limits of normal, similar to prior. Coronary artery stent. Mediastinal contour is similar over multiple priors dating back to 2013. Thoracic dextrocurvature. Polyarticular degenerative change with osteopenia. IMPRESSION: 1. New left basilar consolidation, concerning for aspiration and/or pneumonia. 2. Borderline cardiomegaly, similar to prior. Electronically Signed   By: Feliberto Harts MD   On: 03/10/2020 09:18    Procedures Procedures  Medications Ordered in the ED Medications  azithromycin (ZITHROMAX) 500 mg in sodium chloride 0.9 % 250 mL IVPB (has no administration in time range)  hydrALAZINE (APRESOLINE) injection 20 mg (has no administration in time range)  cefTRIAXone (ROCEPHIN) 1 g in sodium chloride 0.9 % 100 mL IVPB (1 g Intravenous New Bag/Given 03/10/20 1019)     MDM Rules/Calculators/A&P MDM Patient with mild confusion here, does not know date/year, but knows he lives with his daughter. Labs from 3 days ago reviewed, normal WBC then but increased creatinine from baseline. WBC today is now elevated, although no fever on rectal temp. Exam is relatively benign, will check CT head given reported fall. Awaiting chemistry and UA today.  ED Course  I have reviewed the triage vital signs and the nursing notes.  Pertinent labs & imaging results that were available during my care of the patient were reviewed by me and considered in my medical decision making (see chart for details).  Clinical Course as of Mar 11 1111  Mon Mar 10, 2020  0905 CMP with mild hyponatremia, Cr is improved from last week.    [CS]  0926 CXR concerning for LLL consolidation. Given confusion and leukocytosis will given Rocephin and Zithromax to cover CAP. No reported vomiting or choking to suggest aspiration.   [CS]  0943 UA neg for infection.   [CS]  1007  CT images  reviewed, subdural hygroma new from previous imaging. Will discuss with neurosurgery.    [CS]  1015 Patient's BP is increasing. HR is borderline low. He takes Hydralazine at home. Will discuss optimal BP management with Neurosurgery.    [CS]  1047 Spoke with Dr. Maurice Small, Neurosurgery who will evaluate the patient. He does not recommend any acute intervention, suspects this hygroma is from a chronic subdural. He states BP management should not be affected by this finding. Hydralazine ordered. Medicine Paged for admission.    [CS]  1111 Spoke with Dr. Ophelia Charter, Hospitalist, who will evaluate the patient for admission.    [CS]    Clinical Course User Index [CS] Pollyann Savoy, MD    Final Clinical Impression(s) / ED Diagnoses Final diagnoses:  Altered mental status, unspecified altered mental status type  Community acquired pneumonia of left lower lobe of lung  Subdural hygroma    Rx / DC Orders ED Discharge Orders    None       Pollyann Savoy, MD 03/10/20 1112

## 2020-03-10 NOTE — Progress Notes (Signed)
CT chest negative for infiltrate.  However, MRI is positive for multiple CVAs with hemorrhagic conversion.  With this information, I called his daughter back and she wants to ensure that he is comfortable and stop aggressive treatments.  Will request palliative care consult.  Will change to palliative care status with comfort care only.   Georgana Curio, M.D.

## 2020-03-10 NOTE — ED Notes (Signed)
Patient transported to CT 

## 2020-03-10 NOTE — ED Triage Notes (Signed)
Pt reports dizziness for a few days.  

## 2020-03-10 NOTE — Consult Note (Signed)
Neurosurgery Consultation  Reason for Consult: Subdural hygroma Referring Physician: Bernette Mayers  CC: Dizziness  HPI: This is a 86 y.o. man w/ a h/o Mobitz type 2, CAD on clopidogrel, and dizziness that presents with a chief complaint of dizziness. However, by report, his daughter found him on the floor next to his bed this morning. He is mildly confused but is able to provide some history regarding his dizziness. It is episodic, has a vertiginous component, does seem to be worse with certain movements, no associated or aura-like symptoms surrounding the events. He does not know if he has a known diagnosis to explain this phenomenon. He currently denies any head trauma, no headache, no nausea, no emesis today.   ROS: A 14 point ROS was performed and is negative except as noted in the HPI.   PMHx:  Past Medical History:  Diagnosis Date  . Anxiety   . Arthritis    LOWER BACK PAIN   . CAD (coronary artery disease)   . Dehydration 2020   [facility]   . Depression   . Dizziness   . Hypercholesterolemia   . Hypertension 06/16/2011  . Low blood pressure   . NSTEMI (non-ST elevated myocardial infarction) (HCC)    06/16/2011   DR [place]    FamHx:  Family History  Problem Relation Age of Onset  . Lung cancer Father   . CAD Father   . CAD Brother   . Heart attack Neg Hx   . Hypertension Neg Hx   . Stroke Neg Hx    SocHx:  reports that he quit smoking about 78 years ago. He has never used smokeless tobacco. He reports that he does not drink alcohol and does not use drugs.  Exam: Vital signs in last 24 hours: Temp:  [97.5 F (36.4 C)-98.8 F (37.1 C)] 98.8 F (37.1 C) (10/04 0819) Pulse Rate:  [60-68] 60 (10/04 0945) Resp:  [16-20] 20 (10/04 0945) BP: (190-224)/(86-92) 224/92 (10/04 0945) SpO2:  [96 %-98 %] 96 % (10/04 0945) Weight:  [57.5 kg] 57.5 kg (10/04 0907) General: Awake, alert, cooperative, lying in bed in NAD Head: Normocephalic and atruamatic HEENT: Neck  supple Pulmonary: breathing room air comfortably, no evidence of increased work of breathing Cardiac: Bradycardic Abdomen: S NT ND Extremities: Warm and well perfused x4, some fresh blood on the 4th digit of the right hand without evidence of current bleeding - looks like a likely fingerstick BG wound Neuro: AOx1 - unable to say where he is, thinks it's in the 20th century, PERRL, gaze neutral, FS, some mild perseveration with "yes" but inconsistent, also a component of significant hearing loss bilaterally Strength diffusely 5/5 x4, follows commands x4 but requires persistence and rephrasing to get him to do some movements, not coordinated enough for drift testing   Assessment and Plan: 86 y.o. man with h/o dizziness, found next to his bed by his daughter, confused on exam with some mild perseveration but otherwise non-focal. CTH personally reviewed, which shows a right hypodense subdural collection measuring 21mm with 42mm of MLS. CXR with LLL consolidation in the setting of leukocytosis.  -no acute neurosurgical intervention indicated at this time. Given his clopidogrel and long history of dizziness, likely had an unwitnessed fall in the interval between today and his prior MRI (01/25/19). I do not think this is an acute subdural hygroma given his lack of significant head trauma and therefore is a chronic collection that is unlikely to be the cause of his AMS, especially given  his pneumonia. He has a history of multiple falls with dizziness including a hip frx. -from my standpoint, okay to continue clopidogrel if needed. There is no acute hemorrhage and the patient is unable to reliably tell me his cardiac history, so I am not sure of the indication or need for clopidogrel. But should likely evaluate risk/benefit ratio given his multiple falls and that he likely had an acute subdural hematoma that is now chronic. Given that he has a chronic collection, he is likely at increased risk of hemorrhage in the  future while on anti-platelet therapy. -please call with any concerns or questions  Jadene Pierini, MD 03/10/20 10:51 AM [facility]

## 2020-03-10 NOTE — Consult Note (Signed)
Consultation Note Date: 03/10/2020   Patient Name: Leon Morris  DOB: 1923-04-19  MRN: [id number]  Age / Sex: 86 y.o., male  PCP: Gayland Curry, DO Referring Physician: Karmen Bongo, MD  Reason for Consultation: Establishing goals of care, Non pain symptom management and Psychosocial/spiritual support  HPI/Patient Profile: 86 y.o. male  with past medical history of mobitz type 2, CAD, and episodic dizziness who was admitted on 03/10/2020 after being found on the floor this morning next to his bed.  Per notes he fell last Friday and has had increased confusion over the weekend.  Today he has had increased nausea and fatigue.  His blood pressure has been very high (224/92) when it is normally low.  CXR shows LLL consolidation concerning for aspiration pneumonia.  He failed a swallow study in the ER.  MR Brain shows multiple small strokes in the right frontal, parietal, and temporal lobes - some with hemorrhagic conversion.  He has a slight midline shift.   [facility] admitting physician discussed comfort measures with his daughter Leon Morris and she was agreeable.   Clinical Assessment and Goals of Care:  I have reviewed medical records including EPIC notes, labs and imaging, received report from the RN, examined the patient and met at bedside with his daughter Leon Morris  to discuss diagnosis prognosis, Brent, EOL wishes, disposition and options.  Mr. Najarian appeared in no distress and spoke with me coherently.  He was surprised to learn he was in the hospital and had had several small strokes but it did not seem to alarm him.  He said he was hungry.  I introduced Palliative Medicine as specialized medical care for people living with serious illness. It focuses on providing relief from the symptoms and stress of a serious illness.   We discussed a brief life review of the patient.  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  He is Nurse, learning disability.  His wife passed away many years ago.    Leon Morris and I discussed discussed his current illness and what it means in the larger context of her on-going co-morbidities.  Natural disease trajectory and expectations at EOL were discussed.  Mr. Berent appears comfortable on no comfort medications at this point.  I explained to Leon Morris that God could take him very quickly (given his very high BPs and brain hemorrhages) or he may live on for days/weeks we simply do not know.  Leon Morris was quite distraught by the possibility of him passing.   I discussed his aspiration pneumonia with Leon Morris and explained the on-going risk of aspiration.  Leon Morris understands and feels he really needs to eat something despite the risk.  She requested scrambled eggs.    The difference between aggressive medical intervention and comfort care was considered in light of the patient's goals of care.  A focus on his comfort is desired.  At this point he is certainly a candidate for Hospice services at home or at [facility] facility.  Hospice and Palliative Care services outpatient were explained and offered.  Leon Morris would like to wait for her brother Leon Morris to arrive and discuss the options with him.   I will give Leon Morris a call.  Questions and concerns were addressed.  The family was encouraged to call with questions or concerns.   Primary Decision Maker:  NEXT OF KIN Leon Morris daughter.    SUMMARY OF RECOMMENDATIONS   DC dilaudid gtt as patient appears quite comfortable and coherent. Will order comfort meds PRN and restart his home meds. D2 diet.  Family accepts the risk of aspiration and wants him fed (he was eating corn on the cob and chicken at home last night) PMT will follow up tomorrow to assess.  Recommend Hospice at home or Hospice facility.  Code Status/Advance Care Planning:  DNR   Symptom Management:   PRN dilaudid, lorazepam, meclizine.     Additional Recommendations (Limitations, Scope, Preferences):  Full Comfort Care  Palliative Prophylaxis:   Frequent Pain Assessment  Psycho-social/Spiritual:  Desire for further Chaplaincy support: patient is Catholic and would welcome a chaplain.  Prognosis: difficult to determine.   Literally hours to less than 2 weeks given hypertensive urgency, hemorrhagic strokes and aspiration pneumonia.    Discharge Planning: To Be Determined  Recommend Hospice at home or [facility].      Primary Diagnoses: Present on Admission: . AMS (altered mental status) . Hypertension . Hyperlipidemia . Abnormal chest x-ray . Stage 3b chronic kidney disease ([place]) . DNR (do not resuscitate) . Subdural hygroma . Acute CVA (cerebrovascular accident) ([place])   I have reviewed the medical record, interviewed the patient and family, and examined the patient. The following aspects are pertinent.  Past Medical History:  Diagnosis Date  . Anxiety   . Arthritis    LOWER BACK PAIN   . CAD (coronary artery disease)   . Dehydration 2020   [facility]   . Depression   . Dizziness   . Hypercholesterolemia   . Hypertension 06/16/2011  . Low blood pressure   . NSTEMI (non-ST elevated myocardial infarction) ([place])    06/16/2011   DR [place]    Social History   Socioeconomic History  . Marital status: Widowed    Spouse name: Not on file  . Number of children: Not on file  . Years of education: Not on file  . Highest education level: Not on file  Occupational History  . Not on file  Tobacco Use  . Smoking status: Former Smoker    Quit date: 05/18/1941    Years since quitting: 78.8  . Smokeless tobacco: Never Used  Vaping Use  . Vaping Use: Never used  Substance and Sexual Activity  . Alcohol use: No  . Drug use: No  . Sexual activity: Never  Other Topics Concern  . Not on file  Social History Narrative   Social History      Diet? No       Do you drink/eat things with caffeine?  No       Marital status?         Widowed                           What year were you married? 1952      Do you live in a house, apartment, assisted living, condo, trailer, etc.? house      Is it one or more stories? 1      How  many persons live in your home? 2      Do you have any pets in your home? (please list) 1 cat       Highest level of education completed? 12      Current or past profession: sales rep      Do you exercise?         yes                             Type & how often? Walk every day and gym when able       Advanced Directives      Do you have a living will? yes      Do you have a DNR form?                                  If not, do you want to discuss one? No       Do you have signed POA/HPOA for forms? In process       Functional Status      Do you have difficulty bathing or dressing yourself? No       Do you have difficulty preparing food or eating? No       Do you have difficulty managing your medications? No       Do you have difficulty managing your finances? No       Do you have difficulty affording your medications? No       Social Determinants of Health   Financial Resource Strain:   . Difficulty of Paying Living Expenses: Not on file  Food Insecurity:   . Worried About Charity fundraiser in the Last Year: Not on file  . Ran Out of Food in the Last Year: Not on file  Transportation Needs:   . Lack of Transportation (Medical): Not on file  . Lack of Transportation (Non-Medical): Not on file  Physical Activity:   . Days of Exercise per Week: Not on file  . Minutes of Exercise per Session: Not on file  Stress:   . Feeling of Stress : Not on file  Social Connections:   . Frequency of Communication with Friends and Family: Not on file  . Frequency of Social Gatherings with Friends and Family: Not on file  . Attends Religious Services: Not on file  . Active Member of Clubs or Organizations: Not on file  . Attends Archivist  Meetings: Not on file  . Marital Status: Not on file   Family History  Problem Relation Age of Onset  . Lung cancer Father   . CAD Father   . CAD Brother   . Heart attack Neg Hx   . Hypertension Neg Hx   . Stroke Neg Hx     No Known Allergies    Vital Signs: BP 128/65 (BP Location: Right Arm)   Pulse 62   Temp 98 F (36.7 C) (Oral)   Resp 14   Ht 5' 4"  (1.626 m)   Wt 57.5 kg   SpO2 96%   BMI 21.76 kg/m  Pain Scale: 0-10   Pain Score: 0-No pain   SpO2: SpO2: 96 % O2 Device:SpO2: 96 % O2 Flow Rate: .     Palliative Assessment/Data: 30%     Time In: 5:00 Time Out: 6:11 Time Total: 71 min. Visit consisted of counseling  and education dealing with the complex and emotionally intense issues surrounding the need for palliative care and symptom management in the setting of serious and potentially life-threatening illness. Greater than 50%  of this time was spent counseling and coordinating care related to the above assessment and plan.  Signed by: Florentina Jenny, PA-C Palliative Medicine  Please contact Palliative Medicine Team phone at [phone] for questions and concerns.  For individual provider: See Shea Evans

## 2020-03-10 NOTE — ED Triage Notes (Signed)
Pt BIB [facility] from home, pt seen here Friday for AMS/syncope, per daughter pt had increase confusion since Friday, she found him on the floor beside his bed. Pt alert and oriented to person, place and time, hx of the same    HR 50-60's irregular  BP 198/106 RR 16 98% RA  CBG 100 99.0

## 2020-03-10 NOTE — ED Notes (Signed)
Patient transported to MRI 

## 2020-03-10 NOTE — Progress Notes (Signed)
Pt admitted to 6N06 via bed from ED for comfort care. Pt appears to be resting comfortably at present.

## 2020-03-10 NOTE — ED Notes (Addendum)
Dr Assunta Curtis about pts BP, states to hold off on bp meds and continue to monitor

## 2020-03-10 NOTE — H&P (Addendum)
History and Physical    Leon Morris [facility] UJW:[id number] DOB: 1922/08/12 DOA: 03/10/2020  PCP: Kermit Balo, DO Consultants:  Ladona Ridgel - cardiology  Patient coming from:  Home - lives with daughter; NOK: [name], [phone]; [phone]  Chief Complaint: AMS   HPI: Leon Morris is a 86 y.o. male with medical history significant of CAD; HTN; and HLD presenting with AMS.  His daughter reports fall due to dizziness on Friday, may have hit his head.  He left without being seen after almost 8 hours.  Over the weekend, he has been extremely dizzy - has intermittent dizzy spells over the last few months, much milder and taking Meclizine for that.  His BP has been high and he has been extremely confused.  He doesn't remember things.  He was found on the floor this AM.  She doesn't know how he got on the floor and doesn't know if he hit his head.  He has not been having cough.  No apparent aspiration.  Increased belching with intermittent nausea.  No fever.  No apparent SOB.  He has been very fatigued.  "It's like someone flipped a switch" that made him confused.  His BP was 190/140 this AM which is markedly abnormal for him, as he tend to run low.    ED Course:  Dizziness and fall last Friday, left without being seen.  Increased confusion over the weekend.  Found down this AM.  Head CT with acute subdural hygroma on CT, neurosurgery doesn't see need for intervention.  Also has PNA?  Review of Systems: As per HPI; otherwise review of systems reviewed and negative.   Ambulatory Status:  Ambulates with a cane or walker  COVID Vaccine Status:  Complete  Past Medical History:  Diagnosis Date  . Anxiety   . Arthritis    LOWER BACK PAIN   . CAD (coronary artery disease)   . Dehydration 2020   [facility]   . Depression   . Dizziness   . Hypercholesterolemia   . Hypertension 06/16/2011  . Low blood pressure   . NSTEMI (non-ST elevated myocardial infarction) (HCC)     06/16/2011   DR [place]     Past Surgical History:  Procedure Laterality Date  . CARDIAC CATHETERIZATION    . CATARACT EXTRACTION W/ INTRAOCULAR LENS  IMPLANT, BILATERAL    . CORONARY ANGIOPLASTY     06/2011   DR [place]   . HERNIA REPAIR    . JOINT REPLACEMENT    . KNEE SURGERY    . LEFT HEART CATHETERIZATION WITH CORONARY ANGIOGRAM N/A 06/13/2011   Procedure: LEFT HEART CATHETERIZATION WITH CORONARY ANGIOGRAM;  Surgeon: Peter M [place], MD;  Location: [facility] CATH LAB;  Service: Cardiovascular;  Laterality: N/A;  . SHOULDER SURGERY  1997    Social History   Socioeconomic History  . Marital status: Widowed    Spouse name: Not on file  . Number of children: Not on file  . Years of education: Not on file  . Highest education level: Not on file  Occupational History  . Not on file  Tobacco Use  . Smoking status: Former Smoker    Quit date: 05/18/1941    Years since quitting: 78.8  . Smokeless tobacco: Never Used  Vaping Use  . Vaping Use: Never used  Substance and Sexual Activity  . Alcohol use: No  . Drug use: No  . Sexual activity: Never  Other Topics Concern  . Not on file  Social  History Narrative   Social History      Diet? No       Do you drink/eat things with caffeine? No       Marital status?         Widowed                           What year were you married? 1952      Do you live in a house, apartment, assisted living, condo, trailer, etc.? house      Is it one or more stories? 1      How many persons live in your home? 2      Do you have any pets in your home? (please list) 1 cat       Highest level of education completed? 12      Current or past profession: sales rep      Do you exercise?         yes                             Type & how often? Walk every day and gym when able       Advanced Directives      Do you have a living will? yes      Do you have a DNR form?                                  If not, do you want to discuss one? No       Do you  have signed POA/HPOA for forms? In process       Functional Status      Do you have difficulty bathing or dressing yourself? No       Do you have difficulty preparing food or eating? No       Do you have difficulty managing your medications? No       Do you have difficulty managing your finances? No       Do you have difficulty affording your medications? No       Social Determinants of Health   Financial Resource Strain:   . Difficulty of Paying Living Expenses: Not on file  Food Insecurity:   . Worried About Programme researcher, broadcasting/film/video in the Last Year: Not on file  . Ran Out of Food in the Last Year: Not on file  Transportation Needs:   . Lack of Transportation (Medical): Not on file  . Lack of Transportation (Non-Medical): Not on file  Physical Activity:   . Days of Exercise per Week: Not on file  . Minutes of Exercise per Session: Not on file  Stress:   . Feeling of Stress : Not on file  Social Connections:   . Frequency of Communication with Friends and Family: Not on file  . Frequency of Social Gatherings with Friends and Family: Not on file  . Attends Religious Services: Not on file  . Active Member of Clubs or Organizations: Not on file  . Attends Banker Meetings: Not on file  . Marital Status: Not on file  Intimate Partner Violence:   . Fear of Current or Ex-Partner: Not on file  . Emotionally Abused: Not on file  . Physically Abused: Not on file  . Sexually Abused: Not on file  No Known Allergies  Family History  Problem Relation Age of Onset  . Lung cancer Father   . CAD Father   . CAD Brother   . Heart attack Neg Hx   . Hypertension Neg Hx   . Stroke Neg Hx     Prior to Admission medications   Medication Sig Start Date End Date Taking? Authorizing Provider  acetaminophen (TYLENOL) 500 MG tablet Take 2 tablets (1,000 mg total) by mouth every 8 (eight) hours. 11/15/15   Ghimire, Werner Lean, MD  citalopram (CELEXA) 40 MG tablet TAKE 1 TABLET  BY MOUTH EVERY DAY 01/23/20   Reed, Tiffany L, DO  hydrALAZINE (APRESOLINE) 25 MG tablet Take 25 mg by mouth daily.    [provider]  meclizine (ANTIVERT) 12.5 MG tablet  06/20/19   [provider]  rosuvastatin (CRESTOR) 5 MG tablet Take 1 tablet (5 mg total) by mouth daily. 12/29/15   Corky Crafts, MD    Physical Exam: Vitals:   03/10/20 1100 03/10/20 1150 03/10/20 1238 03/10/20 1243  BP: (!) 185/80 135/86 (!) 106/38 (!) 118/45  Pulse: (!) 57 80 (!) 58   Resp: 17 19 16    Temp:  97.8 F (36.6 C)    TempSrc:  Oral    SpO2: 95% 98% 98%   Weight:      Height:         . General:  Appears calm and comfortable and in NAD; he is unable to provide a good history but acknowledges feeling very dizzy . Eyes:  PERRL, EOMI, normal lids, iris . ENT:  grossly normal hearing, lips & tongue, mmm; mostly absent dentition . Neck:  no LAD, masses or thyromegaly . Cardiovascular:  RRR, no m/r/g. No LE edema.  Respiratory:   LLL rhonchi.  Normal respiratory effort. . Abdomen:  soft, NT, ND, NABS . Skin:  no rash or induration seen on limited exam . Musculoskeletal:  grossly normal tone BUE/BLE, good ROM, no bony abnormality . Psychiatric:  grossly normal mood and affect, speech fluent but mildly inappropriate, AOx1-2 Neurologic:  CN 2-12 grossly intact, moves all extremities in coordinated fashion   Radiological Exams on Admission: CT Head Wo Contrast  Result Date: 03/10/2020 CLINICAL DATA:  Syncopal episode.  Dizziness. EXAM: CT HEAD WITHOUT CONTRAST TECHNIQUE: Contiguous axial images were obtained from the base of the skull through the vertex without intravenous contrast. COMPARISON:  02/18/2016 FINDINGS: Brain: There is a low-attenuation subdural fluid collection on the right side which has mild mass effect with slight compression of the frontal horn of the right lateral ventricle and mild right-to-left shift estimated at 4 mm. This is likely an acute subdural hygroma.  Given the mild mass effect and shift I would recommend a follow-up CT scan in 24 hours. There also appear to be areas of remote subcortical infarcts surrounded by slightly high attenuation gyri. I suspect this is laminar necrosis with some mineralization. I do not think this is acute blood such as subarachnoid hemorrhage. I do not see any high attenuation fluid in the sulci. Stable age related cerebral atrophy, ventriculomegaly and periventricular white matter disease. The brainstem and cerebellum are grossly normal in stable. Vascular: Stable vascular calcifications. No aneurysm or hyperdense vessels. Skull: No skull fracture. Probable osteoma involving the left high parietal region is unchanged. Sinuses/Orbits: The paranasal sinuses and mastoid air cells are clear. The globes are intact. Other: No scalp laceration or hematoma. IMPRESSION: 1. Low-attenuation subdural fluid collection on the right side with  mild mass effect and mild right-to-left shift estimated at 4 mm. This is likely an acute subdural hygroma. No hemorrhage is identified. 2. Remote subcortical infarcts in the right cerebral hemisphere with probable laminar necrosis and mineralization as detailed above. No findings for acute hemispheric infarction. 3. Stable age related cerebral atrophy, ventriculomegaly and periventricular white matter disease. Electronically Signed   By: Rudie Meyer M.D.   On: 03/10/2020 10:03   DG Chest Port 1 View  Result Date: 03/10/2020 CLINICAL DATA:  Confusion, dizziness, fall, leukocytosis. EXAM: PORTABLE CHEST 1 VIEW COMPARISON:  03/07/2020 FINDINGS: New left basilar consolidation with silhouetting of the left diaphragm. No right pleural effusion. Limited evaluation for left pleural effusion given left basilar opacity. No pneumothorax. Cardiac silhouette is upper limits of normal, similar to prior. Coronary artery stent. Mediastinal contour is similar over multiple priors dating back to 2013. Thoracic  dextrocurvature. Polyarticular degenerative change with osteopenia. IMPRESSION: 1. New left basilar consolidation, concerning for aspiration and/or pneumonia. 2. Borderline cardiomegaly, similar to prior. Electronically Signed   By: Feliberto Harts MD   On: 03/10/2020 09:18    EKG: Independently reviewed.  NSR with rate 59; nonspecific ST changes with no evidence of acute ischemia   Labs on Admission: I have personally reviewed the available labs and imaging studies at the time of the admission.  Pertinent labs:   Na++ 127 Glucose 131 BUN 29/Creatinine 1.76/GFR 32 - better than baseline Bili 1.5 WBC 17.2 Hgb 12.0 UA: 50 glucose, small Hgb, 5 ketones, 30 protein   Assessment/Plan Principal Problem:   AMS (altered mental status) Active Problems:   Hypertension   Hyperlipidemia   Falls frequently   Abnormal chest x-ray   Dysphagia   Stage 3b chronic kidney disease (HCC)   DNR (do not resuscitate)   Subdural hygroma   AMS -Patient with recurrent episodic chronic dizziness, acutely worsened since late last week -He has had falls due to the severity of his dizziness -Evaluation thus far indicates possible PNA (see below) and subdural hygroma (see below) -Patient is currently alert and able to answer questions but not oriented and history is poor -He does appear to be mildly dehydrated, as evidenced by his ketonuria, but he does not have renal failure at this time -Based on unremarkable evaluation with current ability to protect his airway, will observe for now with IVF hydration and telemetry monitoring in progressive care -50% of patients with delirium while hospitalized will be institutionalized at 6 months, and these patients have a 25% mortality at 6 months -The family would benefit from being referred to the [facility] and also provided with the [employer] website -Will obtain MRI, as CT does have evidence of remote infarcts; in this patient with acutely  altered mental status in conjunction with acute and severely worsened dizziness and BP lability, CVA remains a consideration  Falls -Patient with recurrent falls, thought to be related to dizziness -No obvious injuries on evaluation -Head CT with what was read as acute subdural hygroma with evidence of midline shift -Dr. Maurice Small from neurosurgery has reviewed the imaging and reports that this appears to be chronic SDH (>6 weeks) rather than acute and that further imaging does not appear to be necessary at this time -He does not appear to be on Plavix at this time, and Lovenox will be held as per neurosurgery recommendation -Will request PT/OT evaluation  Abnormal CXR -CXR was read as LLL infiltrate and he does appear to have LLL rhonchi on exam -  However, he has not had any respiratory symptoms including SOB/hypoxia/cough -Will obtain CT chest for further evaluation -He was given Rocephin and Azithromycin while in the ER; will hold further antibiotics at this time -It is possible that he had an aspiration event while suffering from AMS but it is not clear that antibiotics are needed at this time -Will also order procalcitonin level  HTN - possible HTN crisis -Patient has a h/o HTN but tends to run borderline low -His daughter reports that he was markedly hypertensive at home this weekend -It is also possible that his AMS is related to hypertensive crisis -He received IV hydralazine in the ER and is now back to borderline low -Will hold additional antihypertensives for now and continue to follow without further intervention  Dysphagia -Patient without report of current or past apparent dysphagia -His daughter denies coughing/choking/sputtering with eating/drinking -However, he failed his bedside swallow evaluation -This may be related to AMS but is also suggestive of possible aspiration -MRI and chest CT are pending, as above -Speech therapy swallow evaluation is  pending  HLD -Continue Crestor  Stage 3b CKD -Actually appears to be better than baseline at this time -Given ketonuria on presentation, will hydrate with IVF for now - particularly since he is NPO  DNR -I have discussed code status with the patient's daughter and he would not desire resuscitation and would prefer to die a natural death should that situation arise. -He will need a gold out of facility DNR form at the time of discharge   Note: This patient has been tested and is negative for the novel coronavirus COVID-19. The patient has been fully vaccinated against COVID-19.    DVT prophylaxis:  SCDs Code Status:  DNR - confirmed with family Family Communication: None present; I spoke with the patient's daughter by telephone at the time of admission. Disposition Plan:  The patient is from: home  Anticipated d/c is to: to be determined based on clinical progress  Anticipated d/c date will depend on clinical response to treatment, but possibly as early as tomorrow if he has excellent response to treatment  Patient is currently: acutely ill Consults called: Neurosurgery; PT/OT/ST Admission status:  It is my clinical opinion that referral for OBSERVATION is reasonable and necessary in this patient based on the above information provided. The aforementioned taken together are felt to place the patient at high risk for further clinical deterioration. However it is anticipated that the patient may be medically stable for discharge from the hospital within 24 to 48 hours.    Jonah BlueJennifer Amitai Delaughter MD [facility]   How to contact the [facility] Attending or Consulting provider 7A - 7P or covering provider during after hours 7P -7A, for this patient?  1. Check the care team in [facility] and look for a) attending/consulting TR[facility] provider listed and b) the [facility] team listed 2. Log into [email or web address] and use [place]'s universal password to access. If you do not have the password, please contact the  hospital operator. 3. Locate the [facility] provider you are looking for under [facility] and page to a number that you can be directly reached. 4. If you still have difficulty reaching the provider, please page the [facility] (Director on Call) for the Hospitalists listed on amion for assistance.   03/10/2020, 1:12 PM

## 2020-03-10 NOTE — ED Notes (Signed)
Main lab to add on procalcitonin. 

## 2020-03-10 NOTE — ED Notes (Signed)
Pt returned from MRI, resting comfortably.

## 2020-03-11 ENCOUNTER — Telehealth: Payer: Self-pay

## 2020-03-11 DIAGNOSIS — J189 Pneumonia, unspecified organism: Secondary | ICD-10-CM | POA: Diagnosis not present

## 2020-03-11 DIAGNOSIS — I639 Cerebral infarction, unspecified: Secondary | ICD-10-CM | POA: Diagnosis not present

## 2020-03-11 DIAGNOSIS — R4182 Altered mental status, unspecified: Secondary | ICD-10-CM | POA: Diagnosis not present

## 2020-03-11 DIAGNOSIS — G9608 Other cranial cerebrospinal fluid leak: Secondary | ICD-10-CM | POA: Diagnosis not present

## 2020-03-11 DIAGNOSIS — R410 Disorientation, unspecified: Secondary | ICD-10-CM | POA: Diagnosis not present

## 2020-03-11 MED ORDER — AMLODIPINE BESYLATE 5 MG PO TABS
5.0000 mg | ORAL_TABLET | Freq: Every day | ORAL | Status: DC
  Administered 2020-03-11 – 2020-03-14 (×4): 5 mg via ORAL
  Filled 2020-03-11 (×4): qty 1

## 2020-03-11 MED ORDER — HYDRALAZINE HCL 20 MG/ML IJ SOLN
10.0000 mg | Freq: Four times a day (QID) | INTRAMUSCULAR | Status: DC | PRN
  Filled 2020-03-11 (×2): qty 1

## 2020-03-11 MED ORDER — ENSURE ENLIVE PO LIQD
237.0000 mL | Freq: Two times a day (BID) | ORAL | Status: DC
  Administered 2020-03-11 – 2020-03-17 (×12): 237 mL via ORAL

## 2020-03-11 MED ORDER — ATROPINE SULFATE 1 % OP SOLN
2.0000 [drp] | Freq: Four times a day (QID) | OPHTHALMIC | Status: DC | PRN
  Filled 2020-03-11: qty 2

## 2020-03-11 NOTE — Progress Notes (Signed)
Patent examiner [facility]) Hospital Liaison: RN note     Notified by Transition of Care Manger Ronny Flurry, RN  of patient/family request for [facility] services at home after discharge. Chart and patient information under review by [facility] physician. Hospice eligibility pending currently.     Writer spoke with son, Orpah Greek  to initiate education related to hospice philosophy, services and team approach to care.[name] verbalized understanding of information given.  Please send signed and completed DNR form home with patient/family. Patient will need prescriptions for discharge comfort medications.      DME needs have been discussed, patient currently has the following equipment in the home: walker and W/C.  Patient/family requests the following DME for delivery to the home: hospital bed and 3N1. [facility] equipment manager has been notified and will contact DME provider to arrange delivery to the home. Home address has been verified and is correct in the chart. [name]  is the family member to contact to arrange time of delivery.      [facility] aware of the above. Please notify [facility] when patient is ready to leave the unit at discharge. (Call [phone] or [phone] after 5pm.) [facility] information and contact numbers given to  [name].       Please call with any hospice related questions.      Thank you for this referral.     Elsie Saas, RN, [facility] (listed on AMION under [facility])   [phone]

## 2020-03-11 NOTE — TOC Initial Note (Signed)
Transition of Care [facility]) - Initial/Assessment Note    Patient Details  Name: Leon Morris MRN: [id number] Date of Birth: 1922/09/29  Transition of Care [facility]) CM/SW Contact:    Kingsley Plan, RN Phone Number: 03/11/2020, 11:56 AM  Clinical Narrative:                  Spoke to patient's son [name] at bedside. Patient asleep.   Patient from home with his daughter [name]. [name] confirmed face sheet information and Teresa's contact information.   Provided Medicare.gov list of home hospice agencies. [name] would like [facility]. NCM offered to call Howard Pouch would like to be contact person at this time.    NCM explained hospice will not be in the home daily or for long periods of time. Discussed family could hire personnel care services if they needed help.   Patient will need hospital bed and 3 in1 at discharge.   NCM made referral to [facility] with Chales Abrahams. Chales Abrahams will review clinicals and call [name] directly.  Expected Discharge Plan: Home w Hospice Care Barriers to Discharge:  (referral to [facility] and DME delivery)   Patient Goals and CMS Choice Patient states their goals for this hospitalization and ongoing recovery are:: patient asleep CMS Medicare.gov Compare Post Acute Care list provided to:: Other (Comment Required) (Son [name] Fedor at bedside) Choice offered to / list presented to : Adult Children Occupational hygienist Gingerich son)  Expected Discharge Plan and Services Expected Discharge Plan: Home w Hospice Care   Discharge Planning Services: CM Consult Post Acute Care Choice: Hospice Living arrangements for the past 2 months: Single Family Home                             [facility] Agency: [facility] Date [facility] Agency Contacted: 03/11/20 Time HH Agency Contacted: 1155 Representative spoke with at [facility] Agency: Eben Burow  Prior Living Arrangements/Services Living arrangements for the past 2 months: Single Family  Home Lives with:: Adult Children Patient language and need for interpreter reviewed:: Yes Do you feel safe going back to the place where you live?: Yes      Need for Family Participation in Patient Care: Yes (Comment) Care giver support system in place?: Yes (comment)   Criminal Activity/Legal Involvement Pertinent to Current Situation/Hospitalization: No - Comment as needed  Activities of Daily Living      Permission Sought/Granted                  Emotional Assessment Appearance:: Appears stated age       Alcohol / Substance Use: Not Applicable Psych Involvement: No (comment)  Admission diagnosis:  Subdural hygroma [G96.08] Acute CVA (cerebrovascular accident) (HCC) [I63.9] Altered mental status, unspecified altered mental status type [R41.82] Community acquired pneumonia of left lower lobe of lung [J18.9] AMS (altered mental status) [R41.82] Patient Active Problem List   Diagnosis Date Noted  . AMS (altered mental status) 03/10/2020  . Falls frequently 03/10/2020  . Abnormal chest x-ray 03/10/2020  . Dysphagia 03/10/2020  . Stage 3b chronic kidney disease (HCC) 03/10/2020  . DNR (do not resuscitate) 03/10/2020  . Subdural hygroma 03/10/2020  . Acute CVA (cerebrovascular accident) (HCC) 03/10/2020  . Community acquired pneumonia of left lower lobe of lung   . Palliative care encounter   . Encounter for hospice care discussion   . Exudative age-related macular degeneration of right eye with inactive choroidal  neovascularization (HCC) 11/26/2019  . Intermediate stage nonexudative age-related macular degeneration of left eye 11/26/2019  . Retinal hemorrhage of right eye 11/26/2019  . Intermediate stage nonexudative age-related macular degeneration of both eyes 11/26/2019  . Pure hypercholesterolemia 09/06/2019  . Macular degeneration 09/06/2019  . Age-related osteoporosis with current pathological fracture 09/06/2019  . Anxiety 09/06/2019  . Mobitz type 2 second  degree heart block 11/09/2017  . Acetabular fracture (HCC) 11/13/2015  . Pelvic fracture (HCC) 11/13/2015  . Old MI (myocardial infarction) 10/29/2015  . Pseudoaneurysm (HCC) 07/02/2011  . Coronary artery disease 06/16/2011  . Hypertension 06/16/2011  . Hyperlipidemia 06/16/2011   PCP:  Kermit Balo, DO Pharmacy:   [facility] [phone] [place], [place] - [address] [place] [address] Phone: [phone] Fax: [phone]     Social Determinants of Health (SDOH) Interventions    Readmission Risk Interventions No flowsheet data found.

## 2020-03-11 NOTE — Progress Notes (Signed)
PROGRESS NOTE    Leon Morris [facility]  TOI:[id number] DOB: December 15, 1922 DOA: 03/10/2020 PCP: Kermit Balo, DO   Brief Narrative:  HPI: Leon Morris is a 86 y.o. male with medical history significant of CAD; HTN; and HLD presenting with AMS.  His daughter reports fall due to dizziness on Friday, may have hit his head.  He left without being seen after almost 8 hours.  Over the weekend, he has been extremely dizzy - has intermittent dizzy spells over the last few months, much milder and taking Meclizine for that.  His BP has been high and he has been extremely confused.  He doesn't remember things.  He was found on the floor this AM.  She doesn't know how he got on the floor and doesn't know if he hit his head.  He has not been having cough.  No apparent aspiration.  Increased belching with intermittent nausea.  No fever.  No apparent SOB.  He has been very fatigued.  "It's like someone flipped a switch" that made him confused.  His BP was 190/140 this AM which is markedly abnormal for him, as he tend to run low.  ED Course:  Dizziness and fall last Friday, left without being seen.  Increased confusion over the weekend.  Found down this AM.  Head CT with acute subdural hygroma on CT, neurosurgery doesn't see need for intervention.  Also has PNA?  Assessment & Plan:   Principal Problem:   AMS (altered mental status) Active Problems:   Hypertension   Hyperlipidemia   Falls frequently   Abnormal chest x-ray   Dysphagia   Stage 3b chronic kidney disease (HCC)   DNR (do not resuscitate)   Subdural hygroma   Acute CVA (cerebrovascular accident) (HCC)   Community acquired pneumonia of left lower lobe of lung   Palliative care encounter   Encounter for hospice care discussion  Falls/acute encephalopathy secondary to stroke/multiple acute cerebrovascular infarcts with hemorrhagic conversion: Patient upon my evaluation this morning was alert and oriented.  Following all commands.  His  encephalopathy has resolved.  Family has chosen for home with hospice.  Waiting for some equipment to be delivered.  Potential discharge home tomorrow.  Abnormal CXR/5 mm pulmonary nodules: -CXR was read as LLL infiltrate however CT chest does not show any infiltrate but it shows pulmonary nodules which are benign. He was given Rocephin and Azithromycin while in the ER.  No indication of antibiotics.  Hypertensive emergency: Patient presented with blood pressure of 190/86.  Has evidence of hemorrhagic conversion of cerebrovascular infarcts.  Recent blood pressure was 177/77.  Although family has chosen to go home with hospice but his son prefers to keep his blood pressure control in order to prevent worsening of his hemorrhages.  He is currently on only hydralazine 12.5 mg twice daily.  I have added 5 mg of amlodipine today.  Will place as needed hydralazine as well.  Dysphagia -Patient without report of current or past apparent dysphagia -His daughter denies coughing/choking/sputtering with eating/drinking -However, he failed his bedside swallow evaluation Seen by SLP and currently on dysphagia 2 diet.  HLD -Continue Crestor  Stage 3b CKD: At his baseline.  DVT prophylaxis:    Code Status: DNR  Family Communication:  None present at bedside.  Plan of care discussed with patient in length and he verbalized understanding and agreed with it.  Plan of care discussed with son over the phone.  Status is: Inpatient  Remains inpatient appropriate because:Inpatient level  of care appropriate due to severity of illness   Dispo: The patient is from: Home              Anticipated d/c is to: Home              Anticipated d/c date is: 1 day              Patient currently is not medically stable to d/c.        Estimated body mass index is 21.76 kg/m as calculated from the following:   Height as of this encounter: 5\' 4"  (1.626 m).   Weight as of this encounter: 57.5 kg.       Nutritional status:               Consultants:   PMT  Procedures:   None  Antimicrobials:  Anti-infectives (From admission, onward)   Start     Dose/Rate Route Frequency Ordered Stop   03/10/20 0930  cefTRIAXone (ROCEPHIN) 1 g in sodium chloride 0.9 % 100 mL IVPB        1 g 200 mL/hr over 30 Minutes Intravenous  Once 03/10/20 0926 03/10/20 1114   03/10/20 0930  azithromycin (ZITHROMAX) 500 mg in sodium chloride 0.9 % 250 mL IVPB        500 mg 250 mL/hr over 60 Minutes Intravenous  Once 03/10/20 0926 03/10/20 1224         Subjective: Seen and examined early morning.  Patient was alert and oriented and had no complaint.  Objective: Vitals:   03/10/20 1243 03/10/20 1413 03/10/20 1625 03/11/20 0510  BP: (!) 118/45 133/62 128/65 (!) 177/77  Pulse:  72 62 (!) 51  Resp:  16 14 16   Temp:   98 F (36.7 C) 98.8 F (37.1 C)  TempSrc:   Oral Oral  SpO2:  98% 96% 96%  Weight:      Height:        Intake/Output Summary (Last 24 hours) at 03/11/2020 1156 Last data filed at 03/10/2020 1606 Gross per 24 hour  Intake 450 ml  Output --  Net 450 ml   Filed Weights   03/10/20 0907  Weight: 57.5 kg    Examination:  General exam: Appears calm and comfortable  Respiratory system: Clear to auscultation. Respiratory effort normal. Cardiovascular system: S1 & S2 heard, RRR. No JVD, murmurs, rubs, gallops or clicks. No pedal edema. Gastrointestinal system: Abdomen is nondistended, soft and nontender. No organomegaly or masses felt. Normal bowel sounds heard. Central nervous system: Alert and oriented. No focal neurological deficits. Extremities: Symmetric 5 x 5 power. Skin: No rashes, lesions or ulcers    Data Reviewed: I have personally reviewed following labs and imaging studies  CBC: Recent Labs  Lab 03/07/20 1109 03/10/20 0737  WBC 8.8 17.2*  HGB 11.7* 12.0*  HCT 36.6* 35.8*  MCV 98.1 94.0  PLT 300 311   Basic Metabolic Panel: Recent Labs  Lab  03/07/20 1109 03/10/20 0737  NA 135 127*  K 5.3* 3.7  CL 101 90*  CO2 24 26  GLUCOSE 126* 131*  BUN 30* 29*  CREATININE 2.25* 1.76*  CALCIUM 9.2 9.0   GFR: Estimated Creatinine Clearance: 20.4 mL/min (A) (by C-G formula based on SCr of 1.76 mg/dL (H)). Liver Function Tests: Recent Labs  Lab 03/10/20 0737  AST 24  ALT 16  ALKPHOS 64  BILITOT 1.5*  PROT 6.6  ALBUMIN 3.6   No results for input(s): LIPASE, AMYLASE in the last  168 hours. No results for input(s): AMMONIA in the last 168 hours. Coagulation Profile: No results for input(s): INR, PROTIME in the last 168 hours. Cardiac Enzymes: No results for input(s): CKTOTAL, CKMB, CKMBINDEX, TROPONINI in the last 168 hours. BNP (last 3 results) No results for input(s): PROBNP in the last 8760 hours. HbA1C: No results for input(s): HGBA1C in the last 72 hours. CBG: Recent Labs  Lab 03/10/20 0940  GLUCAP 128*   Lipid Profile: No results for input(s): CHOL, HDL, LDLCALC, TRIG, CHOLHDL, LDLDIRECT in the last 72 hours. Thyroid Function Tests: Recent Labs    03/10/20 1230  TSH 2.739   Anemia Panel: No results for input(s): VITAMINB12, FOLATE, FERRITIN, TIBC, IRON, RETICCTPCT in the last 72 hours. Sepsis Labs: Recent Labs  Lab 03/10/20 1430  PROCALCITON <0.10    Recent Results (from the past 240 hour(s))  Respiratory Panel by RT PCR (Flu A&B, Covid) - Nasopharyngeal Swab     Status: None   Collection Time: 03/10/20  9:19 AM   Specimen: Nasopharyngeal Swab  Result Value Ref Range Status   SARS Coronavirus 2 by RT PCR NEGATIVE NEGATIVE Final    Comment: (NOTE) SARS-CoV-2 target nucleic acids are NOT DETECTED.  The SARS-CoV-2 RNA is generally detectable in upper respiratoy specimens during the acute phase of infection. The lowest concentration of SARS-CoV-2 viral copies this assay can detect is 131 copies/mL. A negative result does not preclude SARS-Cov-2 infection and should not be used as the sole basis for  treatment or other patient management decisions. A negative result may occur with  improper specimen collection/handling, submission of specimen other than nasopharyngeal swab, presence of viral mutation(s) within the areas targeted by this assay, and inadequate number of viral copies (<131 copies/mL). A negative result must be combined with clinical observations, patient history, and epidemiological information. The expected result is Negative.  Fact Sheet for Patients:  https://www.moore.com/  Fact Sheet for Healthcare Providers:  https://www.young.biz/  This test is no t yet approved or cleared by the [place] FDA and  has been authorized for detection and/or diagnosis of SARS-CoV-2 by FDA under an Emergency Use Authorization (EUA). This EUA will remain  in effect (meaning this test can be used) for the duration of the COVID-19 declaration under Section 564(b)(1) of the Act, 21 U.S.C. section 360bbb-3(b)(1), unless the authorization is terminated or revoked sooner.     Influenza A by PCR NEGATIVE NEGATIVE Final   Influenza B by PCR NEGATIVE NEGATIVE Final    Comment: (NOTE) The Xpert Xpress SARS-CoV-2/FLU/RSV assay is intended as an aid in  the diagnosis of influenza from Nasopharyngeal swab specimens and  should not be used as a sole basis for treatment. Nasal washings and  aspirates are unacceptable for Xpert Xpress SARS-CoV-2/FLU/RSV  testing.  Fact Sheet for Patients: https://www.moore.com/  Fact Sheet for Healthcare Providers: https://www.young.biz/  This test is not yet approved or cleared by the [place] FDA and  has been authorized for detection and/or diagnosis of SARS-CoV-2 by  FDA under an Emergency Use Authorization (EUA). This EUA will remain  in effect (meaning this test can be used) for the duration of the  Covid-19 declaration under Section 564(b)(1) of the Act, 21   U.S.C. section 360bbb-3(b)(1), unless the authorization is  terminated or revoked. Performed at [facility], [address] [place], [place] [address]       Radiology Studies: CT Head Wo Contrast  Result Date: 03/10/2020 CLINICAL DATA:  Syncopal episode.  Dizziness. EXAM:  CT HEAD WITHOUT CONTRAST TECHNIQUE: Contiguous axial images were obtained from the base of the skull through the vertex without intravenous contrast. COMPARISON:  02/18/2016 FINDINGS: Brain: There is a low-attenuation subdural fluid collection on the right side which has mild mass effect with slight compression of the frontal horn of the right lateral ventricle and mild right-to-left shift estimated at 4 mm. This is likely an acute subdural hygroma. Given the mild mass effect and shift I would recommend a follow-up CT scan in 24 hours. There also appear to be areas of remote subcortical infarcts surrounded by slightly high attenuation gyri. I suspect this is laminar necrosis with some mineralization. I do not think this is acute blood such as subarachnoid hemorrhage. I do not see any high attenuation fluid in the sulci. Stable age related cerebral atrophy, ventriculomegaly and periventricular white matter disease. The brainstem and cerebellum are grossly normal in stable. Vascular: Stable vascular calcifications. No aneurysm or hyperdense vessels. Skull: No skull fracture. Probable osteoma involving the left high parietal region is unchanged. Sinuses/Orbits: The paranasal sinuses and mastoid air cells are clear. The globes are intact. Other: No scalp laceration or hematoma. IMPRESSION: 1. Low-attenuation subdural fluid collection on the right side with mild mass effect and mild right-to-left shift estimated at 4 mm. This is likely an acute subdural hygroma. No hemorrhage is identified. 2. Remote subcortical infarcts in the right cerebral hemisphere with probable laminar necrosis and mineralization as detailed above. No findings  for acute hemispheric infarction. 3. Stable age related cerebral atrophy, ventriculomegaly and periventricular white matter disease. Electronically Signed   By: Rudie Meyer M.D.   On: 03/10/2020 10:03   CT CHEST WO CONTRAST  Result Date: 03/10/2020 CLINICAL DATA:  86 year old male with history of abnormal chest x-ray. Altered mental status. Multiple recent falls. EXAM: CT CHEST WITHOUT CONTRAST TECHNIQUE: Multidetector CT imaging of the chest was performed following the standard protocol without IV contrast. COMPARISON:  No prior chest CT.  Chest x-ray 03/10/2020. FINDINGS: Cardiovascular: Heart size is normal. There is no significant pericardial fluid, thickening or pericardial calcification. There is aortic atherosclerosis, as well as atherosclerosis of the great vessels of the mediastinum and the coronary arteries, including calcified atherosclerotic plaque in the left main, left anterior descending, left circumflex and right coronary arteries. Calcifications of the aortic valve and inferior aspect of the mitral annulus. Mediastinum/Nodes: No pathologically enlarged mediastinal or hilar lymph nodes. Please note that accurate exclusion of hilar adenopathy is limited on noncontrast CT scans. Esophagus is unremarkable in appearance. No axillary lymphadenopathy. Lungs/Pleura: Several small pulmonary nodules are noted in the lungs, largest of which is a subpleural nodule in the medial aspect of the right lower lobe (axial image 97 of series 6) measuring 6 x 4 mm (mean diameter of 5 mm). Scattered regions of cylindrical bronchiectasis are noted in the lower lungs bilaterally. No acute consolidative airspace disease. No pleural effusions. Mild pleural thickening in the posterior aspect of the medial left hemithorax. Upper Abdomen: Aortic atherosclerosis. Left kidney is not visualized and may be surgically absent. Small partially calcified gallstone measuring 7 mm lying dependently in the gallbladder.  Musculoskeletal: Chronic appearing compression fracture of T12 with 20% loss of anterior vertebral body height. There are no aggressive appearing lytic or blastic lesions noted in the visualized portions of the skeleton. IMPRESSION: 1. No acute findings are noted in the thorax. 2. Small amount of pleural thickening in the posteromedial aspect of the lower left hemithorax. 3. Tiny pulmonary nodules measuring 5 mm  or less in size, nonspecific, but statistically benign. No follow-up needed if patient is low-risk (and has no known or suspected primary neoplasm). Non-contrast chest CT can be considered in 12 months if patient is high-risk. This recommendation follows the consensus statement: Guidelines for Management of Incidental Pulmonary Nodules Detected on CT Images: From the Fleischner Society 2017; Radiology 2017; 284:228-243. 4. Aortic atherosclerosis, in addition to left main and 3 vessel coronary artery disease. 5. There are calcifications of the aortic valve and mitral annulus. Echocardiographic correlation for evaluation of potential valvular dysfunction may be warranted if clinically indicated. 6. Cholelithiasis. Aortic Atherosclerosis (ICD10-I70.0). Electronically Signed   By: Trudie Reed M.D.   On: 03/10/2020 14:47   MR ANGIO HEAD WO CONTRAST  Result Date: 03/10/2020 CLINICAL DATA:  Delirium. EXAM: MRI HEAD WITHOUT CONTRAST MRA HEAD WITHOUT CONTRAST TECHNIQUE: Multiplanar, multiecho pulse sequences of the brain and surrounding structures were obtained without intravenous contrast. Angiographic images of the head were obtained using MRA technique without contrast. COMPARISON:  CT head from the same day and MRI head from 01/25/2019 FINDINGS: MRI HEAD FINDINGS Brain: There are multiple small acute infarcts involving the right frontal and parietal cortex, as well as the anterolateral right temporal lobe. Minimal associated edema. These lesions demonstrate areas of cortical susceptibility artifact,  compatible with mild hemorrhage. There is suggestion of trace overlying cortical curvilinear susceptibility, suggestive of trace subarachnoid hemorrhage. There is a fluid intensity right cerebral convexity subdural collection which measures up to 6 mm in thickness (series 10, image 15). No T1 hyperintensity within the collection. No hydrocephalus. Scattered moderate T2/FLAIR hyperintensities within the white matter, compatible with chronic microvascular ischemic disease. Vascular: Major arterial flow voids are maintained proximally at the skull base. Possible stenosis of the left V4 vertebral artery; however, this is poorly evaluated given tortuosity of the vessel. Skull and upper cervical spine: Normal marrow signal. Degenerative changes of the upper cervical spine with retrolisthesis of C3 on C4 and disc bulge at this level resulting in likely moderate canal stenosis. Sinuses/Orbits: Mild scattered ethmoid air cell, sphenoid and frontal sinus mucosal thickening. No air-fluid levels. Other: No mastoid effusions. MRA HEAD FINDINGS Moderate stenosis of the right A1 ACA origin with distal opacification. Otherwise, no significant (greater than 50%) arterial stenosis or proximal occlusion. No aneurysm identified. Right dominant V4 vertebral artery. Tortuous left V4 vertebral artery. IMPRESSION: 1. Multiple small acute cortical infarcts involving the right frontal, parietal, and anterolateral right temporal lobes. Many of these infarcts demonstrate mild hemorrhage with possible trace overlying subarachnoid hemorrhage. Mild edema without substantial associated mass effect. 2. Findings consistent with a CSF intensity right subdural collection measuring up to 6 mm in thickness, as above. Mild (2-3 mm) of resulting leftward midline shift. 3. Moderate stenosis of the proximal right A1 ACA. Otherwise, no significant (greater than 50%) stenosis or proximal occlusion intracranially. 4. Degenerative changes of the upper cervical  spine with retrolisthesis of C3 on C4 and disc bulge at this level resulting in likely moderate canal stenosis. MRI of the cervical spine could further characterize if clinically indicated. 5. Possible stenosis of the left V4 vertebral artery; however, this is poorly evaluated given tortuosity of the vessel. A CTA of the neck could better evaluate if clinically indicated. Electronically Signed   By: Feliberto Harts MD   On: 03/10/2020 14:05   MR BRAIN WO CONTRAST  Result Date: 03/10/2020 CLINICAL DATA:  Delirium. EXAM: MRI HEAD WITHOUT CONTRAST MRA HEAD WITHOUT CONTRAST TECHNIQUE: Multiplanar, multiecho pulse sequences of the  brain and surrounding structures were obtained without intravenous contrast. Angiographic images of the head were obtained using MRA technique without contrast. COMPARISON:  CT head from the same day and MRI head from 01/25/2019 FINDINGS: MRI HEAD FINDINGS Brain: There are multiple small acute infarcts involving the right frontal and parietal cortex, as well as the anterolateral right temporal lobe. Minimal associated edema. These lesions demonstrate areas of cortical susceptibility artifact, compatible with mild hemorrhage. There is suggestion of trace overlying cortical curvilinear susceptibility, suggestive of trace subarachnoid hemorrhage. There is a fluid intensity right cerebral convexity subdural collection which measures up to 6 mm in thickness (series 10, image 15). No T1 hyperintensity within the collection. No hydrocephalus. Scattered moderate T2/FLAIR hyperintensities within the white matter, compatible with chronic microvascular ischemic disease. Vascular: Major arterial flow voids are maintained proximally at the skull base. Possible stenosis of the left V4 vertebral artery; however, this is poorly evaluated given tortuosity of the vessel. Skull and upper cervical spine: Normal marrow signal. Degenerative changes of the upper cervical spine with retrolisthesis of C3 on C4  and disc bulge at this level resulting in likely moderate canal stenosis. Sinuses/Orbits: Mild scattered ethmoid air cell, sphenoid and frontal sinus mucosal thickening. No air-fluid levels. Other: No mastoid effusions. MRA HEAD FINDINGS Moderate stenosis of the right A1 ACA origin with distal opacification. Otherwise, no significant (greater than 50%) arterial stenosis or proximal occlusion. No aneurysm identified. Right dominant V4 vertebral artery. Tortuous left V4 vertebral artery. IMPRESSION: 1. Multiple small acute cortical infarcts involving the right frontal, parietal, and anterolateral right temporal lobes. Many of these infarcts demonstrate mild hemorrhage with possible trace overlying subarachnoid hemorrhage. Mild edema without substantial associated mass effect. 2. Findings consistent with a CSF intensity right subdural collection measuring up to 6 mm in thickness, as above. Mild (2-3 mm) of resulting leftward midline shift. 3. Moderate stenosis of the proximal right A1 ACA. Otherwise, no significant (greater than 50%) stenosis or proximal occlusion intracranially. 4. Degenerative changes of the upper cervical spine with retrolisthesis of C3 on C4 and disc bulge at this level resulting in likely moderate canal stenosis. MRI of the cervical spine could further characterize if clinically indicated. 5. Possible stenosis of the left V4 vertebral artery; however, this is poorly evaluated given tortuosity of the vessel. A CTA of the neck could better evaluate if clinically indicated. Electronically Signed   By: Feliberto Harts MD   On: 03/10/2020 14:05   DG Chest Port 1 View  Result Date: 03/10/2020 CLINICAL DATA:  Confusion, dizziness, fall, leukocytosis. EXAM: PORTABLE CHEST 1 VIEW COMPARISON:  03/07/2020 FINDINGS: New left basilar consolidation with silhouetting of the left diaphragm. No right pleural effusion. Limited evaluation for left pleural effusion given left basilar opacity. No pneumothorax.  Cardiac silhouette is upper limits of normal, similar to prior. Coronary artery stent. Mediastinal contour is similar over multiple priors dating back to 2013. Thoracic dextrocurvature. Polyarticular degenerative change with osteopenia. IMPRESSION: 1. New left basilar consolidation, concerning for aspiration and/or pneumonia. 2. Borderline cardiomegaly, similar to prior. Electronically Signed   By: Feliberto Harts MD   On: 03/10/2020 09:18    Scheduled Meds: . amLODipine  5 mg Oral Daily  . citalopram  40 mg Oral Daily  . feeding supplement (ENSURE ENLIVE)  237 mL Oral BID BM  . hydrALAZINE  12.5 mg Oral BID   Continuous Infusions:   LOS: 1 day   Time spent: 37 minutes   Hughie Closs, MD [facility]  03/11/2020, 11:56  AM   To contact the attending provider between 7A-7P or the covering provider during after hours 7P-7A, please log into the web site [email or web address].

## 2020-03-11 NOTE — Progress Notes (Signed)
Daily Progress Note   Patient Name: Leon Morris       Date: 03/11/2020 DOB: Mar 09, 1923  Age: 86 y.o. MRN#: [id number] Attending Physician: Hughie Closs, MD Primary Care Physician: Kermit Balo, DO Admit Date: 03/10/2020  Reason for Consultation/Follow-up: Establishing goals of care  Subjective: Patient awake, alert, interactive this morning. Hard of hearing. Denies pain or discomfort. Accepted sips of Ensure from son.   GOC:  F/u GOC with patient's son, Orpah Greek in family conference room. [name] has discussed with his sister Aggie Cosier and understands diagnoses and poor long-term prognosis. Discussed diagnoses, interventions, MRI results, and goals of care. [name] asks about difference between hospice and palliative services. Education provided. [name] and his sister are interested in their father returning home with support of hospice services. Discussed hospice options and philosophy. Likely will hire additional caregiver support. Emphasized focus on comfort, quality of life, symptom management, and preventing hospitalization when his condition deteriorates. Also discussed transfer from home to hospice home if necessary. Answered questions and concerns. Explained TOC referral. PMT contact information given.   Length of Stay: 1  Current Medications: Scheduled Meds:  . citalopram  40 mg Oral Daily  . feeding supplement (ENSURE ENLIVE)  237 mL Oral BID BM  . hydrALAZINE  12.5 mg Oral BID    Continuous Infusions:   PRN Meds: acetaminophen **OR** acetaminophen, antiseptic oral rinse, atropine, diphenhydrAMINE, [DISCONTINUED] haloperidol **OR** [DISCONTINUED] haloperidol **OR** haloperidol lactate, HYDROmorphone (DILAUDID) injection, [DISCONTINUED] LORazepam **OR** [DISCONTINUED] LORazepam  **OR** LORazepam, LORazepam, meclizine, ondansetron **OR** ondansetron (ZOFRAN) IV, polyvinyl alcohol  Physical Exam Vitals and nursing note reviewed.  Constitutional:      Appearance: He is ill-appearing.  HENT:     Head: Normocephalic and atraumatic.  Pulmonary:     Effort: No tachypnea, accessory muscle usage or respiratory distress.  Skin:    General: Skin is warm and dry.  Neurological:     Mental Status: He is easily aroused.     Comments: Awake, alert and interactive             Vital Signs: BP (!) 177/77 (BP Location: Right Arm)   Pulse (!) 51   Temp 98.8 F (37.1 C) (Oral)   Resp 16   Ht 5\' 4"  (1.626 m)   Wt 57.5 kg   SpO2 96%   BMI  21.76 kg/m  SpO2: SpO2: 96 % O2 Device: O2 Device: Room Air O2 Flow Rate:    Intake/output summary:   Intake/Output Summary (Last 24 hours) at 03/11/2020 1125 Last data filed at 03/10/2020 1606 Gross per 24 hour  Intake 450 ml  Output --  Net 450 ml   LBM:   Baseline Weight: Weight: 57.5 kg Most recent weight: Weight: 57.5 kg       Palliative Assessment/Data: PPS 30%      Patient Active Problem List   Diagnosis Date Noted  . AMS (altered mental status) 03/10/2020  . Falls frequently 03/10/2020  . Abnormal chest x-ray 03/10/2020  . Dysphagia 03/10/2020  . Stage 3b chronic kidney disease (HCC) 03/10/2020  . DNR (do not resuscitate) 03/10/2020  . Subdural hygroma 03/10/2020  . Acute CVA (cerebrovascular accident) (HCC) 03/10/2020  . Community acquired pneumonia of left lower lobe of lung   . Palliative care encounter   . Encounter for hospice care discussion   . Exudative age-related macular degeneration of right eye with inactive choroidal neovascularization (HCC) 11/26/2019  . Intermediate stage nonexudative age-related macular degeneration of left eye 11/26/2019  . Retinal hemorrhage of right eye 11/26/2019  . Intermediate stage nonexudative age-related macular degeneration of both eyes 11/26/2019  . Pure  hypercholesterolemia 09/06/2019  . Macular degeneration 09/06/2019  . Age-related osteoporosis with current pathological fracture 09/06/2019  . Anxiety 09/06/2019  . Mobitz type 2 second degree heart block 11/09/2017  . Acetabular fracture (HCC) 11/13/2015  . Pelvic fracture (HCC) 11/13/2015  . Old MI (myocardial infarction) 10/29/2015  . Pseudoaneurysm (HCC) 07/02/2011  . Coronary artery disease 06/16/2011  . Hypertension 06/16/2011  . Hyperlipidemia 06/16/2011    Palliative Care Assessment & Plan   Patient Profile: 86 y.o. male  with past medical history of mobitz type 2, CAD, and episodic dizziness who was admitted on 03/10/2020 after being found on the floor this morning next to his bed.  Per notes he fell last Friday and has had increased confusion over the weekend.  Today he has had increased nausea and fatigue.  His blood pressure has been very high (224/92) when it is normally low.  CXR shows LLL consolidation concerning for aspiration pneumonia.  He failed a swallow study in the ER.  MR Brain shows multiple small strokes in the right frontal, parietal, and temporal lobes - some with hemorrhagic conversion.  He has a slight midline shift.  [facility] admitting physician discussed comfort measures with his daughter [name] and she was agreeable.   Assessment: Falls  Acute encephalopathy Multiple acute infarcts with hemorrhagic conversion Hypertensive emergency Dysphagia Aspiration risk Pulmonary nodules  Recommendations/Plan: Comfort focused care plan. Allow unrestricted visitor access as patient is at risk for acute decompensation at any time with acute infarcts and hemorrhagic conversion. Comfort meds on MAR. Comfort feeds per patient/family request. Aspiration precautions.  TOC referral for home hospice services. Will need DME.   Code Status: DNR/DNI   Code Status Orders  (From admission, onward)         Start     Ordered   03/10/20 1538  Do not attempt resuscitation (DNR)   Continuous       Question Answer Comment  In the event of cardiac or respiratory ARREST Do not call a "code blue"   In the event of cardiac or respiratory ARREST Do not perform Intubation, CPR, defibrillation or ACLS   In the event of cardiac or respiratory ARREST Use medication by any route, position, wound care,  and other measures to relive pain and suffering. May use oxygen, suction and manual treatment of airway obstruction as needed for comfort.      03/10/20 1539        Code Status History    Date Active Date Inactive Code Status Order ID Comments User Context   03/10/2020 1208 03/10/2020 1540 DNR [id number]  Jonah Blue, MD ED   11/13/2015 1730 11/15/2015 1755 Full Code [id number]  Colon Branch, MD ED   06/12/2011 1415 06/16/2011 1851 Full Code [id number]  Danne Harbor, RN ED   Advance Care Planning Activity      Prognosis: Poor long-term  Discharge Planning: Home with Hospice  Care plan was discussed with RN, son, Dr. Jacqulyn Bath  Thank you for allowing the Palliative Medicine Team to assist in the care of this patient.   Total Time 40 Prolonged Time Billed  no      Greater than 50%  of this time was spent counseling and coordinating care related to the above assessment and plan.  Vennie Homans, DNP, FNP-C Palliative Medicine Team  Phone: [phone] Fax: [phone]  Please contact Palliative Medicine Team phone at [phone] for questions and concerns.

## 2020-03-11 NOTE — Telephone Encounter (Signed)
Yes, I will be attending for him on hospice.  It looks like he's had several strokes and a subdural hematoma that suggest his prognosis is less than 6 months, unfortunately.

## 2020-03-11 NOTE — Telephone Encounter (Signed)
Crystay from [facility] hospice would like to ask if you would be the attendee and agree the Leon Morris has a terminal illness. He is being released from the hospital tomorrow.  Please advise?

## 2020-03-12 DIAGNOSIS — R410 Disorientation, unspecified: Secondary | ICD-10-CM | POA: Diagnosis not present

## 2020-03-12 MED ORDER — MECLIZINE HCL 12.5 MG PO TABS
12.5000 mg | ORAL_TABLET | Freq: Three times a day (TID) | ORAL | Status: DC
  Administered 2020-03-12 – 2020-03-17 (×14): 12.5 mg via ORAL
  Filled 2020-03-12 (×17): qty 1

## 2020-03-12 MED ORDER — POLYVINYL ALCOHOL 1.4 % OP SOLN: 1.0000 [drp] | Freq: Four times a day (QID) | OPHTHALMIC | 0 refills | Status: AC | PRN

## 2020-03-12 MED ORDER — AMLODIPINE BESYLATE 5 MG PO TABS
5.0000 mg | ORAL_TABLET | Freq: Every day | ORAL | 2 refills | Status: DC
Start: 2020-03-13 — End: 2020-03-17

## 2020-03-12 MED ORDER — ONDANSETRON HCL 4 MG/2ML IJ SOLN
4.0000 mg | Freq: Three times a day (TID) | INTRAMUSCULAR | Status: DC
  Administered 2020-03-13 – 2020-03-16 (×4): 4 mg via INTRAVENOUS
  Filled 2020-03-12 (×7): qty 2

## 2020-03-12 MED ORDER — ATROPINE SULFATE 1 % OP SOLN: 2.0000 [drp] | Freq: Four times a day (QID) | OPHTHALMIC | 12 refills | Status: AC | PRN

## 2020-03-12 MED ORDER — ONDANSETRON 4 MG PO TBDP: 4.0000 mg | ORAL_TABLET | Freq: Four times a day (QID) | ORAL | 0 refills | Status: AC | PRN

## 2020-03-12 MED ORDER — ONDANSETRON 4 MG PO TBDP
4.0000 mg | ORAL_TABLET | Freq: Three times a day (TID) | ORAL | Status: DC
  Administered 2020-03-12 – 2020-03-17 (×10): 4 mg via ORAL
  Filled 2020-03-12 (×10): qty 1

## 2020-03-12 MED ORDER — HYDRALAZINE HCL 25 MG PO TABS: 12.5000 mg | ORAL_TABLET | Freq: Two times a day (BID) | ORAL | 1 refills | Status: AC

## 2020-03-12 NOTE — TOC Progression Note (Addendum)
Transition of Care [facility]) - Progression Note    Patient Details  Name: Leon Morris MRN: [id number] Date of Birth: 28-Aug-1922  Transition of Care [facility]) CM/SW Contact  Nadene Rubins Adria Devon, RN Phone Number: 03/12/2020, 11:54 AM  Clinical Narrative:     Sherron Monday to Crystlyn with [facility]. They tried to arrange delivery of DME to the home, family requested delivery tomorrow morning    Spoke to patient's son [name] via phone. He would like to speak to his father's doctor today regarding BP .   [name] was told by [employer] DME would be delivered tomorrow, he did not chose tomorrow. Chrislyn with [facility] checking on DME    MD spoke to patient's son Orpah Greek and answered questions.   NCM called daughter [name] . [name] upset with discharge today. She is concerned about her father's BP, explained MD called her brother and discussed same.   [name] explained [employer] called for delivery and she was told hospital bed may not be delivered until late tonight or available until tomorrow morning. Also they are arranging caregivers and will not have caregivers in place tonight.   NCM will call [facility] regarding delivery of DME ( hospital bed) . NCM will ask [facility] to call daughter.   Called [name] and updated him. He has been trying to arrange personal care services and there is a waiting period.  Expected Discharge Plan: Home w Hospice Care Barriers to Discharge:  (referral to [facility] and DME delivery)  Expected Discharge Plan and Services Expected Discharge Plan: Home w Hospice Care   Discharge Planning Services: CM Consult Post Acute Care Choice: Hospice Living arrangements for the past 2 months: Single Family Home                             [facility] Agency: [facility] Date [facility] Agency Contacted: 03/11/20 Time HH Agency Contacted: 1155 Representative spoke with at [facility] Agency: Eben Burow   Social Determinants of Health (SDOH)  Interventions    Readmission Risk Interventions No flowsheet data found.

## 2020-03-12 NOTE — Progress Notes (Signed)
[facility])  Chart and pt information have been reviewed by [facility] physician.  Pt is hospice eligible.   Pease send signed and completed DNR home with pt/family.  Please provide prescriptions at discharge as needed to ensure ongoing symptom management until pt can be admitted onto hospice Services.    Spoke with [name] re DME to verify delivery.  Per [name] DME is scheduled for delivery tomorrow morning.  Questions answered about paid caregivers in the home.  Per [name] he is in the process of getting this set up.  [facility] information and contact numbers given to [name].  Above information shared with Edmund Hilda Manager.  Please call with any questions or concerns.  Thank you for the opportunity to participate in this pt's care.  Gillian Scarce, BSN, RN [employer] [phone] (24h on call)

## 2020-03-12 NOTE — Discharge Summary (Addendum)
Physician Discharge Summary  Leon Morris [facility] WUJ:[id number] DOB: [birth year] DOA: 03/10/2020  PCP: Kermit Balo, DO  Admit date: 03/10/2020 Discharge date:03/17/20 Admitted From: Home Disposition: Home Recommendations for Outpatient Follow-up: Patient is a hospice discharge  Home Health none Equipment/Devices: None  Discharge Condition: Hospice CODE STATUS: DO NOT RESUSCITATE Diet recommendation: Cardiac Brief/Interim Summary:Leon J Showfetyis a 86 y.o.malewith medical history significant ofCAD; HTN; and HLD presenting with AMS.His daughter reports fall due to dizziness on Friday, may have hit his head. He left without being seen after almost 8 hours. Over the weekend, he has been extremely dizzy - has intermittent dizzy spells over the last few months, much milder and taking Meclizine for that. His BP has been high and he has been extremely confused. He doesn't remember things. He was found on the floor this AM. She doesn't know how he got on the floor and doesn't know if he hit his head. He has not been having cough. No apparent aspiration. Increased belching with intermittent nausea.No fever. No apparent SOB. He has been very fatigued. "It's like someone flipped a switch" that made him confused. His BP was 190/140 this AM which is markedly abnormal for him, as he tend to run low.  Discharge Diagnoses:  Principal Problem:   AMS (altered mental status) Active Problems:   Hypertension   Hyperlipidemia   Falls frequently   Abnormal chest x-ray   Dysphagia   Stage 3b chronic kidney disease (HCC)   DNR (do not resuscitate)   Subdural hygroma   Acute CVA (cerebrovascular accident) (HCC)   Community acquired pneumonia of left lower lobe of lung   Palliative care encounter   Terminal care   Falls/acute encephalopathy secondary to stroke/multiple acute cerebrovascular infarcts with hemorrhagic conversion: Patient upon my evaluation this morning was alert and  oriented.  Following all commands.  His encephalopathy has resolved.  Family has chosen for home with hospice.  Equipment to be delivered today.  We will discharge patient home today.   Abnormal CXR/5 mm pulmonary nodules: -CXR was read as LLL infiltrate however CT chest does not show any infiltrate but it shows pulmonary nodules which are benign. He was given Rocephin and Azithromycin while in the ER.  No indication of antibiotics.  Hypertensive emergency: Patient presented with blood pressure of 190/86.  Has evidence of hemorrhagic conversion of cerebrovascular infarcts.  Recent blood pressure was 177/77.  Although family has chosen to go home with hospice but his son prefers to keep his blood pressure control in order to prevent worsening of his hemorrhages.  Continue hydralazine 12.5 mg twice a day and Norvasc 5 mg daily.  Dysphagia -Patient without report of current or past apparent dysphagia -His daughter denies coughing/choking/sputtering with eating/drinking -However, he failed his bedside swallow evaluation Seen by SLP and currently on dysphagia 2 diet.  HLD -Continue Crestor  Stage 3b CKD: At his baseline  Estimated body mass index is 21.76 kg/m as calculated from the following:   Height as of this encounter: 5\' 4"  (1.626 m).   Weight as of this encounter: 57.5 kg.  Discharge Instructions  Discharge Instructions    Diet - low sodium heart healthy   Complete by: As directed    Increase activity slowly   Complete by: As directed      Allergies as of 03/12/2020   No Known Allergies     Medication List    STOP taking these medications   acetaminophen 500 MG tablet Commonly known as: TYLENOL  TAKE these medications   amLODipine 5 MG tablet Commonly known as: NORVASC Take 1 tablet (5 mg total) by mouth daily. Start taking on: March 13, 2020   atropine 1 % ophthalmic solution Place 2 drops under the tongue 4 (four) times daily as needed (secretions).    citalopram 40 MG tablet Commonly known as: CELEXA TAKE 1 TABLET BY MOUTH EVERY DAY   hydrALAZINE 25 MG tablet Commonly known as: APRESOLINE Take 0.5 tablets (12.5 mg total) by mouth 2 (two) times daily. What changed: when to take this   meclizine 12.5 MG tablet Commonly known as: ANTIVERT Take 12.5 mg by mouth 2 (two) times daily as needed for dizziness.   ondansetron 4 MG disintegrating tablet Commonly known as: ZOFRAN-ODT Take 1 tablet (4 mg total) by mouth every 6 (six) hours as needed for nausea.   polyvinyl alcohol 1.4 % ophthalmic solution Commonly known as: LIQUIFILM TEARS Place 1 drop into both eyes 4 (four) times daily as needed for dry eyes.   rosuvastatin 5 MG tablet Commonly known as: CRESTOR Take 1 tablet (5 mg total) by mouth daily.   Vitamin D (Ergocalciferol) 1.25 MG (50000 UNIT) Caps capsule Commonly known as: DRISDOL Take 50,000 Units by mouth once a week.       No Known Allergies  Consultations: Palliative care  Procedures/Studies: DG Chest 2 View  Result Date: 03/07/2020 CLINICAL DATA:  Chest pain and syncope EXAM: CHEST - 2 VIEW COMPARISON:  11/13/2015 FINDINGS: Chronic cardiomegaly. Coronary stenting is noted. Low volume chest with mild scarring at the bases. There is no edema, consolidation, effusion, or pneumothorax. Prominent osteopenia. Scoliosis. IMPRESSION: No acute finding when compared to prior. Electronically Signed   By: Marnee Spring M.D.   On: 03/07/2020 11:37   CT Head Wo Contrast  Result Date: 03/10/2020 CLINICAL DATA:  Syncopal episode.  Dizziness. EXAM: CT HEAD WITHOUT CONTRAST TECHNIQUE: Contiguous axial images were obtained from the base of the skull through the vertex without intravenous contrast. COMPARISON:  02/18/2016 FINDINGS: Brain: There is a low-attenuation subdural fluid collection on the right side which has mild mass effect with slight compression of the frontal horn of the right lateral ventricle and mild  right-to-left shift estimated at 4 mm. This is likely an acute subdural hygroma. Given the mild mass effect and shift I would recommend a follow-up CT scan in 24 hours. There also appear to be areas of remote subcortical infarcts surrounded by slightly high attenuation gyri. I suspect this is laminar necrosis with some mineralization. I do not think this is acute blood such as subarachnoid hemorrhage. I do not see any high attenuation fluid in the sulci. Stable age related cerebral atrophy, ventriculomegaly and periventricular white matter disease. The brainstem and cerebellum are grossly normal in stable. Vascular: Stable vascular calcifications. No aneurysm or hyperdense vessels. Skull: No skull fracture. Probable osteoma involving the left high parietal region is unchanged. Sinuses/Orbits: The paranasal sinuses and mastoid air cells are clear. The globes are intact. Other: No scalp laceration or hematoma. IMPRESSION: 1. Low-attenuation subdural fluid collection on the right side with mild mass effect and mild right-to-left shift estimated at 4 mm. This is likely an acute subdural hygroma. No hemorrhage is identified. 2. Remote subcortical infarcts in the right cerebral hemisphere with probable laminar necrosis and mineralization as detailed above. No findings for acute hemispheric infarction. 3. Stable age related cerebral atrophy, ventriculomegaly and periventricular white matter disease. Electronically Signed   By: Rudie Meyer M.D.   On: 03/10/2020  10:03   CT CHEST WO CONTRAST  Result Date: 03/10/2020 CLINICAL DATA:  86 year old male with history of abnormal chest x-ray. Altered mental status. Multiple recent falls. EXAM: CT CHEST WITHOUT CONTRAST TECHNIQUE: Multidetector CT imaging of the chest was performed following the standard protocol without IV contrast. COMPARISON:  No prior chest CT.  Chest x-ray 03/10/2020. FINDINGS: Cardiovascular: Heart size is normal. There is no significant pericardial  fluid, thickening or pericardial calcification. There is aortic atherosclerosis, as well as atherosclerosis of the great vessels of the mediastinum and the coronary arteries, including calcified atherosclerotic plaque in the left main, left anterior descending, left circumflex and right coronary arteries. Calcifications of the aortic valve and inferior aspect of the mitral annulus. Mediastinum/Nodes: No pathologically enlarged mediastinal or hilar lymph nodes. Please note that accurate exclusion of hilar adenopathy is limited on noncontrast CT scans. Esophagus is unremarkable in appearance. No axillary lymphadenopathy. Lungs/Pleura: Several small pulmonary nodules are noted in the lungs, largest of which is a subpleural nodule in the medial aspect of the right lower lobe (axial image 97 of series 6) measuring 6 x 4 mm (mean diameter of 5 mm). Scattered regions of cylindrical bronchiectasis are noted in the lower lungs bilaterally. No acute consolidative airspace disease. No pleural effusions. Mild pleural thickening in the posterior aspect of the medial left hemithorax. Upper Abdomen: Aortic atherosclerosis. Left kidney is not visualized and may be surgically absent. Small partially calcified gallstone measuring 7 mm lying dependently in the gallbladder. Musculoskeletal: Chronic appearing compression fracture of T12 with 20% loss of anterior vertebral body height. There are no aggressive appearing lytic or blastic lesions noted in the visualized portions of the skeleton. IMPRESSION: 1. No acute findings are noted in the thorax. 2. Small amount of pleural thickening in the posteromedial aspect of the lower left hemithorax. 3. Tiny pulmonary nodules measuring 5 mm or less in size, nonspecific, but statistically benign. No follow-up needed if patient is low-risk (and has no known or suspected primary neoplasm). Non-contrast chest CT can be considered in 12 months if patient is high-risk. This recommendation follows the  consensus statement: Guidelines for Management of Incidental Pulmonary Nodules Detected on CT Images: From the Fleischner Society 2017; Radiology 2017; 284:228-243. 4. Aortic atherosclerosis, in addition to left main and 3 vessel coronary artery disease. 5. There are calcifications of the aortic valve and mitral annulus. Echocardiographic correlation for evaluation of potential valvular dysfunction may be warranted if clinically indicated. 6. Cholelithiasis. Aortic Atherosclerosis (ICD10-I70.0). Electronically Signed   By: Trudie Reed M.D.   On: 03/10/2020 14:47   MR ANGIO HEAD WO CONTRAST  Result Date: 03/10/2020 CLINICAL DATA:  Delirium. EXAM: MRI HEAD WITHOUT CONTRAST MRA HEAD WITHOUT CONTRAST TECHNIQUE: Multiplanar, multiecho pulse sequences of the brain and surrounding structures were obtained without intravenous contrast. Angiographic images of the head were obtained using MRA technique without contrast. COMPARISON:  CT head from the same day and MRI head from 01/25/2019 FINDINGS: MRI HEAD FINDINGS Brain: There are multiple small acute infarcts involving the right frontal and parietal cortex, as well as the anterolateral right temporal lobe. Minimal associated edema. These lesions demonstrate areas of cortical susceptibility artifact, compatible with mild hemorrhage. There is suggestion of trace overlying cortical curvilinear susceptibility, suggestive of trace subarachnoid hemorrhage. There is a fluid intensity right cerebral convexity subdural collection which measures up to 6 mm in thickness (series 10, image 15). No T1 hyperintensity within the collection. No hydrocephalus. Scattered moderate T2/FLAIR hyperintensities within the white matter, compatible with chronic  microvascular ischemic disease. Vascular: Major arterial flow voids are maintained proximally at the skull base. Possible stenosis of the left V4 vertebral artery; however, this is poorly evaluated given tortuosity of the vessel. Skull  and upper cervical spine: Normal marrow signal. Degenerative changes of the upper cervical spine with retrolisthesis of C3 on C4 and disc bulge at this level resulting in likely moderate canal stenosis. Sinuses/Orbits: Mild scattered ethmoid air cell, sphenoid and frontal sinus mucosal thickening. No air-fluid levels. Other: No mastoid effusions. MRA HEAD FINDINGS Moderate stenosis of the right A1 ACA origin with distal opacification. Otherwise, no significant (greater than 50%) arterial stenosis or proximal occlusion. No aneurysm identified. Right dominant V4 vertebral artery. Tortuous left V4 vertebral artery. IMPRESSION: 1. Multiple small acute cortical infarcts involving the right frontal, parietal, and anterolateral right temporal lobes. Many of these infarcts demonstrate mild hemorrhage with possible trace overlying subarachnoid hemorrhage. Mild edema without substantial associated mass effect. 2. Findings consistent with a CSF intensity right subdural collection measuring up to 6 mm in thickness, as above. Mild (2-3 mm) of resulting leftward midline shift. 3. Moderate stenosis of the proximal right A1 ACA. Otherwise, no significant (greater than 50%) stenosis or proximal occlusion intracranially. 4. Degenerative changes of the upper cervical spine with retrolisthesis of C3 on C4 and disc bulge at this level resulting in likely moderate canal stenosis. MRI of the cervical spine could further characterize if clinically indicated. 5. Possible stenosis of the left V4 vertebral artery; however, this is poorly evaluated given tortuosity of the vessel. A CTA of the neck could better evaluate if clinically indicated. Electronically Signed   By: Feliberto Harts MD   On: 03/10/2020 14:05   MR BRAIN WO CONTRAST  Result Date: 03/10/2020 CLINICAL DATA:  Delirium. EXAM: MRI HEAD WITHOUT CONTRAST MRA HEAD WITHOUT CONTRAST TECHNIQUE: Multiplanar, multiecho pulse sequences of the brain and surrounding structures were  obtained without intravenous contrast. Angiographic images of the head were obtained using MRA technique without contrast. COMPARISON:  CT head from the same day and MRI head from 01/25/2019 FINDINGS: MRI HEAD FINDINGS Brain: There are multiple small acute infarcts involving the right frontal and parietal cortex, as well as the anterolateral right temporal lobe. Minimal associated edema. These lesions demonstrate areas of cortical susceptibility artifact, compatible with mild hemorrhage. There is suggestion of trace overlying cortical curvilinear susceptibility, suggestive of trace subarachnoid hemorrhage. There is a fluid intensity right cerebral convexity subdural collection which measures up to 6 mm in thickness (series 10, image 15). No T1 hyperintensity within the collection. No hydrocephalus. Scattered moderate T2/FLAIR hyperintensities within the white matter, compatible with chronic microvascular ischemic disease. Vascular: Major arterial flow voids are maintained proximally at the skull base. Possible stenosis of the left V4 vertebral artery; however, this is poorly evaluated given tortuosity of the vessel. Skull and upper cervical spine: Normal marrow signal. Degenerative changes of the upper cervical spine with retrolisthesis of C3 on C4 and disc bulge at this level resulting in likely moderate canal stenosis. Sinuses/Orbits: Mild scattered ethmoid air cell, sphenoid and frontal sinus mucosal thickening. No air-fluid levels. Other: No mastoid effusions. MRA HEAD FINDINGS Moderate stenosis of the right A1 ACA origin with distal opacification. Otherwise, no significant (greater than 50%) arterial stenosis or proximal occlusion. No aneurysm identified. Right dominant V4 vertebral artery. Tortuous left V4 vertebral artery. IMPRESSION: 1. Multiple small acute cortical infarcts involving the right frontal, parietal, and anterolateral right temporal lobes. Many of these infarcts demonstrate mild hemorrhage with  possible  trace overlying subarachnoid hemorrhage. Mild edema without substantial associated mass effect. 2. Findings consistent with a CSF intensity right subdural collection measuring up to 6 mm in thickness, as above. Mild (2-3 mm) of resulting leftward midline shift. 3. Moderate stenosis of the proximal right A1 ACA. Otherwise, no significant (greater than 50%) stenosis or proximal occlusion intracranially. 4. Degenerative changes of the upper cervical spine with retrolisthesis of C3 on C4 and disc bulge at this level resulting in likely moderate canal stenosis. MRI of the cervical spine could further characterize if clinically indicated. 5. Possible stenosis of the left V4 vertebral artery; however, this is poorly evaluated given tortuosity of the vessel. A CTA of the neck could better evaluate if clinically indicated. Electronically Signed   By: Feliberto Harts MD   On: 03/10/2020 14:05   DG Chest Port 1 View  Result Date: 03/10/2020 CLINICAL DATA:  Confusion, dizziness, fall, leukocytosis. EXAM: PORTABLE CHEST 1 VIEW COMPARISON:  03/07/2020 FINDINGS: New left basilar consolidation with silhouetting of the left diaphragm. No right pleural effusion. Limited evaluation for left pleural effusion given left basilar opacity. No pneumothorax. Cardiac silhouette is upper limits of normal, similar to prior. Coronary artery stent. Mediastinal contour is similar over multiple priors dating back to 2013. Thoracic dextrocurvature. Polyarticular degenerative change with osteopenia. IMPRESSION: 1. New left basilar consolidation, concerning for aspiration and/or pneumonia. 2. Borderline cardiomegaly, similar to prior. Electronically Signed   By: Feliberto Harts MD   On: 03/10/2020 09:18    (Echo, Carotid, EGD, Colonoscopy, ERCP)    Subjective: Patient resting in bed he is awake and alert he thinks he is going home today  Discharge Exam: Vitals:   03/12/20 0451 03/12/20 1127  BP: (!) 158/61 (!) 168/80   Pulse: 69   Resp: 17   Temp: 97.9 F (36.6 C)   SpO2: 93%    Vitals:   03/11/20 0510 03/11/20 1547 03/12/20 0451 03/12/20 1127  BP: (!) 177/77 (!) 165/68 (!) 158/61 (!) 168/80  Pulse: (!) 51  69   Resp: 16  17   Temp: 98.8 F (37.1 C)  97.9 F (36.6 C)   TempSrc: Oral  Oral   SpO2: 96%  93%   Weight:      Height:        General: Pt is alert, awake, not in acute distress Cardiovascular: RRR, S1/S2 +, no rubs, no gallops Respiratory: CTA bilaterally, no wheezing, no rhonchi Abdominal: Soft, NT, ND, bowel sounds + Extremities: no edema, no cyanosis    The results of significant diagnostics from this hospitalization (including imaging, microbiology, ancillary and laboratory) are listed below for reference.     Microbiology: Recent Results (from the past 240 hour(s))  Respiratory Panel by RT PCR (Flu A&B, Covid) - Nasopharyngeal Swab     Status: None   Collection Time: 03/10/20  9:19 AM   Specimen: Nasopharyngeal Swab  Result Value Ref Range Status   SARS Coronavirus 2 by RT PCR NEGATIVE NEGATIVE Final    Comment: (NOTE) SARS-CoV-2 target nucleic acids are NOT DETECTED.  The SARS-CoV-2 RNA is generally detectable in upper respiratoy specimens during the acute phase of infection. The lowest concentration of SARS-CoV-2 viral copies this assay can detect is 131 copies/mL. A negative result does not preclude SARS-Cov-2 infection and should not be used as the sole basis for treatment or other patient management decisions. A negative result may occur with  improper specimen collection/handling, submission of specimen other than nasopharyngeal swab, presence of viral mutation(s) within  the areas targeted by this assay, and inadequate number of viral copies (<131 copies/mL). A negative result must be combined with clinical observations, patient history, and epidemiological information. The expected result is Negative.  Fact Sheet for Patients:   https://www.moore.com/  Fact Sheet for Healthcare Providers:  https://www.young.biz/  This test is no t yet approved or cleared by the [place] FDA and  has been authorized for detection and/or diagnosis of SARS-CoV-2 by FDA under an Emergency Use Authorization (EUA). This EUA will remain  in effect (meaning this test can be used) for the duration of the COVID-19 declaration under Section 564(b)(1) of the Act, 21 U.S.C. section 360bbb-3(b)(1), unless the authorization is terminated or revoked sooner.     Influenza A by PCR NEGATIVE NEGATIVE Final   Influenza B by PCR NEGATIVE NEGATIVE Final    Comment: (NOTE) The Xpert Xpress SARS-CoV-2/FLU/RSV assay is intended as an aid in  the diagnosis of influenza from Nasopharyngeal swab specimens and  should not be used as a sole basis for treatment. Nasal washings and  aspirates are unacceptable for Xpert Xpress SARS-CoV-2/FLU/RSV  testing.  Fact Sheet for Patients: https://www.moore.com/  Fact Sheet for Healthcare Providers: https://www.young.biz/  This test is not yet approved or cleared by the [place] FDA and  has been authorized for detection and/or diagnosis of SARS-CoV-2 by  FDA under an Emergency Use Authorization (EUA). This EUA will remain  in effect (meaning this test can be used) for the duration of the  Covid-19 declaration under Section 564(b)(1) of the Act, 21  U.S.C. section 360bbb-3(b)(1), unless the authorization is  terminated or revoked. Performed at [facility], [address] [place], [place] [address]      Labs: BNP (last 3 results) No results for input(s): BNP in the last 8760 hours. Basic Metabolic Panel: Recent Labs  Lab 03/07/20 1109 03/10/20 0737  NA 135 127*  K 5.3* 3.7  CL 101 90*  CO2 24 26  GLUCOSE 126* 131*  BUN 30* 29*  CREATININE 2.25* 1.76*  CALCIUM 9.2 9.0   Liver Function Tests: Recent  Labs  Lab 03/10/20 0737  AST 24  ALT 16  ALKPHOS 64  BILITOT 1.5*  PROT 6.6  ALBUMIN 3.6   No results for input(s): LIPASE, AMYLASE in the last 168 hours. No results for input(s): AMMONIA in the last 168 hours. CBC: Recent Labs  Lab 03/07/20 1109 03/10/20 0737  WBC 8.8 17.2*  HGB 11.7* 12.0*  HCT 36.6* 35.8*  MCV 98.1 94.0  PLT 300 311   Cardiac Enzymes: No results for input(s): CKTOTAL, CKMB, CKMBINDEX, TROPONINI in the last 168 hours. BNP: Invalid input(s): POCBNP CBG: Recent Labs  Lab 03/10/20 0940  GLUCAP 128*   D-Dimer No results for input(s): DDIMER in the last 72 hours. Hgb A1c No results for input(s): HGBA1C in the last 72 hours. Lipid Profile No results for input(s): CHOL, HDL, LDLCALC, TRIG, CHOLHDL, LDLDIRECT in the last 72 hours. Thyroid function studies Recent Labs    03/10/20 1230  TSH 2.739   Anemia work up No results for input(s): VITAMINB12, FOLATE, FERRITIN, TIBC, IRON, RETICCTPCT in the last 72 hours. Urinalysis    Component Value Date/Time   COLORURINE STRAW (A) 03/10/2020 0919   APPEARANCEUR CLEAR 03/10/2020 0919   LABSPEC 1.011 03/10/2020 0919   PHURINE 7.0 03/10/2020 0919   GLUCOSEU 50 (A) 03/10/2020 0919   HGBUR SMALL (A) 03/10/2020 0919   BILIRUBINUR NEGATIVE 03/10/2020 0919   KETONESUR 5 (A) 03/10/2020  0919   PROTEINUR 30 (A) 03/10/2020 0919   UROBILINOGEN 0.2 06/15/2011 1119   NITRITE NEGATIVE 03/10/2020 0919   LEUKOCYTESUR NEGATIVE 03/10/2020 0919   Sepsis Labs Invalid input(s): PROCALCITONIN,  WBC,  LACTICIDVEN Microbiology Recent Results (from the past 240 hour(s))  Respiratory Panel by RT PCR (Flu A&B, Covid) - Nasopharyngeal Swab     Status: None   Collection Time: 03/10/20  9:19 AM   Specimen: Nasopharyngeal Swab  Result Value Ref Range Status   SARS Coronavirus 2 by RT PCR NEGATIVE NEGATIVE Final    Comment: (NOTE) SARS-CoV-2 target nucleic acids are NOT DETECTED.  The SARS-CoV-2 RNA is generally detectable  in upper respiratoy specimens during the acute phase of infection. The lowest concentration of SARS-CoV-2 viral copies this assay can detect is 131 copies/mL. A negative result does not preclude SARS-Cov-2 infection and should not be used as the sole basis for treatment or other patient management decisions. A negative result may occur with  improper specimen collection/handling, submission of specimen other than nasopharyngeal swab, presence of viral mutation(s) within the areas targeted by this assay, and inadequate number of viral copies (<131 copies/mL). A negative result must be combined with clinical observations, patient history, and epidemiological information. The expected result is Negative.  Fact Sheet for Patients:  https://www.moore.com/https://www.fda.gov/media/142436/download  Fact Sheet for Healthcare Providers:  https://www.young.biz/https://www.fda.gov/media/142435/download  This test is no t yet approved or cleared by the [place] FDA and  has been authorized for detection and/or diagnosis of SARS-CoV-2 by FDA under an Emergency Use Authorization (EUA). This EUA will remain  in effect (meaning this test can be used) for the duration of the COVID-19 declaration under Section 564(b)(1) of the Act, 21 U.S.C. section 360bbb-3(b)(1), unless the authorization is terminated or revoked sooner.     Influenza A by PCR NEGATIVE NEGATIVE Final   Influenza B by PCR NEGATIVE NEGATIVE Final    Comment: (NOTE) The Xpert Xpress SARS-CoV-2/FLU/RSV assay is intended as an aid in  the diagnosis of influenza from Nasopharyngeal swab specimens and  should not be used as a sole basis for treatment. Nasal washings and  aspirates are unacceptable for Xpert Xpress SARS-CoV-2/FLU/RSV  testing.  Fact Sheet for Patients: https://www.moore.com/https://www.fda.gov/media/142436/download  Fact Sheet for Healthcare Providers: https://www.young.biz/https://www.fda.gov/media/142435/download  This test is not yet approved or cleared by the [place] FDA and  has been  authorized for detection and/or diagnosis of SARS-CoV-2 by  FDA under an Emergency Use Authorization (EUA). This EUA will remain  in effect (meaning this test can be used) for the duration of the  Covid-19 declaration under Section 564(b)(1) of the Act, 21  U.S.C. section 360bbb-3(b)(1), unless the authorization is  terminated or revoked. Performed at [facility], [address] [place], [place] [address]      Time coordinating discharge: 39 minutes  SIGNED:   Alwyn RenElizabeth G Harutyun Monteverde, MD  [facility] 03/12/2020, 1:33 PM

## 2020-03-12 NOTE — Progress Notes (Signed)
PROGRESS NOTE    Leon Morris  FXO:[id number] DOB: 1922/09/29 DOA: 03/10/2020 PCP: Leon Balo, DO   Brief Narrative: Leon Morris Showfetyis a 86 y.o.malewith medical history significant ofCAD; HTN; and HLD presenting with AMS.His daughter reports fall due to dizziness on Friday, may have hit his head. He left without being seen after almost 8 hours. Over the weekend, he has been extremely dizzy - has intermittent dizzy spells over the last few months, much milder and taking Meclizine for that. His BP has been high and he has been extremely confused. He doesn't remember things. He was found on the floor this AM. She doesn't know how he got on the floor and doesn't know if he hit his head. He has not been having cough. No apparent aspiration. Increased belching with intermittent nausea.No fever. No apparent SOB. He has been very fatigued. "It's like someone flipped a switch" that made him confused. His BP was 190/140 this AM which is markedly abnormal for him, as he tend to run low.  ED Course:Dizziness and fall last Friday, left without being seen. Increased confusion over the weekend. Found down this AM. Head CT with acute subdural hygroma on CT, neurosurgery doesn't see need for intervention. Also has PNA? Assessment & Plan:   Principal Problem:   AMS (altered mental status) Active Problems:   Hypertension   Hyperlipidemia   Falls frequently   Abnormal chest x-ray   Dysphagia   Stage 3b chronic kidney disease (HCC)   DNR (do not resuscitate)   Subdural hygroma   Acute CVA (cerebrovascular accident) (HCC)   Community acquired pneumonia of left lower lobe of lung   Palliative care encounter   Terminal care   #1 acute encephalopathy secondary to multiple strokes with evidence of hemorrhagic conversion.  Holding any anticoagulation at this time.  Initially plan was to discharge him home with hospice but later upon further discussion with hospice and family  they decided to discharge patient [facility].  They do not have a bed today hopefully he can be discharged to [facility] tomorrow.  #2 DNR on admission  #3 hypertensive emergency on admission continue hydralazine and Norvasc  #4 hyperlipidemia on Crestor  #5 stage IIIb CKD stable         Estimated body mass index is 21.76 kg/m as calculated from the following:   Height as of this encounter: 5\' 4"  (1.626 m).   Weight as of this encounter: 57.5 kg.  DVT prophylaxis: SCD Code Status: DO NOT RESUSCITATE  family Communication: Discussed with son [name] disposition Plan:  Status is: Inpatient  Dispo: The patient is from: Home              Anticipated d/c is to: [facility]              Anticipated d/c date is: 1 day              Patient currently patient does not have a safe discharge to home, patient will be discharged to [facility] tomorrow.  His daughter is not able to care for him at home. Consultants: None  Procedures none Antimicrobials: None Subjective: He is resting in bed he is awake and alert anxious to go home  Objective: Vitals:   03/11/20 0510 03/11/20 1547 03/12/20 0451 03/12/20 1127  BP: (!) 177/77 (!) 165/68 (!) 158/61 (!) 168/80  Pulse: (!) 51  69   Resp: 16  17   Temp: 98.8 F (37.1 C)  97.9 F (36.6 C)   TempSrc: Oral  Oral   SpO2: 96%  93%   Weight:      Height:        Intake/Output Summary (Last 24 hours) at 03/12/2020 1346 Last data filed at 03/11/2020 1630 Gross per 24 hour  Intake 237 ml  Output --  Net 237 ml   Filed Weights   03/10/20 0907  Weight: 57.5 kg    Examination:  General exam: Appears calm and comfortable  Respiratory system: Clear to auscultation. Respiratory effort normal. Cardiovascular system: S1 & S2 heard, RRR. No JVD, murmurs, rubs, gallops or clicks. No pedal edema. Gastrointestinal system: Abdomen is nondistended, soft and nontender. No organomegaly or masses felt. Normal bowel sounds  heard. Central nervous system: Alert and oriented. No focal neurological deficits. Extremities: Symmetric 5 x 5 power. Skin: No rashes, lesions or ulcers Psychiatry: Judgement and insight appear normal. Mood & affect appropriate.     Data Reviewed: I have personally reviewed following labs and imaging studies  CBC: Recent Labs  Lab 03/07/20 1109 03/10/20 0737  WBC 8.8 17.2*  HGB 11.7* 12.0*  HCT 36.6* 35.8*  MCV 98.1 94.0  PLT 300 311   Basic Metabolic Panel: Recent Labs  Lab 03/07/20 1109 03/10/20 0737  NA 135 127*  K 5.3* 3.7  CL 101 90*  CO2 24 26  GLUCOSE 126* 131*  BUN 30* 29*  CREATININE 2.25* 1.76*  CALCIUM 9.2 9.0   GFR: Estimated Creatinine Clearance: 20.4 mL/min (A) (by C-G formula based on SCr of 1.76 mg/dL (H)). Liver Function Tests: Recent Labs  Lab 03/10/20 0737  AST 24  ALT 16  ALKPHOS 64  BILITOT 1.5*  PROT 6.6  ALBUMIN 3.6   No results for input(s): LIPASE, AMYLASE in the last 168 hours. No results for input(s): AMMONIA in the last 168 hours. Coagulation Profile: No results for input(s): INR, PROTIME in the last 168 hours. Cardiac Enzymes: No results for input(s): CKTOTAL, CKMB, CKMBINDEX, TROPONINI in the last 168 hours. BNP (last 3 results) No results for input(s): PROBNP in the last 8760 hours. HbA1C: No results for input(s): HGBA1C in the last 72 hours. CBG: Recent Labs  Lab 03/10/20 0940  GLUCAP 128*   Lipid Profile: No results for input(s): CHOL, HDL, LDLCALC, TRIG, CHOLHDL, LDLDIRECT in the last 72 hours. Thyroid Function Tests: Recent Labs    03/10/20 1230  TSH 2.739   Anemia Panel: No results for input(s): VITAMINB12, FOLATE, FERRITIN, TIBC, IRON, RETICCTPCT in the last 72 hours. Sepsis Labs: Recent Labs  Lab 03/10/20 1430  PROCALCITON <0.10    Recent Results (from the past 240 hour(s))  Respiratory Panel by RT PCR (Flu A&B, Covid) - Nasopharyngeal Swab     Status: None   Collection Time: 03/10/20  9:19 AM    Specimen: Nasopharyngeal Swab  Result Value Ref Range Status   SARS Coronavirus 2 by RT PCR NEGATIVE NEGATIVE Final    Comment: (NOTE) SARS-CoV-2 target nucleic acids are NOT DETECTED.  The SARS-CoV-2 RNA is generally detectable in upper respiratoy specimens during the acute phase of infection. The lowest concentration of SARS-CoV-2 viral copies this assay can detect is 131 copies/mL. A negative result does not preclude SARS-Cov-2 infection and should not be used as the sole basis for treatment or other patient management decisions. A negative result may occur with  improper specimen collection/handling, submission of specimen other than nasopharyngeal swab, presence of viral mutation(s) within the areas targeted by this assay, and inadequate  number of viral copies (<131 copies/mL). A negative result must be combined with clinical observations, patient history, and epidemiological information. The expected result is Negative.  Fact Sheet for Patients:  https://www.moore.com/https://www.fda.gov/media/142436/download  Fact Sheet for Healthcare Providers:  https://www.young.biz/https://www.fda.gov/media/142435/download  This test is no t yet approved or cleared by the [place] FDA and  has been authorized for detection and/or diagnosis of SARS-CoV-2 by FDA under an Emergency Use Authorization (EUA). This EUA will remain  in effect (meaning this test can be used) for the duration of the COVID-19 declaration under Section 564(b)(1) of the Act, 21 U.S.C. section 360bbb-3(b)(1), unless the authorization is terminated or revoked sooner.     Influenza A by PCR NEGATIVE NEGATIVE Final   Influenza B by PCR NEGATIVE NEGATIVE Final    Comment: (NOTE) The Xpert Xpress SARS-CoV-2/FLU/RSV assay is intended as an aid in  the diagnosis of influenza from Nasopharyngeal swab specimens and  should not be used as a sole basis for treatment. Nasal washings and  aspirates are unacceptable for Xpert Xpress SARS-CoV-2/FLU/RSV   testing.  Fact Sheet for Patients: https://www.moore.com/https://www.fda.gov/media/142436/download  Fact Sheet for Healthcare Providers: https://www.young.biz/https://www.fda.gov/media/142435/download  This test is not yet approved or cleared by the [place] FDA and  has been authorized for detection and/or diagnosis of SARS-CoV-2 by  FDA under an Emergency Use Authorization (EUA). This EUA will remain  in effect (meaning this test can be used) for the duration of the  Covid-19 declaration under Section 564(b)(1) of the Act, 21  U.S.C. section 360bbb-3(b)(1), unless the authorization is  terminated or revoked. Performed at [facility], [address] [place], [place] [address]          Radiology Studies: CT CHEST WO CONTRAST  Result Date: 03/10/2020 CLINICAL DATA:  86 year old male with history of abnormal chest x-ray. Altered mental status. Multiple recent falls. EXAM: CT CHEST WITHOUT CONTRAST TECHNIQUE: Multidetector CT imaging of the chest was performed following the standard protocol without IV contrast. COMPARISON:  No prior chest CT.  Chest x-ray 03/10/2020. FINDINGS: Cardiovascular: Heart size is normal. There is no significant pericardial fluid, thickening or pericardial calcification. There is aortic atherosclerosis, as well as atherosclerosis of the great vessels of the mediastinum and the coronary arteries, including calcified atherosclerotic plaque in the left main, left anterior descending, left circumflex and right coronary arteries. Calcifications of the aortic valve and inferior aspect of the mitral annulus. Mediastinum/Nodes: No pathologically enlarged mediastinal or hilar lymph nodes. Please note that accurate exclusion of hilar adenopathy is limited on noncontrast CT scans. Esophagus is unremarkable in appearance. No axillary lymphadenopathy. Lungs/Pleura: Several small pulmonary nodules are noted in the lungs, largest of which is a subpleural nodule in the medial aspect of the right lower lobe  (axial image 97 of series 6) measuring 6 x 4 mm (mean diameter of 5 mm). Scattered regions of cylindrical bronchiectasis are noted in the lower lungs bilaterally. No acute consolidative airspace disease. No pleural effusions. Mild pleural thickening in the posterior aspect of the medial left hemithorax. Upper Abdomen: Aortic atherosclerosis. Left kidney is not visualized and may be surgically absent. Small partially calcified gallstone measuring 7 mm lying dependently in the gallbladder. Musculoskeletal: Chronic appearing compression fracture of T12 with 20% loss of anterior vertebral body height. There are no aggressive appearing lytic or blastic lesions noted in the visualized portions of the skeleton. IMPRESSION: 1. No acute findings are noted in the thorax. 2. Small amount of pleural thickening in the posteromedial aspect of the  lower left hemithorax. 3. Tiny pulmonary nodules measuring 5 mm or less in size, nonspecific, but statistically benign. No follow-up needed if patient is low-risk (and has no known or suspected primary neoplasm). Non-contrast chest CT can be considered in 12 months if patient is high-risk. This recommendation follows the consensus statement: Guidelines for Management of Incidental Pulmonary Nodules Detected on CT Images: From the Fleischner Society 2017; Radiology 2017; 284:228-243. 4. Aortic atherosclerosis, in addition to left main and 3 vessel coronary artery disease. 5. There are calcifications of the aortic valve and mitral annulus. Echocardiographic correlation for evaluation of potential valvular dysfunction may be warranted if clinically indicated. 6. Cholelithiasis. Aortic Atherosclerosis (ICD10-I70.0). Electronically Signed   By: Trudie Reed M.D.   On: 03/10/2020 14:47        Scheduled Meds: . amLODipine  5 mg Oral Daily  . citalopram  40 mg Oral Daily  . feeding supplement (ENSURE ENLIVE)  237 mL Oral BID BM  . hydrALAZINE  12.5 mg Oral BID   Continuous  Infusions:   LOS: 2 days     Alwyn Ren, MD 03/12/2020, 1:46 PM

## 2020-03-12 NOTE — Progress Notes (Signed)
[facility]) hospital liaison note  After more discussion pt's son [name] decision made to seek comfort care at [facility].  Unfortunately no beds are available at this time.  The liaison team will follow along and update when bed availability changes.  Thank you for the opportunity to participate in this pt's care.  Gillian Scarce, BSN, RN [facility] Liaison [phone] (24h on call)

## 2020-03-12 NOTE — Telephone Encounter (Signed)
Message given to [facility] care

## 2020-03-13 ENCOUNTER — Ambulatory Visit: Payer: Medicare PPO | Admitting: Internal Medicine

## 2020-03-13 DIAGNOSIS — R410 Disorientation, unspecified: Secondary | ICD-10-CM | POA: Diagnosis not present

## 2020-03-13 NOTE — Progress Notes (Signed)
PROGRESS NOTE    Leon Morris [facility]  JOA:[id number] DOB: 10-Oct-1922 DOA: 03/10/2020 PCP: Kermit Balo, DO   Brief Narrative: Leon Morris Showfetyis a 86 y.o.malewith medical history significant ofCAD; HTN; and HLD presenting with AMS.His daughter reports fall due to dizziness on Friday, may have hit his head. He left without being seen after almost 8 hours. Over the weekend, he has been extremely dizzy - has intermittent dizzy spells over the last few months, much milder and taking Meclizine for that. His BP has been high and he has been extremely confused. He doesn't remember things. He was found on the floor this AM. She doesn't know how he got on the floor and doesn't know if he hit his head. He has not been having cough. No apparent aspiration. Increased belching with intermittent nausea.No fever. No apparent SOB. He has been very fatigued. "It's like someone flipped a switch" that made him confused. His BP was 190/140 this AM which is markedly abnormal for him, as he tend to run low.  ED Course:Dizziness and fall last Friday, left without being seen. Increased confusion over the weekend. Found down this AM. Head CT with acute subdural hygroma on CT, neurosurgery doesn't see need for intervention. Also has PNA? Assessment & Plan:   Principal Problem:   AMS (altered mental status) Active Problems:   Hypertension   Hyperlipidemia   Falls frequently   Abnormal chest x-ray   Dysphagia   Stage 3b chronic kidney disease (HCC)   DNR (do not resuscitate)   Subdural hygroma   Acute CVA (cerebrovascular accident) (HCC)   Community acquired pneumonia of left lower lobe of lung   Palliative care encounter   Terminal care   #1 acute encephalopathy secondary to multiple strokes with evidence of hemorrhagic conversion.  Holding any anticoagulation at this time.  Initially plan was to discharge him home with hospice but later upon further discussion with hospice and family  they decided to discharge patient to [facility].  Waiting for a bed at [facility].  #2 DNR on admission  #3 hypertensive emergency on admission continue hydralazine and Norvasc  #4 hyperlipidemia on Crestor  #5 stage IIIb CKD stable    Estimated body mass index is 21.76 kg/m as calculated from the following:   Height as of this encounter: 5\' 4"  (1.626 m).   Weight as of this encounter: 57.5 kg.  DVT prophylaxis: SCD Code Status: DO NOT RESUSCITATE  family Communication: Discussed with son [name] disposition Plan:  Status is: Inpatient  Dispo: The patient is from: Home              Anticipated d/c is to: [facility] place              Anticipated d/c date is: 1 day              Patient currently patient does not have a safe discharge to home, patient will be discharged to [facility] likely tomorrow.  His daughter is not able to care for him at home. Consultants: None  Procedures none Antimicrobials: None Subjective: He is resting in bed he is awake and alert anxious to go home  Objective: Vitals:   03/12/20 0451 03/12/20 1127 03/13/20 0426 03/13/20 0920  BP: (!) 158/61 (!) 168/80 115/65 (!) 151/78  Pulse: 69  68 72  Resp: 17  16 18   Temp: 97.9 F (36.6 C)  98.3 F (36.8 C) 97.9 F (36.6 C)  TempSrc: Oral  Oral Oral  SpO2: 93%  97% 97%  Weight:      Height:        Intake/Output Summary (Last 24 hours) at 03/13/2020 1450 Last data filed at 03/13/2020 0454 Gross per 24 hour  Intake --  Output 700 ml  Net -700 ml   Filed Weights   03/10/20 0907  Weight: 57.5 kg    Examination:  General exam: Appears calm and comfortable  Respiratory system: Clear to auscultation. Respiratory effort normal. Cardiovascular system: S1 & S2 heard, RRR. No JVD, murmurs, rubs, gallops or clicks. No pedal edema. Gastrointestinal system: Abdomen is nondistended, soft and nontender. No organomegaly or masses felt. Normal bowel sounds heard. Central nervous system: Alert and oriented.  No focal neurological deficits. Extremities: Symmetric 5 x 5 power. Skin: No rashes, lesions or ulcers Psychiatry: Judgement and insight appear normal. Mood & affect appropriate.     Data Reviewed: I have personally reviewed following labs and imaging studies  CBC: Recent Labs  Lab 03/07/20 1109 03/10/20 0737  WBC 8.8 17.2*  HGB 11.7* 12.0*  HCT 36.6* 35.8*  MCV 98.1 94.0  PLT 300 311   Basic Metabolic Panel: Recent Labs  Lab 03/07/20 1109 03/10/20 0737  NA 135 127*  K 5.3* 3.7  CL 101 90*  CO2 24 26  GLUCOSE 126* 131*  BUN 30* 29*  CREATININE 2.25* 1.76*  CALCIUM 9.2 9.0   GFR: Estimated Creatinine Clearance: 20.4 mL/min (A) (by C-G formula based on SCr of 1.76 mg/dL (H)). Liver Function Tests: Recent Labs  Lab 03/10/20 0737  AST 24  ALT 16  ALKPHOS 64  BILITOT 1.5*  PROT 6.6  ALBUMIN 3.6   No results for input(s): LIPASE, AMYLASE in the last 168 hours. No results for input(s): AMMONIA in the last 168 hours. Coagulation Profile: No results for input(s): INR, PROTIME in the last 168 hours. Cardiac Enzymes: No results for input(s): CKTOTAL, CKMB, CKMBINDEX, TROPONINI in the last 168 hours. BNP (last 3 results) No results for input(s): PROBNP in the last 8760 hours. HbA1C: No results for input(s): HGBA1C in the last 72 hours. CBG: Recent Labs  Lab 03/10/20 0940  GLUCAP 128*   Lipid Profile: No results for input(s): CHOL, HDL, LDLCALC, TRIG, CHOLHDL, LDLDIRECT in the last 72 hours. Thyroid Function Tests: No results for input(s): TSH, T4TOTAL, FREET4, T3FREE, THYROIDAB in the last 72 hours. Anemia Panel: No results for input(s): VITAMINB12, FOLATE, FERRITIN, TIBC, IRON, RETICCTPCT in the last 72 hours. Sepsis Labs: Recent Labs  Lab 03/10/20 1430  PROCALCITON <0.10    Recent Results (from the past 240 hour(s))  Respiratory Panel by RT PCR (Flu A&B, Covid) - Nasopharyngeal Swab     Status: None   Collection Time: 03/10/20  9:19 AM   Specimen:  Nasopharyngeal Swab  Result Value Ref Range Status   SARS Coronavirus 2 by RT PCR NEGATIVE NEGATIVE Final    Comment: (NOTE) SARS-CoV-2 target nucleic acids are NOT DETECTED.  The SARS-CoV-2 RNA is generally detectable in upper respiratoy specimens during the acute phase of infection. The lowest concentration of SARS-CoV-2 viral copies this assay can detect is 131 copies/mL. A negative result does not preclude SARS-Cov-2 infection and should not be used as the sole basis for treatment or other patient management decisions. A negative result may occur with  improper specimen collection/handling, submission of specimen other than nasopharyngeal swab, presence of viral mutation(s) within the areas targeted by this assay, and inadequate number of viral copies (<131 copies/mL). A negative result must  be combined with clinical observations, patient history, and epidemiological information. The expected result is Negative.  Fact Sheet for Patients:  https://www.moore.com/  Fact Sheet for Healthcare Providers:  https://www.young.biz/  This test is no t yet approved or cleared by the [place] FDA and  has been authorized for detection and/or diagnosis of SARS-CoV-2 by FDA under an Emergency Use Authorization (EUA). This EUA will remain  in effect (meaning this test can be used) for the duration of the COVID-19 declaration under Section 564(b)(1) of the Act, 21 U.S.C. section 360bbb-3(b)(1), unless the authorization is terminated or revoked sooner.     Influenza A by PCR NEGATIVE NEGATIVE Final   Influenza B by PCR NEGATIVE NEGATIVE Final    Comment: (NOTE) The Xpert Xpress SARS-CoV-2/FLU/RSV assay is intended as an aid in  the diagnosis of influenza from Nasopharyngeal swab specimens and  should not be used as a sole basis for treatment. Nasal washings and  aspirates are unacceptable for Xpert Xpress SARS-CoV-2/FLU/RSV  testing.  Fact Sheet  for Patients: https://www.moore.com/  Fact Sheet for Healthcare Providers: https://www.young.biz/  This test is not yet approved or cleared by the [place] FDA and  has been authorized for detection and/or diagnosis of SARS-CoV-2 by  FDA under an Emergency Use Authorization (EUA). This EUA will remain  in effect (meaning this test can be used) for the duration of the  Covid-19 declaration under Section 564(b)(1) of the Act, 21  U.S.C. section 360bbb-3(b)(1), unless the authorization is  terminated or revoked. Performed at [facility], [address] [place], [place] [address]          Radiology Studies: No results found.      Scheduled Meds: . amLODipine  5 mg Oral Daily  . citalopram  40 mg Oral Daily  . feeding supplement (ENSURE ENLIVE)  237 mL Oral BID BM  . hydrALAZINE  12.5 mg Oral BID  . meclizine  12.5 mg Oral TID  . ondansetron  4 mg Oral TID   Or  . ondansetron (ZOFRAN) IV  4 mg Intravenous TID   Continuous Infusions:   LOS: 3 days     Alwyn Ren, MD 03/13/2020, 2:50 PM

## 2020-03-13 NOTE — Progress Notes (Signed)
PMT provider chart reviewed and patient symptom check. Upon arrival to room, Mr. Frankum appears comfortable without s/s of pain or distress. He wakes to voice and is pleasant. No family at bedside. Symptom meds on MAR. Family is now pursuing hospice facility placement. [facility] liaison following and will provide update when bed available.   NO CHARGE  Vennie Homans, DNP, FNP-C Palliative Medicine Team  Phone: [phone] Fax: [phone]

## 2020-03-13 NOTE — Social Work (Signed)
CSW received update from [place], RN liaison w/ [facility], no beds today. This was also received by [facility]. [place] to call family w/ update. Dr. Waymon Amato, physician liaison also aware.   Octavio Graves, MSW, LCSW [facility] Clinical Social Work

## 2020-03-13 NOTE — Care Management Important Message (Signed)
Important Message  Patient Details  Name: Leon Morris MRN: [id number] Date of Birth: [birth year]   Medicare Important Message Given:  Yes   Signed IM mailed to the patient home address.    Cloe Sockwell 03/13/2020, 12:44 PM

## 2020-03-14 DIAGNOSIS — R410 Disorientation, unspecified: Secondary | ICD-10-CM | POA: Diagnosis not present

## 2020-03-14 MED ORDER — AMLODIPINE BESYLATE 10 MG PO TABS
10.0000 mg | ORAL_TABLET | Freq: Every day | ORAL | Status: DC
  Administered 2020-03-15 – 2020-03-17 (×3): 10 mg via ORAL
  Filled 2020-03-14 (×3): qty 1

## 2020-03-14 NOTE — Progress Notes (Signed)
PROGRESS NOTE    [facility]  ZOX:[id number] DOB: 02/25/1923 DOA: 03/10/2020 PCP: Kermit Balo, DO   Brief Narrative: Leon Ortez Showfetyis a 86 y.o.malewith medical history significant ofCAD; HTN; and HLD presenting with AMS.His daughter reports fall due to dizziness on Friday, may have hit his head. He left without being seen after almost 8 hours. Over the weekend, he has been extremely dizzy - has intermittent dizzy spells over the last few months, much milder and taking Meclizine for that. His BP has been high and he has been extremely confused. He doesn't remember things. He was found on the floor on the day of admission. She doesn't know how he got on the floor and doesn't know if he hit his head. He has not been having cough. No apparent aspiration. Increased belching with intermittent nausea.No fever. No apparent SOB. He has been very fatigued. "It's like someone flipped a switch" that made him confused. His BP was 190/140 this AM which is markedly abnormal for him, as he tend to run low.  ED Course:Dizziness and fall last Friday, left without being seen. Increased confusion over the weekend.Head CT with acute subdural hygroma on CT, neurosurgery doesn't see need for intervention. Assessment & Plan:   Principal Problem:   AMS (altered mental status) Active Problems:   Hypertension   Hyperlipidemia   Falls frequently   Abnormal chest x-ray   Dysphagia   Stage 3b chronic kidney disease (HCC)   DNR (do not resuscitate)   Subdural hygroma   Acute CVA (cerebrovascular accident) (HCC)   Community acquired pneumonia of left lower lobe of lung   Palliative care encounter   Terminal care   #1 acute encephalopathy -resolved secondary to multiple strokes with evidence of hemorrhagic conversion.  Holding any anticoagulation at this time.  Initially plan was to discharge him home with hospice but later upon further discussion with hospice and family they  decided to discharge patient to [facility].  Waiting for a bed at [facility].  #2 DNR on admission  #3 hypertensive emergency on admission -blood pressure 156/96 on hydralazine and Norvasc.   #4 hyperlipidemia on Crestor  #5 stage IIIb CKD stable    Estimated body mass index is 21.76 kg/m as calculated from the following:   Height as of this encounter: 5\' 4"  (1.626 m).   Weight as of this encounter: 57.5 kg.  DVT prophylaxis: SCD Code Status: DO NOT RESUSCITATE  family Communication: Discussed with son [name] disposition Plan:  Status is: Inpatient  Dispo: The patient is from: Home              Anticipated d/c is to: [facility] place              Anticipated d/c date is: 1 day              Patient is medically stable to be discharged.   Consultants: None  Procedures none Antimicrobials: None Subjective: Patient resting in bed hard of hearing but awake alert oriented in no acute distress   objective: Vitals:   03/13/20 0426 03/13/20 0920 03/14/20 0523 03/14/20 1055  BP: 115/65 (!) 151/78 (!) 149/72 (!) 156/96  Pulse: 68 72 84 80  Resp: 16 18 15 18   Temp: 98.3 F (36.8 C) 97.9 F (36.6 C) 99.5 F (37.5 C) 98.6 F (37 C)  TempSrc: Oral Oral Oral Oral  SpO2: 97% 97% 97% 97%  Weight:      Height:  Intake/Output Summary (Last 24 hours) at 03/14/2020 1202 Last data filed at 03/14/2020 0545 Gross per 24 hour  Intake --  Output 750 ml  Net -750 ml   Filed Weights   03/10/20 0907  Weight: 57.5 kg    Examination:  General exam: Appears calm and comfortable  Respiratory system: Clear to auscultation. Respiratory effort normal. Cardiovascular system: S1 & S2 heard, RRR. No JVD, murmurs, rubs, gallops or clicks. No pedal edema. Gastrointestinal system: Abdomen is nondistended, soft and nontender. No organomegaly or masses felt. Normal bowel sounds heard. Central nervous system: Alert and oriented. No focal neurological deficits. Extremities: Moves all  extremities  skin: No rashes, lesions or ulcers Psychiatry: Unable to assess    Data Reviewed: I have personally reviewed following labs and imaging studies  CBC: Recent Labs  Lab 03/10/20 0737  WBC 17.2*  HGB 12.0*  HCT 35.8*  MCV 94.0  PLT 311   Basic Metabolic Panel: Recent Labs  Lab 03/10/20 0737  NA 127*  K 3.7  CL 90*  CO2 26  GLUCOSE 131*  BUN 29*  CREATININE 1.76*  CALCIUM 9.0   GFR: Estimated Creatinine Clearance: 20.4 mL/min (A) (by C-G formula based on SCr of 1.76 mg/dL (H)). Liver Function Tests: Recent Labs  Lab 03/10/20 0737  AST 24  ALT 16  ALKPHOS 64  BILITOT 1.5*  PROT 6.6  ALBUMIN 3.6   No results for input(s): LIPASE, AMYLASE in the last 168 hours. No results for input(s): AMMONIA in the last 168 hours. Coagulation Profile: No results for input(s): INR, PROTIME in the last 168 hours. Cardiac Enzymes: No results for input(s): CKTOTAL, CKMB, CKMBINDEX, TROPONINI in the last 168 hours. BNP (last 3 results) No results for input(s): PROBNP in the last 8760 hours. HbA1C: No results for input(s): HGBA1C in the last 72 hours. CBG: Recent Labs  Lab 03/10/20 0940  GLUCAP 128*   Lipid Profile: No results for input(s): CHOL, HDL, LDLCALC, TRIG, CHOLHDL, LDLDIRECT in the last 72 hours. Thyroid Function Tests: No results for input(s): TSH, T4TOTAL, FREET4, T3FREE, THYROIDAB in the last 72 hours. Anemia Panel: No results for input(s): VITAMINB12, FOLATE, FERRITIN, TIBC, IRON, RETICCTPCT in the last 72 hours. Sepsis Labs: Recent Labs  Lab 03/10/20 1430  PROCALCITON <0.10    Recent Results (from the past 240 hour(s))  Respiratory Panel by RT PCR (Flu A&B, Covid) - Nasopharyngeal Swab     Status: None   Collection Time: 03/10/20  9:19 AM   Specimen: Nasopharyngeal Swab  Result Value Ref Range Status   SARS Coronavirus 2 by RT PCR NEGATIVE NEGATIVE Final    Comment: (NOTE) SARS-CoV-2 target nucleic acids are NOT DETECTED.  The  SARS-CoV-2 RNA is generally detectable in upper respiratoy specimens during the acute phase of infection. The lowest concentration of SARS-CoV-2 viral copies this assay can detect is 131 copies/mL. A negative result does not preclude SARS-Cov-2 infection and should not be used as the sole basis for treatment or other patient management decisions. A negative result may occur with  improper specimen collection/handling, submission of specimen other than nasopharyngeal swab, presence of viral mutation(s) within the areas targeted by this assay, and inadequate number of viral copies (<131 copies/mL). A negative result must be combined with clinical observations, patient history, and epidemiological information. The expected result is Negative.  Fact Sheet for Patients:  https://www.moore.com/  Fact Sheet for Healthcare Providers:  https://www.young.biz/  This test is no t yet approved or cleared by the [place] FDA  and  has been authorized for detection and/or diagnosis of SARS-CoV-2 by FDA under an Emergency Use Authorization (EUA). This EUA will remain  in effect (meaning this test can be used) for the duration of the COVID-19 declaration under Section 564(b)(1) of the Act, 21 U.S.C. section 360bbb-3(b)(1), unless the authorization is terminated or revoked sooner.     Influenza A by PCR NEGATIVE NEGATIVE Final   Influenza B by PCR NEGATIVE NEGATIVE Final    Comment: (NOTE) The Xpert Xpress SARS-CoV-2/FLU/RSV assay is intended as an aid in  the diagnosis of influenza from Nasopharyngeal swab specimens and  should not be used as a sole basis for treatment. Nasal washings and  aspirates are unacceptable for Xpert Xpress SARS-CoV-2/FLU/RSV  testing.  Fact Sheet for Patients: https://www.moore.com/  Fact Sheet for Healthcare Providers: https://www.young.biz/  This test is not yet approved or cleared  by the [place] FDA and  has been authorized for detection and/or diagnosis of SARS-CoV-2 by  FDA under an Emergency Use Authorization (EUA). This EUA will remain  in effect (meaning this test can be used) for the duration of the  Covid-19 declaration under Section 564(b)(1) of the Act, 21  U.S.C. section 360bbb-3(b)(1), unless the authorization is  terminated or revoked. Performed at [facility], [address] [place], [place] [address]          Radiology Studies: No results found.      Scheduled Meds: . amLODipine  5 mg Oral Daily  . citalopram  40 mg Oral Daily  . feeding supplement (ENSURE ENLIVE)  237 mL Oral BID BM  . hydrALAZINE  12.5 mg Oral BID  . meclizine  12.5 mg Oral TID  . ondansetron  4 mg Oral TID   Or  . ondansetron (ZOFRAN) IV  4 mg Intravenous TID   Continuous Infusions:   LOS: 4 days     Alwyn Ren, MD 03/14/2020, 12:02 PM

## 2020-03-14 NOTE — Progress Notes (Signed)
[facility] ([facility]) Hospital Liaison note.  Unfortunately,  [facility] is unable to offer a room today. Hospital Liaison will follow up tomorrow or sooner if a room becomes available.   Please do not hesitate to call with questions.    Thank you for the opportunity to participate in this patient's care.  Chrislyn King, BSN, RN [facility] Hospital Liaison (listed on [facility] under Hospice[facility])    [phone]    (24h on call) 

## 2020-03-14 NOTE — Progress Notes (Signed)
Palliative Medicine RN Note: Went by for a symptom check earlier this afternoon. Family reports that pt c/o pain every time they touch him, and he is scowling. Chaplain at bedside; will have RN bring pain meds & PMT follow this weekend.  Margret Chance Chandlor Noecker, RN, BSN, [facility] Palliative Medicine Team 03/14/2020 3:46 PM Office [phone]

## 2020-03-14 NOTE — Plan of Care (Signed)

## 2020-03-14 NOTE — Progress Notes (Signed)
   03/14/20 1400  Clinical Encounter Type  Visited With Patient and family together  Visit Type Patient actively dying  Referral From Nurse  Spiritual Encounters  Spiritual Needs Ritual;Emotional  Stress Factors  Patient Stress Factors Major life changes  Family Stress Factors Major life changes  Visited with patient at request of nurse. Patient actively dying. Son called Chaplain's office and asked for Leon Morris to give last rights. Chaplain's office left a message for Father [name] to return call. Son is on his way from [place]. Chaplain is available when needed.

## 2020-03-15 DIAGNOSIS — Z515 Encounter for palliative care: Secondary | ICD-10-CM | POA: Diagnosis not present

## 2020-03-15 DIAGNOSIS — Z66 Do not resuscitate: Secondary | ICD-10-CM | POA: Diagnosis not present

## 2020-03-15 DIAGNOSIS — R410 Disorientation, unspecified: Secondary | ICD-10-CM | POA: Diagnosis not present

## 2020-03-15 NOTE — Progress Notes (Signed)
PROGRESS NOTE    Leon Morris [facility]  LFY:[id number] DOB: 27-May-1923 DOA: 03/10/2020 PCP: Kermit Balo, DO   Brief Narrative: Leon Morris a 86 y.o.malewith medical history significant ofCAD; HTN; and HLD presenting with AMS.His daughter reports fall due to dizziness on Friday, may have hit his head. He left without being seen after almost 8 hours. Over the weekend, he has been extremely dizzy - has intermittent dizzy spells over the last few months, much milder and taking Meclizine for that. His BP has been high and he has been extremely confused. He doesn't remember things. He was found on the floor on the day of admission. She doesn't know how he got on the floor and doesn't know if he hit his head. He has not been having cough. No apparent aspiration. Increased belching with intermittent nausea.No fever. No apparent SOB. He has been very fatigued. "It's like someone flipped a switch" that made him confused. His BP was 190/140 this AM which is markedly abnormal for him, as he tend to run low.  ED Course:Dizziness and fall last Friday, left without being seen. Increased confusion over the weekend.Head CT with acute subdural hygroma on CT, neurosurgery doesn't see need for intervention. Assessment & Plan:   Principal Problem:   AMS (altered mental status) Active Problems:   Hypertension   Hyperlipidemia   Falls frequently   Abnormal chest x-ray   Dysphagia   Stage 3b chronic kidney disease (HCC)   DNR (do not resuscitate)   Subdural hygroma   Acute CVA (cerebrovascular accident) (HCC)   Community acquired pneumonia of left lower lobe of lung   Palliative care encounter   Terminal care   #1 acute encephalopathy -resolved secondary to multiple strokes with evidence of hemorrhagic conversion.  Holding any anticoagulation at this time.  Initially plan was to discharge him home with hospice but later upon further discussion with hospice and family they  decided to discharge patient to [facility].  Waiting for a bed at [facility].  #2 DNR on admission  #3 hypertensive emergency on admission -his blood pressure is 146/86.  Continue hydralazine and Norvasc.   #4 hyperlipidemia on Crestor  #5 stage IIIb CKD stable    Estimated body mass index is 21.76 kg/m as calculated from the following:   Height as of this encounter: 5\' 4"  (1.626 m).   Weight as of this encounter: 57.5 kg.  DVT prophylaxis: SCD Code Status: DO NOT RESUSCITATE  family Communication: Discussed with son [name] disposition Plan:  Status is: Inpatient  Dispo: The patient is from: Home              Anticipated d/c is to: [facility]              Anticipated d/c date is: 1 day              Patient is medically stable to be discharged.   Consultants: None  Procedures none Antimicrobials: None Subjective: No acute events overnight patient resting in bed in no acute distress very hard of hearing he thinks he is going home soon  objective: Vitals:   03/14/20 0523 03/14/20 1055 03/14/20 1938 03/15/20 0300  BP: (!) 149/72 (!) 156/96 (!) 139/57 (!) 146/86  Pulse: 84 80 79 97  Resp: 15 18 17 17   Temp: 99.5 F (37.5 C) 98.6 F (37 C) 99 F (37.2 C) 98.4 F (36.9 C)  TempSrc: Oral Oral Oral Oral  SpO2: 97% 97% 97% 91%  Weight:  Height:       No intake or output data in the 24 hours ending 03/15/20 1107 Filed Weights   03/10/20 0907  Weight: 57.5 kg    Examination:  General exam: Appears calm and comfortable  Respiratory system: Clear to auscultation. Respiratory effort normal. Cardiovascular system: S1 & S2 heard, RRR. No JVD, murmurs, rubs, gallops or clicks. No pedal edema. Gastrointestinal system: Abdomen is nondistended, soft and nontender. No organomegaly or masses felt. Normal bowel sounds heard. Central nervous system: Alert and oriented. No focal neurological deficits. Extremities: Moves all extremities  skin: No rashes, lesions or  ulcers Psychiatry: Unable to assess    Data Reviewed: I have personally reviewed following labs and imaging studies  CBC: Recent Labs  Lab 03/10/20 0737  WBC 17.2*  HGB 12.0*  HCT 35.8*  MCV 94.0  PLT 311   Basic Metabolic Panel: Recent Labs  Lab 03/10/20 0737  NA 127*  K 3.7  CL 90*  CO2 26  GLUCOSE 131*  BUN 29*  CREATININE 1.76*  CALCIUM 9.0   GFR: Estimated Creatinine Clearance: 20.4 mL/min (A) (by C-G formula based on SCr of 1.76 mg/dL (H)). Liver Function Tests: Recent Labs  Lab 03/10/20 0737  AST 24  ALT 16  ALKPHOS 64  BILITOT 1.5*  PROT 6.6  ALBUMIN 3.6   No results for input(s): LIPASE, AMYLASE in the last 168 hours. No results for input(s): AMMONIA in the last 168 hours. Coagulation Profile: No results for input(s): INR, PROTIME in the last 168 hours. Cardiac Enzymes: No results for input(s): CKTOTAL, CKMB, CKMBINDEX, TROPONINI in the last 168 hours. BNP (last 3 results) No results for input(s): PROBNP in the last 8760 hours. HbA1C: No results for input(s): HGBA1C in the last 72 hours. CBG: Recent Labs  Lab 03/10/20 0940  GLUCAP 128*   Lipid Profile: No results for input(s): CHOL, HDL, LDLCALC, TRIG, CHOLHDL, LDLDIRECT in the last 72 hours. Thyroid Function Tests: No results for input(s): TSH, T4TOTAL, FREET4, T3FREE, THYROIDAB in the last 72 hours. Anemia Panel: No results for input(s): VITAMINB12, FOLATE, FERRITIN, TIBC, IRON, RETICCTPCT in the last 72 hours. Sepsis Labs: Recent Labs  Lab 03/10/20 1430  PROCALCITON <0.10    Recent Results (from the past 240 hour(s))  Respiratory Panel by RT PCR (Flu A&B, Covid) - Nasopharyngeal Swab     Status: None   Collection Time: 03/10/20  9:19 AM   Specimen: Nasopharyngeal Swab  Result Value Ref Range Status   SARS Coronavirus 2 by RT PCR NEGATIVE NEGATIVE Final    Comment: (NOTE) SARS-CoV-2 target nucleic acids are NOT DETECTED.  The SARS-CoV-2 RNA is generally detectable in upper  respiratoy specimens during the acute phase of infection. The lowest concentration of SARS-CoV-2 viral copies this assay can detect is 131 copies/mL. A negative result does not preclude SARS-Cov-2 infection and should not be used as the sole basis for treatment or other patient management decisions. A negative result may occur with  improper specimen collection/handling, submission of specimen other than nasopharyngeal swab, presence of viral mutation(s) within the areas targeted by this assay, and inadequate number of viral copies (<131 copies/mL). A negative result must be combined with clinical observations, patient history, and epidemiological information. The expected result is Negative.  Fact Sheet for Patients:  https://www.moore.com/  Fact Sheet for Healthcare Providers:  https://www.young.biz/  This test is no t yet approved or cleared by the [place] FDA and  has been authorized for detection and/or diagnosis of SARS-CoV-2 by  FDA under an Emergency Use Authorization (EUA). This EUA will remain  in effect (meaning this test can be used) for the duration of the COVID-19 declaration under Section 564(b)(1) of the Act, 21 U.S.C. section 360bbb-3(b)(1), unless the authorization is terminated or revoked sooner.     Influenza A by PCR NEGATIVE NEGATIVE Final   Influenza B by PCR NEGATIVE NEGATIVE Final    Comment: (NOTE) The Xpert Xpress SARS-CoV-2/FLU/RSV assay is intended as an aid in  the diagnosis of influenza from Nasopharyngeal swab specimens and  should not be used as a sole basis for treatment. Nasal washings and  aspirates are unacceptable for Xpert Xpress SARS-CoV-2/FLU/RSV  testing.  Fact Sheet for Patients: https://www.moore.com/  Fact Sheet for Healthcare Providers: https://www.young.biz/  This test is not yet approved or cleared by the [place] FDA and  has been  authorized for detection and/or diagnosis of SARS-CoV-2 by  FDA under an Emergency Use Authorization (EUA). This EUA will remain  in effect (meaning this test can be used) for the duration of the  Covid-19 declaration under Section 564(b)(1) of the Act, 21  U.S.C. section 360bbb-3(b)(1), unless the authorization is  terminated or revoked. Performed at [facility], [address], [place], [place] [address]          Radiology Studies: No results found.      Scheduled Meds: . amLODipine  10 mg Oral Daily  . citalopram  40 mg Oral Daily  . feeding supplement (ENSURE ENLIVE)  237 mL Oral BID BM  . hydrALAZINE  12.5 mg Oral BID  . meclizine  12.5 mg Oral TID  . ondansetron  4 mg Oral TID   Or  . ondansetron (ZOFRAN) IV  4 mg Intravenous TID   Continuous Infusions:   LOS: 5 days     Alwyn Ren, MD 03/15/2020, 11:07 AM

## 2020-03-15 NOTE — Progress Notes (Signed)
[facility] ([facility]) Hospital Liaison note.  Unfortunately,  [facility] is unable to offer a room today. Hospital Liaison will follow up tomorrow or sooner if a room becomes available.   Please do not hesitate to call with questions.    Thank you for the opportunity to participate in this patient's care.  Chrislyn King, BSN, RN [facility] Hospital Liaison (listed on [facility] under Hospice[facility])    [phone]    (24h on call) 

## 2020-03-15 NOTE — Plan of Care (Signed)
  Problem: Education: Goal: Knowledge of General Education information will improve Description: Including pain rating scale, medication(s)/side effects and non-pharmacologic comfort measures Outcome: Not Progressing   Problem: Health Behavior/Discharge Planning: Goal: Ability to manage health-related needs will improve Outcome: Not Progressing   Problem: Clinical Measurements: Goal: Ability to maintain clinical measurements within normal limits will improve Outcome: Not Progressing  Pt has memory impairement

## 2020-03-15 NOTE — Progress Notes (Signed)
   Palliative Medicine Inpatient Follow Up Note   Reason for Consultation: Establishing goals of care, Non pain symptom management and Psychosocial/spiritual support  HPI/Patient Profile: 86 y.o. male  with past medical history of mobitz type 2, CAD, and episodic dizziness who was admitted on 03/10/2020 after being found on the floor this morning next to his bed.  Per notes he fell last Friday and has had increased confusion over the weekend.  Today he has had increased nausea and fatigue.  His blood pressure has been very high (224/92) when it is normally low.  CXR shows LLL consolidation concerning for aspiration pneumonia.  He failed a swallow study in the ER.  MR Brain shows multiple small strokes in the right frontal, parietal, and temporal lobes - some with hemorrhagic conversion.  He has a slight midline shift.   [facility] admitting physician discussed comfort measures with his daughter Leon Morris and she was agreeable.   Today's Discussion (03/15/2020): Chart reviewed.  Leon Morris is comfortable resting in bed this morning.  He opens his eyes and responds to verbal when I enter the room.  He denies pain, shortness of breath, or nausea.  I reached out to the liaison for [facility].  There are no beds available today.  We will continue to manage Leon Morris symptoms while he remains in house.  Discussed the importance of continued conversation with family and their  medical providers regarding overall plan of care and treatment options, ensuring decisions are within the context of the patients values and GOCs.  Questions and concerns addressed   Objective Assessment: Vital Signs Vitals:   03/14/20 1938 03/15/20 0300  BP: (!) 139/57 (!) 146/86  Pulse: 79 97  Resp: 17 17  Temp: 99 F (37.2 C) 98.4 F (36.9 C)  SpO2: 97% 91%   No intake or output data in the 24 hours ending 03/15/20 1155 Last Weight  Most recent update: 03/10/2020  9:07 AM   Weight  57.5 kg (126 lb 12.2 oz)           Gen:  Elderly M in NAD HEENT: Dry mucous membranes CV: Irregular rate and rhythm, no murmurs rubs or gallops PULM: clear to auscultation bilaterally. No wheezes/rales/rhonch  ABD: soft/nontender/nondistended/normal bowel sounds  EXT: No edema  Neuro: Alert and oriented x1 - incrementally falling asleep  SUMMARY OF RECOMMENDATIONS    DNAR/DNI  Comfort focused care plan  Comfort meds on MAR.  Comfort feeds per patient/family request. Aspiration precautions.   Await a [facility] Bed  Time Spent: 15 Greater than 50% of the time was spent in counseling and coordination of care ______________________________________________________________________________________ Leon Morris [facility] Palliative Medicine Team Team Cell Phone: [phone] Please utilize secure chat with additional questions, if there is no response within 30 minutes please call the above phone number  Palliative Medicine Team providers are available by phone from 7am to 7pm daily and can be reached through the team cell phone.  Should this patient require assistance outside of these hours, please call the patient's attending physician.

## 2020-03-16 DIAGNOSIS — R410 Disorientation, unspecified: Secondary | ICD-10-CM | POA: Diagnosis not present

## 2020-03-16 DIAGNOSIS — Z66 Do not resuscitate: Secondary | ICD-10-CM | POA: Diagnosis not present

## 2020-03-16 DIAGNOSIS — Z515 Encounter for palliative care: Secondary | ICD-10-CM | POA: Diagnosis not present

## 2020-03-16 NOTE — Progress Notes (Signed)
Civil engineer, contracting ([facility]) [facility]  We do not have a bed for Mr. Gedney at either of our inpatient facilities.   Spoke with [name], son and NOK, he is anxious to get his father moved out of the hospital and was agreeable to have him placed on the list for our other facility in [place].    Will update TOC and family as soon as a bed is open at either of our facilities.  Wallis Bamberg RN, BSN, CCRN [facility] Liaison (in [place] under hospice, or Epic chat)

## 2020-03-16 NOTE — Progress Notes (Signed)
   Palliative Medicine Inpatient Follow Up Note   Reason for Consultation: Establishing goals of care, Non pain symptom management and Psychosocial/spiritual support  HPI/Patient Profile: 86 y.o. male  with past medical history of mobitz type 2, CAD, and episodic dizziness who was admitted on 03/10/2020 after being found on the floor this morning next to his bed.  Per notes he fell last Friday and has had increased confusion over the weekend.  Today he has had increased nausea and fatigue.  His blood pressure has been very high (224/92) when it is normally low.  CXR shows LLL consolidation concerning for aspiration pneumonia.  He failed a swallow study in the ER.  MR Brain shows multiple small strokes in the right frontal, parietal, and temporal lobes - some with hemorrhagic conversion.  He has a slight midline shift.   [facility] admitting physician discussed comfort measures with his daughter Leon Morris and she was agreeable.   Today's Discussion (03/16/2020): Chart reviewed.  Leon Morris is comfortable resting in bed this morning.  He is able to speak with me this morning and endorses no complaints.   There remains to be no beds at [facility].   Discussed the importance of continued conversation with family and their  medical providers regarding overall plan of care and treatment options, ensuring decisions are within the context of the patients values and GOCs.  Questions and concerns addressed   Objective Assessment: Vital Signs Vitals:   03/15/20 0300 03/16/20 0551  BP: (!) 146/86 (!) 158/78  Pulse: 97 85  Resp: 17   Temp: 98.4 F (36.9 C) 99.5 F (37.5 C)  SpO2: 91% 97%    Intake/Output Summary (Last 24 hours) at 03/16/2020 1018 Last data filed at 03/16/2020 0559 Gross per 24 hour  Intake 120 ml  Output 1350 ml  Net -1230 ml   Last Weight  Most recent update: 03/10/2020  9:07 AM   Weight  57.5 kg (126 lb 12.2 oz)           Gen: Elderly M in NAD HEENT: Dry mucous membranes CV:  Irregular rate and rhythm, no murmurs rubs or gallops PULM: clear to auscultation bilaterally. No wheezes/rales/rhonch  ABD: soft/nontender/nondistended/normal bowel sounds  EXT: No edema  Neuro: Alert and oriented x1 - incrementally falling asleep  SUMMARY OF RECOMMENDATIONS    DNAR/DNI  Comfort focused care plan  Comfort meds on MAR.  Comfort feeds per patient/family request. Aspiration precautions.   Await a [facility] Bed  Time Spent: 15 Greater than 50% of the time was spent in counseling and coordination of care ______________________________________________________________________________________ Leon Morris [facility] Palliative Medicine Team Team Cell Phone: [phone] Please utilize secure chat with additional questions, if there is no response within 30 minutes please call the above phone number  Palliative Medicine Team providers are available by phone from 7am to 7pm daily and can be reached through the team cell phone.  Should this patient require assistance outside of these hours, please call the patient's attending physician.

## 2020-03-16 NOTE — Progress Notes (Addendum)
PROGRESS NOTE    Leon Morris  XLK:[id number] DOB: 06-22-1922 DOA: 03/10/2020 PCP: Kermit Balo, DO   Brief Narrative: Leon Bonn Showfetyis a 86 y.o.malewith medical history significant ofCAD; HTN; and HLD presenting with AMS.His daughter reports fall due to dizziness on Friday, may have hit his head. He left without being seen after almost 8 hours. Over the weekend, he has been extremely dizzy - has intermittent dizzy spells over the last few months, much milder and taking Meclizine for that. His BP has been high and he has been extremely confused. He doesn't remember things. He was found on the floor on the day of admission. She doesn't know how he got on the floor and doesn't know if he hit his head. He has not been having cough. No apparent aspiration. Increased belching with intermittent nausea.No fever. No apparent SOB. He has been very fatigued. "It's like someone flipped a switch" that made him confused. His BP was 190/140 this AM which is markedly abnormal for him, as he tend to run low.  ED Course:Dizziness and fall last Friday, left without being seen. Increased confusion over the weekend.Head CT with acute subdural hygroma on CT, neurosurgery doesn't see need for intervention. Assessment & Plan:   Principal Problem:   AMS (altered mental status) Active Problems:   Hypertension   Hyperlipidemia   Falls frequently   Abnormal chest x-ray   Dysphagia   Stage 3b chronic kidney disease (HCC)   DNR (do not resuscitate)   Subdural hygroma   Acute CVA (cerebrovascular accident) (HCC)   Community acquired pneumonia of left lower lobe of lung   Palliative care encounter   Terminal care   #1 acute encephalopathy -resolved secondary to multiple strokes with evidence of hemorrhagic conversion.  Holding any anticoagulation at this time.  Initially plan was to discharge him home with hospice but later upon further discussion with hospice and family they  decided to discharge patient to [facility].  Waiting for a bed at [facility].  #2 DNR on admission  #3 hypertensive emergency on admission -his blood pressure is 146/86.  Continue hydralazine and Norvasc.   #4 hyperlipidemia on Crestor  #5 stage IIIb CKD stable    Estimated body mass index is 21.76 kg/m as calculated from the following:   Height as of this encounter: 5\' 4"  (1.626 m).   Weight as of this encounter: 57.5 kg.  DVT prophylaxis: SCD Code Status: DO NOT RESUSCITATE  family Communication: Discussed with son Leon Morris disposition Plan:  Status is: Inpatient  Dispo: The patient is from: Home              Anticipated d/c is to: [facility]              Anticipated d/c date is: 1 day              Patient is medically stable to be discharged.   Consultants: None  Procedures none Antimicrobials: None Subjective: Patient is resting in bed [facility] still do not have a bed today  objective: Vitals:   03/14/20 1055 03/14/20 1938 03/15/20 0300 03/16/20 0551  BP: (!) 156/96 (!) 139/57 (!) 146/86 (!) 158/78  Pulse: 80 79 97 85  Resp: 18 17 17    Temp: 98.6 F (37 C) 99 F (37.2 C) 98.4 F (36.9 C) 99.5 F (37.5 C)  TempSrc: Oral Oral Oral Oral  SpO2: 97% 97% 91% 97%  Weight:      Height:  Intake/Output Summary (Last 24 hours) at 03/16/2020 1219 Last data filed at 03/16/2020 0900 Gross per 24 hour  Intake 200 ml  Output 1150 ml  Net -950 ml   Filed Weights   03/10/20 0907  Weight: 57.5 kg    Examination:  General exam: Appears calm and comfortable  Respiratory system: Clear to auscultation. Respiratory effort normal. Cardiovascular system: S1 & S2 heard, RRR. No JVD, murmurs, rubs, gallops or clicks. No pedal edema. Gastrointestinal system: Abdomen is nondistended, soft and nontender. No organomegaly or masses felt. Normal bowel sounds heard. Central nervous system: Alert and oriented. No focal neurological deficits. Extremities: Moves all  extremities  skin: No rashes, lesions or ulcers Psychiatry: Unable to assess    Data Reviewed: I have personally reviewed following labs and imaging studies  CBC: Recent Labs  Lab 03/10/20 0737  WBC 17.2*  HGB 12.0*  HCT 35.8*  MCV 94.0  PLT 311   Basic Metabolic Panel: Recent Labs  Lab 03/10/20 0737  NA 127*  K 3.7  CL 90*  CO2 26  GLUCOSE 131*  BUN 29*  CREATININE 1.76*  CALCIUM 9.0   GFR: Estimated Creatinine Clearance: 20.4 mL/min (A) (by C-G formula based on SCr of 1.76 mg/dL (H)). Liver Function Tests: Recent Labs  Lab 03/10/20 0737  AST 24  ALT 16  ALKPHOS 64  BILITOT 1.5*  PROT 6.6  ALBUMIN 3.6   No results for input(s): LIPASE, AMYLASE in the last 168 hours. No results for input(s): AMMONIA in the last 168 hours. Coagulation Profile: No results for input(s): INR, PROTIME in the last 168 hours. Cardiac Enzymes: No results for input(s): CKTOTAL, CKMB, CKMBINDEX, TROPONINI in the last 168 hours. BNP (last 3 results) No results for input(s): PROBNP in the last 8760 hours. HbA1C: No results for input(s): HGBA1C in the last 72 hours. CBG: Recent Labs  Lab 03/10/20 0940  GLUCAP 128*   Lipid Profile: No results for input(s): CHOL, HDL, LDLCALC, TRIG, CHOLHDL, LDLDIRECT in the last 72 hours. Thyroid Function Tests: No results for input(s): TSH, T4TOTAL, FREET4, T3FREE, THYROIDAB in the last 72 hours. Anemia Panel: No results for input(s): VITAMINB12, FOLATE, FERRITIN, TIBC, IRON, RETICCTPCT in the last 72 hours. Sepsis Labs: Recent Labs  Lab 03/10/20 1430  PROCALCITON <0.10    Recent Results (from the past 240 hour(s))  Respiratory Panel by RT PCR (Flu A&B, Covid) - Nasopharyngeal Swab     Status: None   Collection Time: 03/10/20  9:19 AM   Specimen: Nasopharyngeal Swab  Result Value Ref Range Status   SARS Coronavirus 2 by RT PCR NEGATIVE NEGATIVE Final    Comment: (NOTE) SARS-CoV-2 target nucleic acids are NOT DETECTED.  The  SARS-CoV-2 RNA is generally detectable in upper respiratoy specimens during the acute phase of infection. The lowest concentration of SARS-CoV-2 viral copies this assay can detect is 131 copies/mL. A negative result does not preclude SARS-Cov-2 infection and should not be used as the sole basis for treatment or other patient management decisions. A negative result may occur with  improper specimen collection/handling, submission of specimen other than nasopharyngeal swab, presence of viral mutation(s) within the areas targeted by this assay, and inadequate number of viral copies (<131 copies/mL). A negative result must be combined with clinical observations, patient history, and epidemiological information. The expected result is Negative.  Fact Sheet for Patients:  https://www.moore.com/  Fact Sheet for Healthcare Providers:  https://www.young.biz/  This test is no t yet approved or cleared by the [place]  FDA and  has been authorized for detection and/or diagnosis of SARS-CoV-2 by FDA under an Emergency Use Authorization (EUA). This EUA will remain  in effect (meaning this test can be used) for the duration of the COVID-19 declaration under Section 564(b)(1) of the Act, 21 U.S.C. section 360bbb-3(b)(1), unless the authorization is terminated or revoked sooner.     Influenza A by PCR NEGATIVE NEGATIVE Final   Influenza B by PCR NEGATIVE NEGATIVE Final    Comment: (NOTE) The Xpert Xpress SARS-CoV-2/FLU/RSV assay is intended as an aid in  the diagnosis of influenza from Nasopharyngeal swab specimens and  should not be used as a sole basis for treatment. Nasal washings and  aspirates are unacceptable for Xpert Xpress SARS-CoV-2/FLU/RSV  testing.  Fact Sheet for Patients: https://www.moore.com/  Fact Sheet for Healthcare Providers: https://www.young.biz/  This test is not yet approved or cleared  by the [place] FDA and  has been authorized for detection and/or diagnosis of SARS-CoV-2 by  FDA under an Emergency Use Authorization (EUA). This EUA will remain  in effect (meaning this test can be used) for the duration of the  Covid-19 declaration under Section 564(b)(1) of the Act, 21  U.S.C. section 360bbb-3(b)(1), unless the authorization is  terminated or revoked. Performed at [facility], [address], [place], [place] [address]          Radiology Studies: No results found.      Scheduled Meds: . amLODipine  10 mg Oral Daily  . citalopram  40 mg Oral Daily  . feeding supplement (ENSURE ENLIVE)  237 mL Oral BID BM  . hydrALAZINE  12.5 mg Oral BID  . meclizine  12.5 mg Oral TID  . ondansetron  4 mg Oral TID   Or  . ondansetron (ZOFRAN) IV  4 mg Intravenous TID   Continuous Infusions:   LOS: 6 days     Alwyn Ren, MD 03/16/2020, 12:19 PM

## 2020-03-17 DIAGNOSIS — R410 Disorientation, unspecified: Secondary | ICD-10-CM | POA: Diagnosis not present

## 2020-03-17 DIAGNOSIS — Z515 Encounter for palliative care: Secondary | ICD-10-CM | POA: Diagnosis not present

## 2020-03-17 DIAGNOSIS — I639 Cerebral infarction, unspecified: Secondary | ICD-10-CM | POA: Diagnosis not present

## 2020-03-17 MED ORDER — AMLODIPINE BESYLATE 10 MG PO TABS: 10.0000 mg | ORAL_TABLET | Freq: Every day | ORAL | 2 refills | Status: AC

## 2020-03-17 NOTE — Progress Notes (Signed)
disharge to [facility] via [employer]

## 2020-03-17 NOTE — Progress Notes (Signed)
Civil engineer, contracting [facility]) Hospital Liaison note.     This patient has been accepted to transfer to [facility] today.  [facility] will notify TOC when registration paperwork has been completed to arrange transport.   RN please call report to [phone].   Thank you,     Elsie Saas, RN, CCM       [facility] Liaison (listed on AMION under Hospice/[facility])     [phone]

## 2020-03-17 NOTE — TOC Progression Note (Signed)
Transition of Care [facility]) - Progression Note    Patient Details  Name: Leon Morris MRN: [id number] Date of Birth: 1922/11/04  Transition of Care [facility]) CM/SW Contact  Erin Sons, [place] Phone Number: 03/17/2020, 11:26 AM  Clinical Narrative:     Chales Abrahams with hospice called CSW and explained no beds available at [facility] or [facility]  Expected Discharge Plan: Home w Hospice Care Barriers to Discharge:  (referral to [facility] and DME delivery)  Expected Discharge Plan and Services Expected Discharge Plan: Home w Hospice Care   Discharge Planning Services: CM Consult Post Acute Care Choice: Hospice Living arrangements for the past 2 months: Single Family Home Expected Discharge Date: 03/12/20                           [facility] Agency: [facility] [place] Date [facility] Agency Contacted: 03/11/20 Time HH Agency Contacted: 1155 Representative spoke with at [facility] Agency: Eben Burow   Social Determinants of Health (SDOH) Interventions    Readmission Risk Interventions No flowsheet data found.

## 2020-03-17 NOTE — Progress Notes (Signed)
Daily Progress Note   Patient Name: Leon Morris       Date: 03/17/2020 DOB: November 05, 1922  Age: 86 y.o. MRN#: [id number] Attending Physician: Alwyn Ren, MD Primary Care Physician: Kermit Balo, DO Admit Date: 03/10/2020  Reason for Consultation/Follow-up: symptom management   Subjective: Patient makes eye contact and greets me when I enter the room.  Denies pain.  Has no requests.  Appears comfortable. Eating sips and bites.   Assessment: 86 yo male.  Multiple small strokes.  Family wanting comfort care only and Hospice house.   Patient Profile/HPI:  86 y.o. male  with past medical history of mobitz type 2, CAD, and episodic dizziness who was admitted on 03/10/2020 after being found on the floor this morning next to his bed.  Per notes he fell last Friday and has had increased confusion over the weekend.  Today he has had increased nausea and fatigue.  His blood pressure has been very high (224/92) when it is normally low.  CXR shows LLL consolidation concerning for aspiration pneumonia.  He failed a swallow study in the ER.  MR Brain shows multiple small strokes in the right frontal, parietal, and temporal lobes - some with hemorrhagic conversion.  He has a slight midline shift.   [facility] admitting physician discussed comfort measures with his daughter [name] and she was agreeable.   Length of Stay: 7   Vital Signs: BP (!) 141/65 (BP Location: Left Arm)   Pulse 85   Temp 99.5 F (37.5 C) (Oral)   Resp 16   Ht 5\' 4"  (1.626 m)   Wt 57.5 kg   SpO2 94%   BMI 21.76 kg/m  SpO2: SpO2: 94 % O2 Device: O2 Device: Room Air O2 Flow Rate:         Palliative Assessment/Data:  20%    Palliative Care Plan    Recommendations/Plan:  Continue current measures.  Plan is for  patient to DC to [facility] today.  Code Status:  DNR  Prognosis:   < 2 weeks   Discharge Planning:  Hospice facility  Care plan was discussed with RN  Thank you for allowing the Palliative Medicine Team to assist in the care of this patient.  Total time spent:  25 min.     Greater than 50%  of this  time was spent counseling and coordinating care related to the above assessment and plan.  Norvel Richards, PA-C Palliative Medicine  Please contact Palliative MedicineTeam phone at [phone] for questions and concerns between 7 am - 7 pm.   Please see AMION for individual provider pager numbers.

## 2020-03-17 NOTE — Care Management (Signed)
Received a message from Eben Burow, patient's son Leon Morris has done paperwork and patient can be discharged to [facility] today. Nurse requesting PTAR pick up at 3 pm today. Chales Abrahams will let son [name] know. NCM attempted to call daughter [name] , no answer, left voicemail.   Ronny Flurry RN

## 2020-03-17 NOTE — Plan of Care (Signed)
  Problem: Education: Goal: Knowledge of General Education information will improve Description Including pain rating scale, medication(s)/side effects and non-pharmacologic comfort measures Outcome: Progressing   

## 2020-03-17 NOTE — Plan of Care (Signed)
  Problem: Education: Goal: Knowledge of General Education information will improve Description: Including pain rating scale, medication(s)/side effects and non-pharmacologic comfort measures Outcome: Adequate for Discharge   Problem: Health Behavior/Discharge Planning: Goal: Ability to manage health-related needs will improve Outcome: Adequate for Discharge   Problem: Clinical Measurements: Goal: Ability to maintain clinical measurements within normal limits will improve Outcome: Adequate for Discharge Goal: Will remain free from infection Outcome: Adequate for Discharge Goal: Diagnostic test results will improve Outcome: Adequate for Discharge Goal: Respiratory complications will improve Outcome: Adequate for Discharge Goal: Cardiovascular complication will be avoided Outcome: Adequate for Discharge   Problem: Activity: Goal: Risk for activity intolerance will decrease Outcome: Adequate for Discharge   Problem: Nutrition: Goal: Adequate nutrition will be maintained Outcome: Adequate for Discharge   Problem: Coping: Goal: Level of anxiety will decrease Outcome: Adequate for Discharge   Problem: Elimination: Goal: Will not experience complications related to bowel motility Outcome: Adequate for Discharge Goal: Will not experience complications related to urinary retention Outcome: Adequate for Discharge   Problem: Pain Managment: Goal: General experience of comfort will improve Outcome: Adequate for Discharge   Problem: Safety: Goal: Ability to remain free from injury will improve Outcome: Adequate for Discharge   Problem: Skin Integrity: Goal: Risk for impaired skin integrity will decrease Outcome: Adequate for Discharge   Problem: Education: Goal: Knowledge of the prescribed therapeutic regimen will improve Outcome: Adequate for Discharge   Problem: Coping: Goal: Ability to identify and develop effective coping behavior will improve Outcome: Adequate for  Discharge   Problem: Clinical Measurements: Goal: Quality of life will improve Outcome: Adequate for Discharge   Problem: Respiratory: Goal: Verbalizations of increased ease of respirations will increase Outcome: Adequate for Discharge   Problem: Role Relationship: Goal: Family's ability to cope with current situation will improve Outcome: Adequate for Discharge Goal: Ability to verbalize concerns, feelings, and thoughts to partner or family member will improve Outcome: Adequate for Discharge   Problem: Pain Management: Goal: Satisfaction with pain management regimen will improve Outcome: Adequate for Discharge   

## 2020-03-18 ENCOUNTER — Telehealth: Payer: Self-pay | Admitting: *Deleted

## 2020-03-18 NOTE — Telephone Encounter (Signed)
Kermit Balo, DO  Lilou Kneip, Beckey Downing, CMA Needs toc    Patient was Discharged to the [facility].

## 2020-03-18 NOTE — Telephone Encounter (Signed)
OK - thanks

## 2020-04-07 DEATH — deceased

## 2020-05-23 ENCOUNTER — Encounter: Payer: Self-pay | Admitting: Orthopedic Surgery

## 2020-05-26 ENCOUNTER — Ambulatory Visit: Payer: Medicare PPO | Admitting: Internal Medicine

## 2020-05-29 ENCOUNTER — Encounter (INDEPENDENT_AMBULATORY_CARE_PROVIDER_SITE_OTHER): Payer: Medicare PPO | Admitting: Ophthalmology
